# Patient Record
Sex: Female | Born: 1970 | Race: Black or African American
Health system: Southern US, Community
[De-identification: ages and names within clinical notes are randomized; demographics above are authoritative.]

## PROBLEM LIST (undated history)

## (undated) DIAGNOSIS — K5900 Constipation, unspecified: Secondary | ICD-10-CM

## (undated) DIAGNOSIS — B019 Varicella without complication: Secondary | ICD-10-CM

## (undated) DIAGNOSIS — R011 Cardiac murmur, unspecified: Secondary | ICD-10-CM

## (undated) DIAGNOSIS — K760 Fatty (change of) liver, not elsewhere classified: Secondary | ICD-10-CM

## (undated) DIAGNOSIS — M2550 Pain in unspecified joint: Secondary | ICD-10-CM

## (undated) DIAGNOSIS — L659 Nonscarring hair loss, unspecified: Secondary | ICD-10-CM

## (undated) DIAGNOSIS — G4730 Sleep apnea, unspecified: Secondary | ICD-10-CM

## (undated) DIAGNOSIS — T7840XA Allergy, unspecified, initial encounter: Secondary | ICD-10-CM

## (undated) DIAGNOSIS — M1990 Unspecified osteoarthritis, unspecified site: Secondary | ICD-10-CM

## (undated) DIAGNOSIS — K219 Gastro-esophageal reflux disease without esophagitis: Secondary | ICD-10-CM

## (undated) DIAGNOSIS — E559 Vitamin D deficiency, unspecified: Secondary | ICD-10-CM

## (undated) HISTORY — DX: Cardiac murmur, unspecified: R01.1

## (undated) HISTORY — PX: ESOPHAGOGASTRODUODENOSCOPY: SHX1529

## (undated) HISTORY — DX: Varicella without complication: B01.9

## (undated) HISTORY — DX: Pain in unspecified joint: M25.50

## (undated) HISTORY — PX: COLONOSCOPY: SHX174

## (undated) HISTORY — DX: Vitamin D deficiency, unspecified: E55.9

## (undated) HISTORY — DX: Fatty (change of) liver, not elsewhere classified: K76.0

## (undated) HISTORY — DX: Allergy, unspecified, initial encounter: T78.40XA

## (undated) HISTORY — DX: Constipation, unspecified: K59.00

---

## 2011-12-22 HISTORY — PX: CERVICAL BIOPSY  W/ LOOP ELECTRODE EXCISION: SUR135

## 2015-06-23 ENCOUNTER — Ambulatory Visit (INDEPENDENT_AMBULATORY_CARE_PROVIDER_SITE_OTHER): Payer: Self-pay | Admitting: Cardiology

## 2015-06-30 ENCOUNTER — Ambulatory Visit (INDEPENDENT_AMBULATORY_CARE_PROVIDER_SITE_OTHER): Payer: Self-pay | Admitting: Cardiovascular Disease

## 2015-10-29 ENCOUNTER — Other Ambulatory Visit: Payer: Self-pay | Admitting: Obstetrics and Gynecology

## 2017-03-22 ENCOUNTER — Encounter: Payer: Self-pay | Admitting: Nurse Practitioner

## 2017-03-22 ENCOUNTER — Ambulatory Visit (INDEPENDENT_AMBULATORY_CARE_PROVIDER_SITE_OTHER): Payer: 59 | Admitting: Nurse Practitioner

## 2017-03-22 VITALS — BP 138/88 | HR 83 | Temp 97.9°F | Ht 66.0 in | Wt 230.0 lb

## 2017-03-22 DIAGNOSIS — J209 Acute bronchitis, unspecified: Secondary | ICD-10-CM

## 2017-03-22 DIAGNOSIS — J0140 Acute pansinusitis, unspecified: Secondary | ICD-10-CM | POA: Diagnosis not present

## 2017-03-22 DIAGNOSIS — H1032 Unspecified acute conjunctivitis, left eye: Secondary | ICD-10-CM | POA: Diagnosis not present

## 2017-03-22 DIAGNOSIS — L659 Nonscarring hair loss, unspecified: Secondary | ICD-10-CM | POA: Insufficient documentation

## 2017-03-22 DIAGNOSIS — J040 Acute laryngitis: Secondary | ICD-10-CM

## 2017-03-22 DIAGNOSIS — J309 Allergic rhinitis, unspecified: Secondary | ICD-10-CM | POA: Insufficient documentation

## 2017-03-22 MED ORDER — GUAIFENESIN ER 600 MG PO TB12: 600.0000 mg | ORAL_TABLET | Freq: Two times a day (BID) | ORAL | 0 refills | Status: DC | PRN

## 2017-03-22 MED ORDER — AZITHROMYCIN 250 MG PO TABS: 250.0000 mg | ORAL_TABLET | Freq: Every day | ORAL | 0 refills | Status: DC

## 2017-03-22 MED ORDER — BENZONATATE 100 MG PO CAPS: 100.0000 mg | ORAL_CAPSULE | Freq: Three times a day (TID) | ORAL | 0 refills | Status: DC | PRN

## 2017-03-22 NOTE — Progress Notes (Signed)
Subjective:    Patient ID: Brandi Ortiz, female    DOB: 01-04-1971, 46 y.o.   MRN: [id number]  Patient presents today for establish care (new patient).  URI   This is a new problem. The current episode started in the past 7 days. The problem has been gradually worsening. Associated symptoms include congestion, coughing, ear pain, a plugged ear sensation, rhinorrhea, sinus pain, a sore throat and swollen glands. Pertinent negatives include no abdominal pain, chest pain, diarrhea, dysuria, headaches or wheezing. She has tried increased fluids and steam for the symptoms.    Immunizations: (TDAP, Hep C screen, Pneumovax, Influenza, zoster)  Health Maintenance  Topic Date Due  . HIV Screening  11/10/1987  . Pap Smear  11/09/1993  . Flu Shot  05/23/2017  . Tetanus Vaccine  02/20/2022   Diet:regular.  Weight:  Wt Readings from Last 3 Encounters:  03/22/17 230 lb (104.3 kg)    Exercise:none  Fall Risk: Fall Risk  03/22/2017  Falls in the past year? No   Home Safety:home alone.  Depression/Suicide: Depression screen [facility] 2/9 03/22/2017  Decreased Interest 0  Down, Depressed, Hopeless 0  PHQ - 2 Score 0   Pap Smear (every 3yrs for >21-29 without HPV, every 65yrs for >30-50yrs with HPV):last pap 11/2016 (normal per patient), done by GYN in [place], records requested.  Mammogram (yearly, >64yrs): last done 2017 (normal per patient), patient will schedule another with GYN in [place].  Sexual History (birth control, marital status, STD):single. Not sexually active,   Medications and allergies reviewed with patient and updated if appropriate.  Patient Active Problem List   Diagnosis Date Noted  . Allergic rhinitis 03/22/2017  . Alopecia 03/22/2017    No current outpatient prescriptions on file prior to visit.   No current facility-administered medications on file prior to visit.     Past Medical History:  Diagnosis Date  . Allergy   . Chicken pox   . Heart murmur     Past  Surgical History:  Procedure Laterality Date  . CERVICAL BIOPSY  W/ LOOP ELECTRODE EXCISION  12/22/2011    Social History   Social History  . Marital status: Single    Spouse name: N/A  . Number of children: N/A  . Years of education: N/A   Social History Main Topics  . Smoking status: Never Smoker  . Smokeless tobacco: Never Used  . Alcohol use Yes     Comment: social  . Drug use: No  . Sexual activity: Not Asked   Other Topics Concern  . None   Social History Narrative  . None    Family History  Problem Relation Age of Onset  . Alcohol abuse Father   . Hypertension Brother   . Diabetes Brother   . Diabetes Maternal Aunt   . Diabetes Maternal Uncle   . Diabetes Maternal Grandfather         Review of Systems  HENT: Positive for congestion, ear pain, rhinorrhea, sinus pain and sore throat.   Respiratory: Positive for cough. Negative for wheezing.   Cardiovascular: Negative for chest pain.  Gastrointestinal: Negative for abdominal pain, constipation, diarrhea and heartburn.  Genitourinary: Negative for dysuria, frequency and urgency.  Neurological: Negative for dizziness and headaches.  Psychiatric/Behavioral: Negative for depression, hallucinations, substance abuse and suicidal ideas. The patient is not nervous/anxious and does not have insomnia.     Objective:   Vitals:   03/22/17 1416  BP: 138/88  Pulse: 83  Temp: 97.9 F (36.6  C)    Body mass index is 37.12 kg/m.   Physical Examination:  Physical Exam  Constitutional: She is oriented to person, place, and time and well-developed, well-nourished, and in no distress. No distress.  HENT:  Right Ear: Tympanic membrane, external ear and ear canal normal.  Left Ear: Tympanic membrane, external ear and ear canal normal.  Nose: Mucosal edema and rhinorrhea present. Right sinus exhibits maxillary sinus tenderness and frontal sinus tenderness. Left sinus exhibits maxillary sinus tenderness and frontal  sinus tenderness.  Mouth/Throat: Uvula is midline. No trismus in the jaw. Posterior oropharyngeal erythema present. No oropharyngeal exudate.  Eyes: Conjunctivae and EOM are normal. Pupils are equal, round, and reactive to light. No scleral icterus.  Neck: Normal range of motion. Neck supple. No thyromegaly present.  Cardiovascular: Normal rate, normal heart sounds and intact distal pulses.   Pulmonary/Chest: Effort normal and breath sounds normal. She exhibits no tenderness.  Musculoskeletal: Normal range of motion. She exhibits no edema or tenderness.  Lymphadenopathy:    She has cervical adenopathy.  Neurological: She is alert and oriented to person, place, and time. Gait normal.  Skin: Skin is warm and dry.  Psychiatric: Affect and judgment normal.    ASSESSMENT and PLAN:  Brandi Ortiz was seen today for establish care.  Diagnoses and all orders for this visit:  Acute non-recurrent pansinusitis -     guaiFENesin (MUCINEX) 600 MG 12 hr tablet; Take 1 tablet (600 mg total) by mouth 2 (two) times daily as needed for cough or to loosen phlegm. -     benzonatate (TESSALON) 100 MG capsule; Take 1 capsule (100 mg total) by mouth 3 (three) times daily as needed for cough. -     azithromycin (ZITHROMAX Z-PAK) 250 MG tablet; Take 1 tablet (250 mg total) by mouth daily. Take 2tabs on first day, then 1tab once a day till complete  Acute laryngitis -     guaiFENesin (MUCINEX) 600 MG 12 hr tablet; Take 1 tablet (600 mg total) by mouth 2 (two) times daily as needed for cough or to loosen phlegm. -     benzonatate (TESSALON) 100 MG capsule; Take 1 capsule (100 mg total) by mouth 3 (three) times daily as needed for cough. -     azithromycin (ZITHROMAX Z-PAK) 250 MG tablet; Take 1 tablet (250 mg total) by mouth daily. Take 2tabs on first day, then 1tab once a day till complete  Acute bronchitis, unspecified organism -     guaiFENesin (MUCINEX) 600 MG 12 hr tablet; Take 1 tablet (600 mg total) by mouth 2  (two) times daily as needed for cough or to loosen phlegm. -     benzonatate (TESSALON) 100 MG capsule; Take 1 capsule (100 mg total) by mouth 3 (three) times daily as needed for cough. -     azithromycin (ZITHROMAX Z-PAK) 250 MG tablet; Take 1 tablet (250 mg total) by mouth daily. Take 2tabs on first day, then 1tab once a day till complete  Acute conjunctivitis of left eye, unspecified acute conjunctivitis type   No problem-specific Assessment & Plan notes found for this encounter.     Follow up: Return if symptoms worsen or fail to improve.  Wilfred Lacy, NP

## 2017-03-22 NOTE — Patient Instructions (Addendum)
Please sign medical release to get records  And labs from GYN and dermatologist.  URI Instructions:  Encourage adequate oral hydration.  Use over-the-counter  "cold" medicines  such as "Tylenol cold" , "Advil cold",  "Mucinex" or" Mucinex D"  for cough and congestion.  Avoid decongestants if you have high blood pressure. Use" Delsym" or" Robitussin" cough syrup varietis for cough.  You can use plain "Tylenol" or "Advi"l for fever, chills and achyness.  Laryngitis Laryngitis is swelling (inflammation) of your vocal cords. This causes hoarseness, coughing, loss of voice, sore throat, or a dry throat. When your vocal cords are inflamed, your voice sounds different. Laryngitis can be temporary (acute) or long-term (chronic). Most cases of acute laryngitis improve with time. Chronic laryngitis is laryngitis that lasts for more than three weeks. Follow these instructions at home:  Drink enough fluid to keep your pee (urine) clear or pale yellow.  Breathe in moist air. Use a humidifier if you live in a dry climate.  Take medicines only as told by your doctor.  Do not smoke cigarettes or electronic cigarettes. If you need help quitting, ask your doctor.  Talk as little as possible. Also avoid whispering, which can cause vocal strain.  Write instead of talking. Do this until your voice is back to normal. Contact a doctor if:  You have a fever.  Your pain is worse.  You have trouble swallowing. Get help right away if:  You cough up blood.  You have trouble breathing. This information is not intended to replace advice given to you by your health care provider. Make sure you discuss any questions you have with your health care provider. Document Released: 09/28/2011 Document Revised: 03/16/2016 Document Reviewed: 03/24/2014 Elsevier Interactive Patient Education  [employer].

## 2017-03-30 ENCOUNTER — Telehealth: Payer: Self-pay | Admitting: Nurse Practitioner

## 2017-03-30 NOTE — Telephone Encounter (Signed)
ROI Fax to [facility]

## 2017-04-12 ENCOUNTER — Telehealth: Payer: Self-pay | Admitting: Nurse Practitioner

## 2017-04-12 NOTE — Telephone Encounter (Signed)
ROI faxed to [facility]

## 2017-04-19 NOTE — Telephone Encounter (Signed)
Rec'd from [facility] forward 13 pages to [employer] NP

## 2017-12-04 ENCOUNTER — Telehealth: Payer: Self-pay

## 2017-12-04 NOTE — Telephone Encounter (Signed)
Pt is requesting to change from Brandi Lacy, NP to Dr. Jonetta Osgood advise/thx dmf   Copied from [place] [phone]. Topic: Appointment Scheduling - Prior Auth Required for Appointment >> Dec 04, 2017  8:47 AM [name], Helene Kelp Jeral Zick wrote: No appointment has been scheduled. Patient is requesting a transfer appointment from [facility] to Dr. Juleen China. Per scheduling protocol, this appointment requires a prior authorization prior to scheduling. Please call patient back, thanks. Route to department's PEC pool.

## 2017-12-04 NOTE — Telephone Encounter (Signed)
Please help call pt and schedule an appt.

## 2017-12-04 NOTE — Telephone Encounter (Signed)
Okay 

## 2017-12-04 NOTE — Telephone Encounter (Signed)
Oh ok, I misread the previous message. Ok to transfer

## 2017-12-04 NOTE — Telephone Encounter (Signed)
Pt is requesting to transfer from [facility] to Dr. Juleen China.  Last office visit with [facility] was 02/2017. Both provider please advise, ok to transfer?

## 2017-12-04 NOTE — Telephone Encounter (Signed)
She already saw me at the [facility] location 02/2017.i do not see that she was a patient of Dr. Juleen China. Am I missing something?

## 2018-03-05 ENCOUNTER — Other Ambulatory Visit: Payer: Self-pay | Admitting: Obstetrics and Gynecology

## 2018-04-15 ENCOUNTER — Ambulatory Visit (INDEPENDENT_AMBULATORY_CARE_PROVIDER_SITE_OTHER): Payer: POS | Admitting: Certified Nurse Midwife

## 2018-04-15 ENCOUNTER — Encounter: Payer: Self-pay | Admitting: Certified Nurse Midwife

## 2018-04-15 VITALS — BP 123/90 | HR 72 | Ht 66.0 in | Wt 240.8 lb

## 2018-04-15 DIAGNOSIS — N760 Acute vaginitis: Secondary | ICD-10-CM | POA: Diagnosis not present

## 2018-04-15 DIAGNOSIS — Z1151 Encounter for screening for human papillomavirus (HPV): Secondary | ICD-10-CM

## 2018-04-15 DIAGNOSIS — Z124 Encounter for screening for malignant neoplasm of cervix: Secondary | ICD-10-CM | POA: Diagnosis not present

## 2018-04-15 DIAGNOSIS — D259 Leiomyoma of uterus, unspecified: Secondary | ICD-10-CM | POA: Insufficient documentation

## 2018-04-15 DIAGNOSIS — Z01419 Encounter for gynecological examination (general) (routine) without abnormal findings: Secondary | ICD-10-CM

## 2018-04-15 DIAGNOSIS — Z113 Encounter for screening for infections with a predominantly sexual mode of transmission: Secondary | ICD-10-CM | POA: Diagnosis not present

## 2018-04-15 DIAGNOSIS — D252 Subserosal leiomyoma of uterus: Secondary | ICD-10-CM

## 2018-04-15 DIAGNOSIS — D250 Submucous leiomyoma of uterus: Secondary | ICD-10-CM

## 2018-04-15 NOTE — Progress Notes (Signed)
Subjective:        Brandi Ortiz is a 47 y.o. female here for a routine exam.  Current complaints: irregular menses, usually has 2-3 cycles per year.  Amenorrhea since January 2019 until last week when she had another period.  Does have a history of fibroids.  Not currently sexually active.  Desires well woman exam.  Reports change in vaginal discharge.  States that her cycles are lasting about 7 days.  Denies menorrhagia or dysmenorrhea.  Desires STD screening exam.    Last TVUS results reviewed in chart from 2017.  Denies hot flashes.  Does have a dermatology exam this afternoon.  Is moving today for 1 year to [place].  Was living here and commuting to [facility].  Personal health questionnaire:  Is patient Ashkenazi Jewish, have a family history of breast and/or ovarian cancer: no Is there a family history of uterine cancer diagnosed at age < 27, gastrointestinal cancer, urinary tract cancer, family member who is a Field seismologist syndrome-associated carrier: no Is the patient overweight and hypertensive, family history of diabetes, personal history of gestational diabetes, preeclampsia or PCOS: obesity Is patient over 27, have PCOS,  family history of premature CHD under age 14, diabetes, smoke, have hypertension or peripheral artery disease:  no At any time, has a partner hit, kicked or otherwise hurt or frightened you?: no Over the past 2 weeks, have you felt down, depressed or hopeless?: no Over the past 2 weeks, have you felt little interest or pleasure in doing things?:no   Gynecologic History Patient's last menstrual period was 04/05/2018 (exact date). Contraception: abstinence Last Pap: 2017. Results were: normal Last mammogram: 2 weeks ago per patient, with normal results ?[facility].   Obstetric History OB History  Gravida Para Term Preterm AB Living  1       1    SAB TAB Ectopic Multiple Live Births  1            # Outcome Date GA Lbr Len/2nd Weight Sex Delivery Anes PTL Lv   1 SAB 2009            Past Medical History:  Diagnosis Date  . Allergy   . Chicken pox   . Heart murmur     Past Surgical History:  Procedure Laterality Date  . CERVICAL BIOPSY  W/ LOOP ELECTRODE EXCISION  12/22/2011     Current Outpatient Medications:  .  clotrimazole (LOTRIMIN) 1 % cream, Apply 1 application topically 2 (two) times daily., Disp: , Rfl:  .  fexofenadine (ALLEGRA) 180 MG tablet, Take 180 mg by mouth daily., Disp: , Rfl:  .  fluticasone (FLONASE) 50 MCG/ACT nasal spray, Place into both nostrils daily., Disp: , Rfl:  .  Glucosamine HCl (GLUCOSAMINE PO), Take by mouth., Disp: , Rfl:  .  tacrolimus (PROTOPIC) 0.1 % ointment, Apply topically 2 (two) times daily., Disp: , Rfl:  .  Ascorbic Acid (VITAMIN C PO), Take by mouth., Disp: , Rfl:  .  clobetasol (TEMOVATE) 0.05 % GEL, Apply topically 2 (two) times daily., Disp: , Rfl:  .  Multiple Vitamins-Minerals (MULTIVITAL PO), Take by mouth., Disp: , Rfl:  No Active Allergies  Social History   Tobacco Use  . Smoking status: Never Smoker  . Smokeless tobacco: Never Used  Substance Use Topics  . Alcohol use: Yes    Comment: social    Family History  Problem Relation Age of Onset  . Alcohol abuse Father   . Hypertension Brother   .  Diabetes Brother   . Diabetes Maternal Aunt   . Diabetes Maternal Uncle   . Diabetes Maternal Grandfather   . Diabetes Mother       Review of Systems  Constitutional: negative for fatigue and weight loss Respiratory: negative for cough and wheezing Cardiovascular: negative for chest pain, fatigue and palpitations Gastrointestinal: negative for abdominal pain and change in bowel habits Musculoskeletal:negative for myalgias Neurological: negative for gait problems and tremors Behavioral/Psych: negative for abusive relationship, depression Endocrine: negative for temperature intolerance    Genitourinary:negative for abnormal menstrual periods, genital lesions, hot flashes, sexual  problems and vaginal discharge Integument/breast: negative for breast lump, breast tenderness, nipple discharge and skin lesion(s)    Objective:       BP 123/90   Pulse 72   Ht 5\' 6"  (1.676 m)   Wt 240 lb 12.8 oz (109.2 kg)   LMP 04/05/2018 (Exact Date)   BMI 38.87 kg/m  General:   alert  Skin:   no rash or abnormalities  Lungs:   clear to auscultation bilaterally  Heart:   regular rate and rhythm, S1, S2 normal, no murmur, click, rub or gallop  Breasts:   normal without suspicious masses, skin or nipple changes or axillary nodes  Abdomen:  normal findings: no organomegaly, soft, non-tender and no hernia obtunded  Pelvis:  External genitalia: normal general appearance Urinary system: urethral meatus normal and bladder without fullness, nontender Vaginal: normal without tenderness, induration or masses Cervix: normal appearance Adnexa: normal bimanual exam Uterus: anteverted and non-tender, normal size   Lab Review Urine pregnancy test Labs reviewed yes Radiologic studies reviewed yes  50% of 30 min visit spent on counseling and coordination of care.   Assessment & Plan    Healthy female exam.    1. Well woman exam    - Cytology - PAP - Vitamin D (25 hydroxy) - CBC - TSH - Hemoglobin A1c  2. Acute vaginitis    - Cervicovaginal ancillary only  3. Screening examination for STD (sexually transmitted disease)    - HIV antibody - RPR - Hepatitis B surface antigen - Hepatitis C antibody  4. Submucous and subserous leiomyoma of uterus     5. Morbid obesity ([place])    - Hemoglobin A1c   Education reviewed: calcium supplements, depression evaluation, low fat, low cholesterol diet, safe sex/STD prevention, self breast exams, skin cancer screening and weight bearing exercise. Contraception: abstinence. Follow up in: 1 year.    Orders Placed This Encounter  Procedures  . HIV antibody  . RPR  . Hepatitis B surface antigen  . Hepatitis C antibody  . Vitamin  D (25 hydroxy)  . CBC  . TSH  . Hemoglobin A1c   Need to obtain previous records Possible management options include: surgical options for hx of fibroids if change in periods, education given to this as well.  Follow up as needed.

## 2018-04-15 NOTE — Progress Notes (Signed)
Last MMG 2 months ago, normal per pt.

## 2018-04-16 LAB — HEMOGLOBIN A1C
Est. average glucose Bld gHb Est-mCnc: 82 mg/dL
HEMOGLOBIN A1C: 4.5 % — AB (ref 4.8–5.6)

## 2018-04-16 LAB — CBC
HEMATOCRIT: 42.1 % (ref 34.0–46.6)
Hemoglobin: 13.7 g/dL (ref 11.1–15.9)
MCH: 27.8 pg (ref 26.6–33.0)
MCHC: 32.5 g/dL (ref 31.5–35.7)
MCV: 86 fL (ref 79–97)
Platelets: 236 10*3/uL (ref 150–450)
RBC: 4.92 x10E6/uL (ref 3.77–5.28)
RDW: 15.4 % (ref 12.3–15.4)
WBC: 7.5 10*3/uL (ref 3.4–10.8)

## 2018-04-16 LAB — CERVICOVAGINAL ANCILLARY ONLY
BACTERIAL VAGINITIS: POSITIVE — AB
CANDIDA VAGINITIS: NEGATIVE
Chlamydia: NEGATIVE
Neisseria Gonorrhea: NEGATIVE
TRICH (WINDOWPATH): NEGATIVE

## 2018-04-16 LAB — HEPATITIS C ANTIBODY

## 2018-04-16 LAB — VITAMIN D 25 HYDROXY (VIT D DEFICIENCY, FRACTURES): VIT D 25 HYDROXY: 29.1 ng/mL — AB (ref 30.0–100.0)

## 2018-04-16 LAB — HEPATITIS B SURFACE ANTIGEN: Hepatitis B Surface Ag: NEGATIVE

## 2018-04-16 LAB — TSH: TSH: 1.83 u[IU]/mL (ref 0.450–4.500)

## 2018-04-16 LAB — HIV ANTIBODY (ROUTINE TESTING W REFLEX): HIV Screen 4th Generation wRfx: NONREACTIVE

## 2018-04-16 LAB — RPR: RPR Ser Ql: NONREACTIVE

## 2018-04-17 ENCOUNTER — Other Ambulatory Visit: Payer: Self-pay | Admitting: Certified Nurse Midwife

## 2018-04-17 DIAGNOSIS — N760 Acute vaginitis: Principal | ICD-10-CM

## 2018-04-17 DIAGNOSIS — B9689 Other specified bacterial agents as the cause of diseases classified elsewhere: Secondary | ICD-10-CM

## 2018-04-17 DIAGNOSIS — E559 Vitamin D deficiency, unspecified: Secondary | ICD-10-CM | POA: Insufficient documentation

## 2018-04-17 LAB — CYTOLOGY - PAP
Diagnosis: NEGATIVE
HPV (WINDOPATH): NOT DETECTED

## 2018-04-17 MED ORDER — TINIDAZOLE 500 MG PO TABS: 2.0000 g | ORAL_TABLET | Freq: Every day | ORAL | 0 refills | Status: DC

## 2018-04-17 MED ORDER — VITAMIN D (ERGOCALCIFEROL) 1.25 MG (50000 UNIT) PO CAPS: 50000.0000 [IU] | ORAL_CAPSULE | ORAL | 2 refills | Status: DC

## 2018-04-18 ENCOUNTER — Other Ambulatory Visit: Payer: Self-pay | Admitting: *Deleted

## 2018-04-18 DIAGNOSIS — N760 Acute vaginitis: Principal | ICD-10-CM

## 2018-04-18 DIAGNOSIS — B9689 Other specified bacterial agents as the cause of diseases classified elsewhere: Secondary | ICD-10-CM

## 2018-04-18 DIAGNOSIS — E559 Vitamin D deficiency, unspecified: Secondary | ICD-10-CM

## 2018-04-18 MED ORDER — TINIDAZOLE 500 MG PO TABS: 2.0000 g | ORAL_TABLET | Freq: Every day | ORAL | 0 refills | Status: DC

## 2018-04-18 MED ORDER — VITAMIN D (ERGOCALCIFEROL) 1.25 MG (50000 UNIT) PO CAPS: 50000.0000 [IU] | ORAL_CAPSULE | ORAL | 2 refills | Status: DC

## 2018-04-18 NOTE — Progress Notes (Signed)
Pt called to office stating that the pharmacy that we have sent Rx's to is not out of business. Pt would like Rx's sent to another pharmacy. Rx's have been reordered.

## 2018-04-19 ENCOUNTER — Telehealth: Payer: Self-pay

## 2018-04-19 ENCOUNTER — Other Ambulatory Visit: Payer: Self-pay

## 2018-04-19 ENCOUNTER — Other Ambulatory Visit: Payer: Self-pay | Admitting: Obstetrics

## 2018-04-19 DIAGNOSIS — N760 Acute vaginitis: Secondary | ICD-10-CM

## 2018-04-19 DIAGNOSIS — B9689 Other specified bacterial agents as the cause of diseases classified elsewhere: Secondary | ICD-10-CM

## 2018-04-19 DIAGNOSIS — E559 Vitamin D deficiency, unspecified: Secondary | ICD-10-CM

## 2018-04-19 MED ORDER — TINIDAZOLE 500 MG PO TABS: 2.0000 g | ORAL_TABLET | Freq: Every day | ORAL | 0 refills | Status: DC

## 2018-04-19 MED ORDER — VITAMIN D (ERGOCALCIFEROL) 1.25 MG (50000 UNIT) PO CAPS: 50000.0000 [IU] | ORAL_CAPSULE | ORAL | 2 refills | Status: DC

## 2018-04-19 NOTE — Progress Notes (Signed)
These Rxs are from [place], and the transmission failed.  Please ask Apolonio Schneiders to call in these Rxs.  I tried to e-scribe them for her and they also failed to go through electronically.

## 2018-04-19 NOTE — Telephone Encounter (Signed)
S/w pharmacy about rx not going through, was informed that their prescriptions are being transferred to another location. Sent rx with provider approval.

## 2018-04-23 ENCOUNTER — Encounter: Payer: Self-pay | Admitting: Certified Nurse Midwife

## 2018-04-27 ENCOUNTER — Encounter: Payer: Self-pay | Admitting: Certified Nurse Midwife

## 2018-11-13 LAB — LAB REPORT - SCANNED
EGFR (African American): 85
Free T4: 6.1 ng/dL

## 2018-11-14 LAB — CBC AND DIFFERENTIAL
HCT: 41 (ref 36–46)
Hemoglobin: 14 (ref 12.0–16.0)
Neutrophils Absolute: 3255
Platelets: 234 10*3/uL (ref 150–400)
WBC: 5.7

## 2018-11-14 LAB — HEPATIC FUNCTION PANEL
ALT: 49 U/L — AB (ref 7–35)
AST: 35 (ref 13–35)
Alkaline Phosphatase: 150 — AB (ref 25–125)
Bilirubin, Direct: 0.1
Bilirubin, Total: 0.7

## 2018-11-14 LAB — LIPID PANEL
Cholesterol: 160 (ref 0–200)
HDL: 59 (ref 35–70)
LDL Cholesterol: 83
Triglycerides: 90 (ref 40–160)

## 2018-11-14 LAB — TSH: TSH: 2.02 (ref 0.41–5.90)

## 2018-11-14 LAB — CBC: RBC: 4.94 (ref 3.87–5.11)

## 2018-11-14 LAB — BASIC METABOLIC PANEL
BUN: 12 (ref 4–21)
CO2: 23 — AB (ref 13–22)
Chloride: 104 (ref 99–108)
Creatinine: 0.9 (ref 0.5–1.1)
Glucose: 103
Potassium: 4.5 meq/L (ref 3.5–5.1)
Sodium: 138 (ref 137–147)

## 2018-11-14 LAB — IRON,TIBC AND FERRITIN PANEL: Iron: 74

## 2018-11-14 LAB — COMPREHENSIVE METABOLIC PANEL
Albumin: 4.4 (ref 3.5–5.0)
Calcium: 9.1 (ref 8.7–10.7)
Globulin: 2.7
eGFR: 85

## 2018-11-14 LAB — HEMOGLOBIN A1C: Hemoglobin A1C: 4.6

## 2018-12-09 LAB — HEMOGLOBIN A1C: A1c: 4.6

## 2018-12-22 DIAGNOSIS — K219 Gastro-esophageal reflux disease without esophagitis: Secondary | ICD-10-CM | POA: Insufficient documentation

## 2018-12-22 DIAGNOSIS — R011 Cardiac murmur, unspecified: Secondary | ICD-10-CM | POA: Insufficient documentation

## 2018-12-22 DIAGNOSIS — R7989 Other specified abnormal findings of blood chemistry: Secondary | ICD-10-CM | POA: Insufficient documentation

## 2019-02-13 DIAGNOSIS — K6389 Other specified diseases of intestine: Secondary | ICD-10-CM | POA: Insufficient documentation

## 2019-02-13 DIAGNOSIS — K8020 Calculus of gallbladder without cholecystitis without obstruction: Secondary | ICD-10-CM | POA: Insufficient documentation

## 2019-02-13 DIAGNOSIS — K760 Fatty (change of) liver, not elsewhere classified: Secondary | ICD-10-CM | POA: Insufficient documentation

## 2019-04-23 ENCOUNTER — Other Ambulatory Visit: Payer: Self-pay

## 2019-04-23 ENCOUNTER — Encounter: Payer: Self-pay | Admitting: Family Medicine

## 2019-04-23 ENCOUNTER — Ambulatory Visit (INDEPENDENT_AMBULATORY_CARE_PROVIDER_SITE_OTHER): Payer: POS | Admitting: Family Medicine

## 2019-04-23 DIAGNOSIS — M79645 Pain in left finger(s): Secondary | ICD-10-CM

## 2019-04-23 MED ORDER — DICLOFENAC SODIUM 1 % TD GEL: 4.0000 g | Freq: Four times a day (QID) | TRANSDERMAL | 6 refills | Status: DC | PRN

## 2019-04-23 NOTE — Patient Instructions (Signed)
    Other things to help arthritic joints:   Turmeric 500 mg twice daily  Minimize sugar intake

## 2019-04-23 NOTE — Progress Notes (Signed)
Office Visit Note   Patient: Brandi Ortiz           Date of Birth: 1971-04-03           MRN: [id number] Visit Date: 04/23/2019 Requested by: Levin Bacon, NP [address],  [place] [address] PCP: Levin Bacon, NP  Subjective: Chief Complaint  Patient presents with  . Left Middle Finger - Pain    Knot on finger and pain with gripping/picking up objects x 2 months. NKI. Right-hand dominant.    HPI: She is a 48 year old right-hand-dominant female with left third finger pain.  She noticed it about 2 months ago, no injury.  Due to COVID-19 she has been working mainly from home.  As a result, she has had time to do a lot more yard work.  She showed me pictures of all she has done.  She wonders whether this might have contributed to her pain.  She feels a knot on the radial side of her third finger DIP joint, which is painful when she flexes her finger.  Denies any numbness, denies any triggering.  She has not taken medication for her pain because is not constant.  No previous problems with her finger.  No family history of rheumatologic disease to her knowledge.  She is otherwise been in good health.  She works for [employer].                ROS: No fevers or chills.  All other systems were reviewed and are negative.  Objective: Vital Signs: There were no vitals taken for this visit.  Physical Exam:  General:  Alert and oriented, in no acute distress. Pulm:  Breathing unlabored. Psy:  Normal mood, congruent affect. Skin: No rash Left hand: Her third finger has full flexion and extension compared to the right.  There is a palpable nodule on the radial side of the DIP joint, it does not move.  No pain with resisted strength testing of her finger, no laxity of the collateral ligaments.  Imaging: Limited diagnostic ultrasound left third finger: There is a dorsal spur at the DIP joint on the distal phalanx.  She has a small joint effusion which is more prominent on the radial  side of the joint.  I do not see a well-defined ganglion cyst.  No hyperemia on power Doppler imaging.   Assessment & Plan: 1.  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 -If symptoms persist she will come in for x-rays to confirm osteoarthritis, and then possibly be referred to hand therapy.     Procedures: No procedures performed  No notes on file     PMFS History: Patient Active Problem List   Diagnosis Date Noted  . Vitamin D deficiency 04/17/2018  . Uterine fibroid 04/15/2018  . Allergic rhinitis 03/22/2017  . Alopecia 03/22/2017   Past Medical History:  Diagnosis Date  . Allergy   . Chicken pox   . Heart murmur     Family History  Problem Relation Age of Onset  . Alcohol abuse Father   . Hypertension Brother   . Diabetes Brother   . Diabetes Maternal Aunt   . Diabetes Maternal Uncle   . Diabetes Maternal Grandfather   . Diabetes Mother     Past Surgical History:  Procedure Laterality Date  . CERVICAL  BIOPSY  W/ LOOP ELECTRODE EXCISION  12/22/2011   Social History   Occupational History  . Not on file  Tobacco Use  . Smoking status: Never Smoker  . Smokeless tobacco: Never Used  Substance and Sexual Activity  . Alcohol use: Yes    Comment: social  . Drug use: No  . Sexual activity: Yes    Birth control/protection: Condom

## 2019-05-01 ENCOUNTER — Ambulatory Visit (INDEPENDENT_AMBULATORY_CARE_PROVIDER_SITE_OTHER): Payer: POS | Admitting: Family Medicine

## 2019-05-01 ENCOUNTER — Encounter: Payer: Self-pay | Admitting: Family Medicine

## 2019-05-01 VITALS — BP 122/89 | HR 80 | Ht 66.0 in | Wt 230.3 lb

## 2019-05-01 DIAGNOSIS — L2082 Flexural eczema: Secondary | ICD-10-CM

## 2019-05-01 DIAGNOSIS — Z01419 Encounter for gynecological examination (general) (routine) without abnormal findings: Secondary | ICD-10-CM

## 2019-05-01 DIAGNOSIS — Z0000 Encounter for general adult medical examination without abnormal findings: Secondary | ICD-10-CM

## 2019-05-01 DIAGNOSIS — B354 Tinea corporis: Secondary | ICD-10-CM

## 2019-05-01 MED ORDER — KETOCONAZOLE 2 % EX CREA: TOPICAL_CREAM | Freq: Every day | CUTANEOUS | 1 refills | Status: DC | PRN

## 2019-05-01 MED ORDER — FLUTICASONE PROPIONATE 0.05 % EX CREA: TOPICAL_CREAM | Freq: Every day | CUTANEOUS | 1 refills | Status: DC | PRN

## 2019-05-01 MED ORDER — NYSTATIN 100000 UNIT/GM EX POWD: CUTANEOUS | 1 refills | Status: DC

## 2019-05-01 MED ORDER — CLOBETASOL PROPIONATE 0.05 % EX GEL: 1.0000 "application " | Freq: Every day | CUTANEOUS | 1 refills | Status: DC | PRN

## 2019-05-01 NOTE — Progress Notes (Signed)
Pt is in the office for annual. Last pap 04-15-2018. Pt reports last menstrual cycle was in June 2019.

## 2019-05-01 NOTE — Patient Instructions (Signed)
   Keep up the great work with diet and exercise!  You will need to return when you are 28 for a Pap smear and mammogram  Find a primary care doctor in the meantime to see once a year  If you have any abnormal vaginal bleeding, please return to see [place].

## 2019-05-01 NOTE — Progress Notes (Signed)
GYNECOLOGY OFFICE VISIT NOTE History:  48 y.o. G1P0010 with PMH of Vitamin D deficiency, gallbladder stones, GERD, uterine fibroid, Vitamin D deficiency and NAFLD who presents for well-woman visit. She denies any abnormal vaginal discharge, bleeding, pelvic pain or other concerns.   Healthcare Maintenance  - states last Pap normal about year ago, had co-testing and was normal - LMP 6/19 - not had any menopausal symptoms - has done mammograms in the past - started in 40s, never had abnormal, no family history of breast cancer - had job physical in January - BP, EKG, labs, A1C 4 - had lab testing recently with GI issues  - lost 20lbs with diet and exercise - running - related to NAFLD, trying to improve numbers  - has PCP but doesn't see regularly   Rash - sees a dermatologist and has used different creams in the past to help with it - itchy, worse with sweating  - tries to wear moisture-wicking clothing - uses clobetasol, fluticasone, ketoconazole - deodorant is irritating   The following portions of the patient's history were reviewed and updated as appropriate: allergies, current medications, past family history, past medical history, past social history, past surgical history and problem list.   Review of Systems:  Pertinent items noted in HPI Review of Systems  Constitutional: Negative for chills, fever and malaise/fatigue.  HENT: Negative for congestion and sore throat.   Respiratory: Negative for shortness of breath.   Cardiovascular: Negative for chest pain and leg swelling.  Gastrointestinal: Positive for constipation. Negative for abdominal pain and diarrhea.  Genitourinary: Positive for frequency. Negative for dysuria.  Skin: Positive for itching and rash.  Neurological: Negative for dizziness and headaches.  Psychiatric/Behavioral: Negative for depression. The patient is not nervous/anxious and does not have insomnia.    Objective:  Physical Exam BP 122/89   Pulse  80   Ht 5\' 6"  (1.676 m)   Wt 230 lb 4.8 oz (104.5 kg)   LMP 03/23/2018   BMI 37.17 kg/m  Physical Exam  Constitutional: She is oriented to person, place, and time. She appears well-developed and well-nourished. No distress.  HENT:  Head: Normocephalic and atraumatic.  Eyes: Conjunctivae and EOM are normal.  Neck: Neck supple. No thyromegaly present.  Cardiovascular: Normal rate and intact distal pulses.  Pulmonary/Chest: Effort normal. No respiratory distress. Right breast exhibits no inverted nipple, no mass, no nipple discharge, no skin change and no tenderness. Left breast exhibits no inverted nipple, no mass, no nipple discharge, no skin change and no tenderness. Breasts are symmetrical.  Dense, fibrous breasts but no discrete lumps palpated   Musculoskeletal:        General: No edema.  Neurological: She is alert and oriented to person, place, and time.  Skin: Skin is warm and dry. Rash (erythematous, hyperpigmented papules under arms, breasts and in groin) noted.  Psychiatric: She has a normal mood and affect. Her behavior is normal.  Nursing note and vitals reviewed.   Assessment & Plan:  48yo G1P0010 with PMH of allergic rhinitis, gallstones, GERD, NAFLD, uterine fibroid, BMI 37, vitamin D deficiency who presents for well-woman visit.   Well-Woman Visit -- Routine preventative health maintenance measures emphasized. -- Please refer to After Visit Summary for other counseling recommendations.  -- encouraged continued healthy diet and exercise -- encouraged annual flu shot -- UTD on Pap smear - discussed need in 4 years since had co-testing  -- shared decision regarding mammograms and will plan to continue screening at age 5,  normal breast exam today  -- counseled on menopause since > 1 year since LMP, advised to return for any abnormal bleeding   Tinea Corporis, Eczema -- refilled ketoconazole, fluticasone and clobetasol - counseled on use -- Rx for Nystatin powder trial    Total face-to-face time with patient: 20 minutes.  Over 50% of encounter was spent on counseling and coordination of care.  Lambert Mody. Juleen China, DO OB Family Medicine Fellow, [facility] for [employer], [place]

## 2019-05-13 ENCOUNTER — Other Ambulatory Visit: Payer: Self-pay | Admitting: *Deleted

## 2019-05-13 DIAGNOSIS — Z20822 Contact with and (suspected) exposure to covid-19: Secondary | ICD-10-CM

## 2019-05-14 ENCOUNTER — Other Ambulatory Visit: Payer: Self-pay

## 2019-05-14 DIAGNOSIS — Z20822 Contact with and (suspected) exposure to covid-19: Secondary | ICD-10-CM

## 2019-05-18 LAB — NOVEL CORONAVIRUS, NAA: SARS-CoV-2, NAA: NOT DETECTED

## 2019-06-23 ENCOUNTER — Encounter

## 2019-10-10 ENCOUNTER — Other Ambulatory Visit: Payer: Self-pay

## 2019-10-10 DIAGNOSIS — Z20822 Contact with and (suspected) exposure to covid-19: Secondary | ICD-10-CM

## 2019-10-11 LAB — NOVEL CORONAVIRUS, NAA: SARS-CoV-2, NAA: NOT DETECTED

## 2020-05-10 ENCOUNTER — Telehealth: Payer: Self-pay | Admitting: General Practice

## 2020-05-10 ENCOUNTER — Encounter: Payer: Self-pay | Admitting: General Practice

## 2020-05-10 NOTE — Telephone Encounter (Signed)
Patient called to get scheduled as new patient with Dr.Cody. I got patient scheduled and mailed out new patient ppw.

## 2020-05-23 ENCOUNTER — Other Ambulatory Visit: Payer: Self-pay

## 2020-05-23 ENCOUNTER — Ambulatory Visit
Admission: EM | Admit: 2020-05-23 | Discharge: 2020-05-23 | Disposition: A | Discharge status: Home / Self Care | Payer: POS | Attending: Emergency Medicine | Admitting: Emergency Medicine

## 2020-05-23 ENCOUNTER — Encounter: Payer: Self-pay | Admitting: Emergency Medicine

## 2020-05-23 DIAGNOSIS — N3001 Acute cystitis with hematuria: Secondary | ICD-10-CM | POA: Insufficient documentation

## 2020-05-23 DIAGNOSIS — N898 Other specified noninflammatory disorders of vagina: Secondary | ICD-10-CM | POA: Diagnosis present

## 2020-05-23 LAB — POCT URINALYSIS DIP (MANUAL ENTRY)
Bilirubin, UA: NEGATIVE
Glucose, UA: NEGATIVE mg/dL
Ketones, POC UA: NEGATIVE mg/dL
Nitrite, UA: NEGATIVE
Protein Ur, POC: 30 mg/dL — AB
Spec Grav, UA: >=1.03 — AB (ref 1.010–1.025)
Urobilinogen, UA: 0.2 E.U./dL
pH, UA: 6 (ref 5.0–8.0)

## 2020-05-23 NOTE — ED Triage Notes (Addendum)
Patient presents to [facility] for assessment of burning with urination since yesterday.  Denies blood, denies abdominal or back pain.  Vaginal pain and discharge noted since yesterday.

## 2020-05-23 NOTE — Discharge Instructions (Addendum)

## 2020-05-23 NOTE — ED Provider Notes (Signed)
[facility]    CSN: [id number] Arrival date & time: 05/23/20  6433      History   Chief Complaint Chief Complaint  Patient presents with  . Dysuria    HPI Brandi Ortiz is a 49 y.o. female with history of allergies presenting for possible UTI.  Endorsing burning with urination since yesterday.  No blood, abdominal pain, back pain, fever.  Patient also notes vaginal discharge and pain.  Does admit to rough intercourse the day preceding symptom onset.  No pelvic pain, bleeding.  Past Medical History:  Diagnosis Date  . Allergy   . Chicken pox   . Heart murmur     Patient Active Problem List   Diagnosis Date Noted  . Calculus of gallbladder without cholecystitis without obstruction 02/13/2019  . 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 Class 2 severe obesity due to excess calories with serious comorbidity and body mass index (BMI) of 38.0 to 38.9 in adult [facility]) 02/13/2019  . NAFLD (nonalcoholic fatty liver disease) 02/13/2019  . Cardiac murmur 12/22/2018  . Elevated LFTs 12/22/2018  . Gastroesophageal reflux disease 12/22/2018  . Vitamin D deficiency 04/17/2018  . Uterine fibroid 04/15/2018  . Allergic rhinitis 03/22/2017  . Alopecia 03/22/2017    Past Surgical History:  Procedure Laterality Date  . CERVICAL BIOPSY  W/ LOOP ELECTRODE EXCISION  12/22/2011    OB History    Gravida  1   Para      Term      Preterm      AB  1   Living        SAB  1   TAB      Ectopic      Multiple      Live Births               Home Medications    Prior to Admission medications   Medication Sig Start Date End Date Taking? Authorizing Provider  Omega-3 Fatty Acids (FISH OIL) 500 MG CAPS Take by mouth.   Yes [provider]  vitamin B-12 (CYANOCOBALAMIN) 500 MCG tablet Take 500 mcg by mouth daily.   Yes [provider]  Ascorbic Acid (VITAMIN C PO) Take by mouth.    [provider]  clobetasol (TEMOVATE) 0.05 % GEL Apply 1  application topically daily as needed. 05/01/19   Glenice Bow, DO  famotidine (PEPCID) 20 MG tablet Take by mouth. 04/20/19   [provider]  fexofenadine (ALLEGRA) 180 MG tablet Take 180 mg by mouth daily.    [provider]  fluticasone (CUTIVATE) 0.05 % cream Apply topically daily as needed. 05/01/19   Glenice Bow, DO  fluticasone (FLONASE) 50 MCG/ACT nasal spray Place into both nostrils daily.    [provider]  Glucosamine Sulfate (SYNOVACIN PO) Glucosamine  1 tablet daily    [provider]  Iron-Vitamin C (VITRON-C PO) Take by mouth daily.    [provider]  ketoconazole (NIZORAL) 2 % cream Apply topically daily as needed for irritation. 05/01/19   Glenice Bow, DO  nystatin (MYCOSTATIN/NYSTOP) powder Use 2-3 times per day as needed. 05/01/19   Glenice Bow, DO  Turmeric (QC TUMERIC COMPLEX) 500 MG CAPS Take by mouth 2 (two) times daily.    [provider]    Family History Family History  Problem Relation Age of Onset  . Alcohol abuse Father   . Hypertension Brother   . Diabetes Brother   . Diabetes Maternal Aunt   .  Diabetes Maternal Uncle   . Diabetes Maternal Grandfather   . Diabetes Mother     Social History Social History   Tobacco Use  . Smoking status: Never Smoker  . Smokeless tobacco: Never Used  Vaping Use  . Vaping Use: Never used  Substance Use Topics  . Alcohol use: Yes    Comment: social  . Drug use: No     Allergies   Patient has no known allergies.   Review of Systems As per HPI   Physical Exam Triage Vital Signs ED Triage Vitals  Enc Vitals Group     BP 05/23/20 0948 (!) 151/98     Pulse Rate 05/23/20 0948 68     Resp 05/23/20 0948 18     Temp 05/23/20 0948 98.4 F (36.9 C)     Temp Source 05/23/20 0948 Oral     SpO2 05/23/20 0948 97 %     Weight --      Height --      Head Circumference --      Peak Flow --      Pain Score 05/23/20 0949 3     Pain Loc --       Pain Edu? --      Excl. in [place]? --    No data found.  Updated Vital Signs BP (!) 151/98 (BP Location: Left Arm)   Pulse 68   Temp 98.4 F (36.9 C) (Oral)   Resp 18   LMP 04/05/2018 (Exact Date)   SpO2 97%   Visual Acuity Right Eye Distance:   Left Eye Distance:   Bilateral Distance:    Right Eye Near:   Left Eye Near:    Bilateral Near:     Physical Exam Constitutional:      General: She is not in acute distress. HENT:     Head: Normocephalic and atraumatic.  Eyes:     General: No scleral icterus.    Pupils: Pupils are equal, round, and reactive to light.  Cardiovascular:     Rate and Rhythm: Normal rate.  Pulmonary:     Effort: Pulmonary effort is normal.  Abdominal:     General: Bowel sounds are normal.     Palpations: Abdomen is soft.     Tenderness: There is no abdominal tenderness. There is no right CVA tenderness, left CVA tenderness or guarding.  Genitourinary:    Comments: Patient declined, self-swab performed Skin:    Coloration: Skin is not jaundiced or pale.  Neurological:     Mental Status: She is alert and oriented to person, place, and time.      UC Treatments / Results  Labs (all labs ordered are listed, but only abnormal results are displayed) Labs Reviewed  POCT URINALYSIS DIP (MANUAL ENTRY) - Abnormal; Notable for the following components:      Result Value   Clarity, UA cloudy (*)    Spec Grav, UA >=1.030 (*)    Blood, UA large (*)    Protein Ur, POC =30 (*)    Leukocytes, UA Trace (*)    All other components within normal limits  URINE CULTURE  CERVICOVAGINAL ANCILLARY ONLY    EKG   Radiology No results found.  Procedures Procedures (including critical care time)  Medications Ordered in UC Medications - No data to display  Initial Impression / Assessment and Plan / UC Course  I have reviewed the triage vital signs and the nursing notes.  Pertinent labs & imaging results that were available  during my care of the  patient were reviewed by me and considered in my medical decision making (see chart for details).     Patient afebrile, nontoxic in office today.  Urine dipstick done in office, reviewed by me: Positive for leukocytes, protein, blood.  Urine culture pending.  H&P most consistent with acute vaginitis dysuria second to rough intercourse.  Will await cytology and urine culture prior to treating.  Return precautions discussed, patient verbalized understanding and is agreeable to plan. Final Clinical Impressions(s) / UC Diagnoses   Final diagnoses:  Acute cystitis with hematuria  Vaginal discharge     Discharge Instructions     Testing for chlamydia, gonorrhea, trichomonas is pending: please look for these results on the MyChart app/website.  We will notify you if you are positive and outline treatment at that time.  Important to avoid all forms of sexual intercourse (oral, vaginal, anal) with any/all partners for the next 7 days to avoid spreading/reinfecting. Any/all sexual partners should be notified of testing/treatment today.  Return for persistent/worsening symptoms or if you develop fever, abdominal or pelvic pain, discharge, genital pain, blood in your urine, or are re-exposed to an STI.    ED Prescriptions    None     PDMP not reviewed this encounter.   [place], [place], [place] 05/23/20 1128

## 2020-05-25 LAB — URINE CULTURE: Culture: <10000 — AB

## 2020-05-25 LAB — CERVICOVAGINAL ANCILLARY ONLY
Chlamydia: NEGATIVE
Comment: NEGATIVE
Comment: NEGATIVE
Comment: NORMAL
Neisseria Gonorrhea: NEGATIVE
Trichomonas: NEGATIVE

## 2020-06-29 ENCOUNTER — Encounter: Payer: Self-pay | Admitting: Family Medicine

## 2020-07-02 ENCOUNTER — Other Ambulatory Visit: Payer: Self-pay

## 2020-07-02 ENCOUNTER — Other Ambulatory Visit: Payer: POS

## 2020-07-02 DIAGNOSIS — Z20822 Contact with and (suspected) exposure to covid-19: Secondary | ICD-10-CM

## 2020-07-05 ENCOUNTER — Other Ambulatory Visit: Payer: Self-pay

## 2020-07-05 ENCOUNTER — Encounter: Payer: Self-pay | Admitting: Family Medicine

## 2020-07-05 ENCOUNTER — Ambulatory Visit: Payer: POS | Admitting: Family Medicine

## 2020-07-05 VITALS — BP 130/80 | HR 70 | Temp 97.3°F | Ht 65.0 in | Wt 240.8 lb

## 2020-07-05 DIAGNOSIS — E559 Vitamin D deficiency, unspecified: Secondary | ICD-10-CM | POA: Diagnosis not present

## 2020-07-05 DIAGNOSIS — K219 Gastro-esophageal reflux disease without esophagitis: Secondary | ICD-10-CM

## 2020-07-05 DIAGNOSIS — Z23 Encounter for immunization: Secondary | ICD-10-CM

## 2020-07-05 DIAGNOSIS — Z6838 Body mass index (BMI) 38.0-38.9, adult: Secondary | ICD-10-CM | POA: Diagnosis not present

## 2020-07-05 DIAGNOSIS — G4484 Primary exertional headache: Secondary | ICD-10-CM | POA: Diagnosis not present

## 2020-07-05 DIAGNOSIS — L2082 Flexural eczema: Secondary | ICD-10-CM

## 2020-07-05 DIAGNOSIS — B354 Tinea corporis: Secondary | ICD-10-CM

## 2020-07-05 DIAGNOSIS — B372 Candidiasis of skin and nail: Secondary | ICD-10-CM | POA: Insufficient documentation

## 2020-07-05 DIAGNOSIS — M1990 Unspecified osteoarthritis, unspecified site: Secondary | ICD-10-CM | POA: Insufficient documentation

## 2020-07-05 DIAGNOSIS — L309 Dermatitis, unspecified: Secondary | ICD-10-CM | POA: Insufficient documentation

## 2020-07-05 LAB — NOVEL CORONAVIRUS, NAA: SARS-CoV-2, NAA: NOT DETECTED

## 2020-07-05 MED ORDER — SUMATRIPTAN SUCCINATE 25 MG PO TABS: 25.0000 mg | ORAL_TABLET | ORAL | 0 refills | Status: DC | PRN

## 2020-07-05 MED ORDER — KETOCONAZOLE 2 % EX CREA: TOPICAL_CREAM | Freq: Every day | CUTANEOUS | 1 refills | Status: DC | PRN

## 2020-07-05 MED ORDER — INDOMETHACIN 25 MG PO CAPS: 25.0000 mg | ORAL_CAPSULE | ORAL | 0 refills | Status: DC | PRN

## 2020-07-05 MED ORDER — FLUTICASONE PROPIONATE 0.05 % EX CREA: TOPICAL_CREAM | Freq: Every day | CUTANEOUS | 1 refills | Status: AC | PRN

## 2020-07-05 MED ORDER — CLOBETASOL PROPIONATE 0.05 % EX GEL: 1.0000 "application " | Freq: Every day | CUTANEOUS | 1 refills | Status: DC | PRN

## 2020-07-05 NOTE — Assessment & Plan Note (Signed)
Working to be healthy and maintain weight loss.

## 2020-07-05 NOTE — Assessment & Plan Note (Addendum)
Treated with pepcid. Controlled

## 2020-07-05 NOTE — Progress Notes (Signed)
Subjective:     Brandi Ortiz is a 49 y.o. female presenting for Establish Care and Headache     HPI  Lab Results  Component Value Date   HGBA1C 4.5 (L) 04/15/2018   #Headache - HA in the past but not like this - the first 2 times it happened during intercourse - will get symptoms and then lingers - tried drinking more water/pedialyte w/ improvement - got another HA when she was yelling/excited - improved with calming - pressure in the face area - also temple area - throbbing sensation - will try to do pressure points on the face with some improvement - has tried migraine medication w/o success - ibuprofen  - caffeine - only tea - is starting to get mild HA with running  Review of Systems  Eyes: Negative for photophobia.  Gastrointestinal: Negative for nausea and vomiting.     Social History   Tobacco Use  Smoking Status Never Smoker  Smokeless Tobacco Never Used        Objective:    BP Readings from Last 3 Encounters:  07/05/20 130/80  05/23/20 (!) 151/98  05/01/19 122/89   Wt Readings from Last 3 Encounters:  07/05/20 240 lb 12 oz (109.2 kg)  05/01/19 230 lb 4.8 oz (104.5 kg)  04/15/18 240 lb 12.8 oz (109.2 kg)    BP 130/80   Pulse 70   Temp (!) 97.3 F (36.3 C) (Temporal)   Ht 5\' 5"  (1.651 m)   Wt 240 lb 12 oz (109.2 kg)   LMP 04/05/2018 (Exact Date)   SpO2 97%   BMI 40.06 kg/m    Physical Exam Constitutional:      General: She is not in acute distress.    Appearance: She is well-developed. She is not diaphoretic.  HENT:     Head: Normocephalic and atraumatic.     Right Ear: External ear normal.     Left Ear: External ear normal.     Nose: Nose normal.  Eyes:     General: No scleral icterus.    Extraocular Movements: Extraocular movements intact.     Right eye: Normal extraocular motion and no nystagmus.     Left eye: Normal extraocular motion and no nystagmus.     Conjunctiva/sclera: Conjunctivae normal.     Pupils: Pupils  are equal, round, and reactive to light.  Cardiovascular:     Rate and Rhythm: Normal rate and regular rhythm.     Heart sounds: No murmur heard.   Pulmonary:     Effort: Pulmonary effort is normal. No respiratory distress.     Breath sounds: Normal breath sounds. No wheezing.  Musculoskeletal:     Cervical back: Neck supple. No rigidity.  Skin:    General: Skin is warm and dry.     Capillary Refill: Capillary refill takes less than 2 seconds.  Neurological:     Mental Status: She is alert. Mental status is at baseline.     Cranial Nerves: No cranial nerve deficit.     Sensory: No sensory deficit.     Motor: No weakness.     Coordination: Coordination normal.     Deep Tendon Reflexes: Reflexes normal.  Psychiatric:        Mood and Affect: Mood normal.        Behavior: Behavior normal.           Assessment & Plan:   Problem List Items Addressed This Visit      Digestive  Gastroesophageal reflux disease    Treated with pepcid. Controlled        Musculoskeletal and Integument   Arthritis    Treated with glucosamine and tumeric      Relevant Medications   indomethacin (INDOCIN) 25 MG capsule   Eczema    Treats with clobetasol      Relevant Medications   clobetasol (TEMOVATE) 0.05 % GEL   Other Relevant Orders   Ambulatory referral to Dermatology   Ambulatory referral to Dermatology   Candidal dermatitis   Relevant Medications   ketoconazole (NIZORAL) 2 % cream     Other   Vitamin D deficiency    Repeat, as hx of low vit d      Relevant Orders   Vitamin D, 25-hydroxy   Class 2 severe obesity due to excess calories with serious comorbidity and body mass index (BMI) of 38.0 to 38.9 in adult [facility])    Working to be healthy and maintain weight loss.       Relevant Orders   Comprehensive metabolic panel   Lipid panel   TSH   Exertional headache - Primary    New intense HA primarily associated with intercourse or excitement. Discussed abortive therapy  of sumatriptan and prevention therapy of indomethacin 30-60 minutes before intercourse. If no response or continuing f/u. Also advised HA diary to see if she can identify any food triggers which may cause worsening symptoms.       Relevant Medications   SUMAtriptan (IMITREX) 25 MG tablet   indomethacin (INDOCIN) 25 MG capsule   Other Relevant Orders   CBC   TSH   Ferritin    Other Visit Diagnoses    Tinea corporis       Relevant Medications   clobetasol (TEMOVATE) 0.05 % GEL   fluticasone (CUTIVATE) 0.05 % cream   ketoconazole (NIZORAL) 2 % cream   Other Relevant Orders   Ambulatory referral to Dermatology   Need for influenza vaccination       Relevant Orders   Flu Vaccine QUAD 36+ mos IM (Completed)       Return in about 1 year (around 07/05/2021), or if symptoms worsen or fail to improve.  Lesleigh Noe, MD  This visit occurred during the SARS-CoV-2 public health emergency.  Safety protocols were in place, including screening questions prior to the visit, additional usage of staff PPE, and extensive cleaning of exam room while observing appropriate contact time as indicated for disinfecting solutions.

## 2020-07-05 NOTE — Assessment & Plan Note (Signed)
Repeat, as hx of low vit d

## 2020-07-05 NOTE — Patient Instructions (Signed)
Indomethacin-Responsive Headache, Adult An indomethacin-responsive headache is a headache that gets better when you take indomethacin. Indomethacin is a kind of NSAID (nonsteroidal anti-inflammatory drug). Indomethacin can quickly stop the pain from some kinds of headaches, such as:  Paroxysmal hemicrania. This is a series of short, severe headaches, usually on just one side of the head.  Hemicrania continua. Pain is nonstop and on one side of the face.  Primary exertional headache. Exercise sets off these headaches.  Primary cough headache. Pain may come from pressure in the brain when coughing or straining. What are the causes? The exact cause of this condition is not known. Certain conditions may start (trigger) a headache. They include:  Moving the head in certain ways.  Stress.  Pressure on sensitive areas of the neck.  Drinking alcohol.  Exercise.  Coughing and sneezing. What increases the risk? The following factors may make you more likely to develop this condition:  Being 47 years of age or older.  Having a serious head injury.  Having migraine headaches.  Having a family history of this condition. What are the signs or symptoms? Symptoms of this condition depend on the kind of headache you have.  Paroxysmal hemicrania: ? Having about 10 headaches a day. Each may last from a few minutes to 2 hours. ? Severe, pounding pain. ? Pain usually on just one side of the head. It often centers around the eye or in the forehead. ? A watery eye, which may become red or swollen. ? A droopy or swollen eyelid. ? Sweating and having a red or pinkish face. ? A stuffy, runny nose.  Hemicrania continua: ? All-day headache. This may occur daily for at least 3 months. Then there may be no headaches for weeks or months. ? Pain that gets worse several times during the day. ? Pain in the face, on one side only. It almost always occurs on the same side. ? A watery eye. It may also  become droopy, red, and swollen. ? A stuffy, runny nose. ? Pain that gets worse with sound or light.  Primary exertional headache: ? Pain during physical activity. ? Pounding or throbbing pain. ? Pain that lasts for 5 minutes to 48 hours, or sometimes longer.  Primary cough headache: ? Pain that starts after coughing, sneezing, or straining. ? Sharp, stabbing pain. ? Pain on both sides of the head. It is often worse in the back of the head. ? Pain that is severe for a few minutes and then dull for several hours. How is this diagnosed? This condition may be diagnosed based on:  Symptoms and medical history. Your health care provider will ask you questions about your headaches.  Physical exam.  Tests that may include: ? Blood and urine tests. ? Spinal tap (lumbar puncture). This tests a sample of fluid from your spine. The test checks for infection, bleeding in your brain (brain hemorrhage), or extra pressure inside your skull. ? Ultrasound, MRI, CT scan, or other imaging tests. If no medical condition is causing your headaches, you will be given indomethacin. If yours is an indomethacin-responsive headache, your symptoms should go away quickly. How is this treated?  By taking indomethacin.  With other medicines: ? To prevent or treat stomach ulcers. ? To relieve stomachache or heartburn (antacids). ? For nausea. Follow these instructions at home: Lifestyle  Rest in a dark, quiet room.  Put a cool, damp washcloth on your head or face.  Get plenty of sleep. Most adults should get  at least 7-9 hours of sleep each night.  Eat on a regular schedule. Do not skip meals.  Limit alcohol intake to no more than 1 drink per day for non-pregnant women and 2 drinks per day for men. One drink equals 12 ounces of beer, 5 ounces of wine, or 1 ounces of hard liquor.  Do not use any products that contain nicotine or tobacco, such as cigarettes and e-cigarettes. If you need help quitting,  ask your health care provider. Headache diary Keep a headache diary. This will help you and your health care provider determine what is triggering your headaches. Each time you have a headache, write down:  When it started and stopped. Include the day and time.  How it felt.  Any triggers, such as noise, stress, or foods.  Any medicines you took.  General instructions  Take over-the-counter and prescription medicines only as told by your health care provider. ? Do not take other NSAIDs, such as ibuprofen, with indomethacin.  Tell your health care provider about all medicines you are taking, including vitamins, herbs, eye drops, creams, and over-the-counter medicines.  Keep all follow-up visits as told by your health care provider. This is important. Contact a health care provider if:  Your pain continues even with treatment.  You have nausea.  You have a fever. Get help right away if:  You have bad stomach pain or vomiting.  You vomit blood.  You have blood in your stool.  You have chest pain.  You have any symptoms of a stroke. "BE FAST" is an easy way to remember the main warning signs of a stroke: ? B - Balance. Signs are dizziness, sudden trouble walking, or loss of balance. ? E - Eyes. Signs are trouble seeing or a sudden change in vision. ? F - Face. Signs are sudden weakness or numbness of the face, or the face or eyelid drooping on one side. ? A - Arms. Signs are weakness or numbness in an arm. This happens suddenly and usually on one side of the body. ? S - Speech. Signs are sudden trouble speaking, slurred speech, or trouble understanding what people say. ? T - Time. Time to call emergency services. Write down what time symptoms started.  You have other signs of a stroke, such as: ? A sudden, severe headache. ? Nausea or vomiting. ? Seizure. Summary  An indomethacin-responsive headache is a headache that gets better when you take indomethacin, a medicine  that stops inflammation.  The exact cause of this condition is not known, but there are certain conditions that may start (trigger) a headache.  Keep a headache diary to help your health care provider determine your triggers.  Treatment of this condition includes indomethacin, but it may include other medicines to relieve other symptoms. This information is not intended to replace advice given to you by your health care provider. Make sure you discuss any questions you have with your health care provider. Document Revised: 12/02/2018 Document Reviewed: 10/20/2017 Elsevier Patient Education  2020 [employer].

## 2020-07-05 NOTE — Assessment & Plan Note (Signed)
Treats with clobetasol

## 2020-07-05 NOTE — Assessment & Plan Note (Signed)
New intense HA primarily associated with intercourse or excitement. Discussed abortive therapy of sumatriptan and prevention therapy of indomethacin 30-60 minutes before intercourse. If no response or continuing f/u. Also advised HA diary to see if she can identify any food triggers which may cause worsening symptoms.

## 2020-07-05 NOTE — Assessment & Plan Note (Signed)
Treated with glucosamine and tumeric

## 2020-07-06 LAB — COMPREHENSIVE METABOLIC PANEL
ALT: 45 U/L — ABNORMAL HIGH (ref 0–35)
AST: 31 U/L (ref 0–37)
Albumin: 4.5 g/dL (ref 3.5–5.2)
Alkaline Phosphatase: 145 U/L — ABNORMAL HIGH (ref 39–117)
BUN: 19 mg/dL (ref 6–23)
CO2: 30 mEq/L (ref 19–32)
Calcium: 9.2 mg/dL (ref 8.4–10.5)
Chloride: 103 mEq/L (ref 96–112)
Creatinine, Ser: 1.08 mg/dL (ref 0.40–1.20)
GFR: 65.61 mL/min (ref >60.00)
Glucose, Bld: 97 mg/dL (ref 70–99)
Potassium: 4.2 mEq/L (ref 3.5–5.1)
Sodium: 141 mEq/L (ref 135–145)
Total Bilirubin: 1.1 mg/dL (ref 0.2–1.2)
Total Protein: 7.3 g/dL (ref 6.0–8.3)

## 2020-07-06 LAB — LIPID PANEL
Cholesterol: 175 mg/dL (ref 0–200)
HDL: 53.5 mg/dL (ref >39.00)
LDL Cholesterol: 84 mg/dL (ref 0–99)
NonHDL: 121.59
Total CHOL/HDL Ratio: 3
Triglycerides: 188 mg/dL — ABNORMAL HIGH (ref 0.0–149.0)
VLDL: 37.6 mg/dL (ref 0.0–40.0)

## 2020-07-06 LAB — CBC
HCT: 43.5 % (ref 36.0–46.0)
Hemoglobin: 14.3 g/dL (ref 12.0–15.0)
MCHC: 32.9 g/dL (ref 30.0–36.0)
MCV: 85.9 fl (ref 78.0–100.0)
Platelets: 228 10*3/uL (ref 150.0–400.0)
RBC: 5.06 Mil/uL (ref 3.87–5.11)
RDW: 14.1 % (ref 11.5–15.5)
WBC: 8.6 10*3/uL (ref 4.0–10.5)

## 2020-07-06 LAB — TSH: TSH: 0.93 u[IU]/mL (ref 0.35–4.50)

## 2020-07-06 LAB — VITAMIN D 25 HYDROXY (VIT D DEFICIENCY, FRACTURES): VITD: 28.66 ng/mL — ABNORMAL LOW (ref 30.00–100.00)

## 2020-07-06 LAB — FERRITIN: Ferritin: 141.7 ng/mL (ref 10.0–291.0)

## 2020-07-07 ENCOUNTER — Encounter: Payer: Self-pay | Admitting: Family Medicine

## 2020-08-10 ENCOUNTER — Encounter: Payer: Self-pay | Admitting: Skilled Nursing Facility1

## 2020-08-10 ENCOUNTER — Other Ambulatory Visit: Payer: Self-pay

## 2020-08-10 ENCOUNTER — Encounter: Payer: POS | Attending: Family Medicine | Admitting: Skilled Nursing Facility1

## 2020-08-10 DIAGNOSIS — Z6838 Body mass index (BMI) 38.0-38.9, adult: Secondary | ICD-10-CM | POA: Diagnosis present

## 2020-08-10 NOTE — Progress Notes (Signed)
Assessment:  Primary concerns today: weight managment   Pt states she wants to talk about being healthy.  Pt states she has done weight watchers and many other diets.  Pt states she likes exercise and likes running having recently done a 10k. Pt states there is so much information about food out there it is getting confusing and not sure what is true and what is not.  Pt states she knows she does not drink enough throughout the day. Pt states she has a bowel movement with the detox tea 2 times a day and without the tea will not have a movement for that day or 2. Pt states she beleives raw vegetables helps her to have a healthy bowel movement. Pt states her intermittent fasting: fist meal 11am or 12pm stopping at 7pm; up at 6-7am in bed at 11pm.  Pt states NOOM has helped her to make different maybe healthier choices. Pt states she does have a treadmill in her home.    Body Composition Scale 08/10/2020  Current Body Weight 236.5  Total Body Fat % 42.8  Visceral Fat 14  Fat-Free Mass % 57.1   Total Body Water % 43  Muscle-Mass lbs 32.9  BMI 38.1  Body Fat Displacement          Torso  lbs 62.8         Left Leg  lbs 12.5         Right Leg  lbs 12.5         Left Arm  lbs 6.2         Right Arm   lbs 6.2    MEDICATIONS: see list    DIETARY INTAKE:  Usual eating pattern includes 2 meals and 1 snacks per day.  Everyday foods include none stated.  Avoided foods include none stated.    24-hr recall:  B ( AM):   Snk ( AM):  L ( 11-12PM): [place] bacon + raisin toast + fruit or sausage + rasin bread + fruit or [place] meat and cheese roll Snk ( PM): salad + grilled chicken  D ( PM):  Snk ( PM):  Beverages: herbal detox tea  Usual physical activity: running/walking 5 days a week 30 minutes (inconsistant)   Estimated energy needs: 1500 calories  Progress Towards Goal(s):  In progress.    Intervention:  Nutrition cousneling. Dietitian educated pt on a healthy diet within the  context of weight management.  Importance of vegetables To have an overall healthy diet, adult men and women are recommended to consume anywhere from 2-3 cups of vegetables daily. Vegetables provide a wide range of vitamins and minerals such as vitamin A, vitamin C, potassium, and folic acid. According to the [facility], including fruit and vegetables daily may reduce the risk of cardiovascular disease, certain cancers, and other non-communicable diseases. Why should you not wait past 5 hours to eat?: Eating 3-5 hours after your meals will help control blood glucose (sugar) levels and helps control your hunger. Having too large of a gap between meals will cause you to be very hungry and eat too much; this can make you sick if you have had bariatric surgery and/or can cause larger fluctuations in your blood glucose levels if you have diabetes. Those who struggle with hypoglycemia (low blood glucose) will experience more hypoglycemic events if they do not have a consistent meal schedule.   Goals: Aim to eat every 3-5 hours starting within 1-1.5 hours after waking: eating more often throughout  the day will not result in weight gain Aim to eat non starchy vegetables 2 times a day 7 days a week Aim for 64 ounces of tea Aim for 30 minutes of exercise 5 days a week  Teaching Method Utilized:  Visual Auditory Hands on  Handouts given during visit include:  Detailed MyPlate  Barriers to learning/adherence to lifestyle change: contemplative stage of change  Demonstrated degree of understanding via:  Teach Back   Monitoring/Evaluation:  Dietary intake, exercise, and body weight prn.

## 2020-08-20 ENCOUNTER — Encounter: Payer: Self-pay | Admitting: Family Medicine

## 2020-09-13 ENCOUNTER — Other Ambulatory Visit: Payer: Self-pay

## 2020-09-13 ENCOUNTER — Encounter: Payer: POS | Attending: Family Medicine | Admitting: Skilled Nursing Facility1

## 2020-09-13 DIAGNOSIS — E669 Obesity, unspecified: Secondary | ICD-10-CM | POA: Insufficient documentation

## 2020-09-13 NOTE — Progress Notes (Signed)
Assessment:  Primary concerns today: weight managment    Pt states she beleives raw vegetables helps her to have a healthy bowel movement.   Progress: in having breakfast regularly  and trying to get more water. Pt states she eats her vegetables. Pt states she tried to cook vegetables at home like beets. Bag of mixed of salad. Pt states she is okay with her wight and eating pattern. Pt is okay with slow progress because she knows it is coming. Pt states tracking weight everyday help her to understand and likes empirical evidence. Pt states she wants to be able to lift her own body weightt.    Body Composition Scale 08/10/2020 09/13/2020  Current Body Weight 236.5 236.5  Total Body Fat % 42.8 46.7%  Visceral Fat 14 18  Fat-Free Mass % 57.1 53.2   Total Body Water % 43 41.1  Muscle-Mass lbs 32.9 28.4  BMI 38.1   Body Fat Displacement           Torso  lbs 62.8 68.5         Left Leg  lbs 12.5 13.7         Right Leg  lbs 12.5 13.7         Left Arm  lbs 6.2 6.8         Right Arm   lbs 6.2 6.8    MEDICATIONS: see list    DIETARY INTAKE:  Usual eating pattern includes 2 meals and 1 snacks per day.  Everyday foods include none stated.  Avoided foods include none stated.    24-hr recall:  B ( AM):  Oatmeal+resins +sausage+ tea Snk ( AM):  L ( 11-12PM): taco salad (half for lunch and half for dinner) Snk ( PM): salad + grilled chicken  D ( PM): pizza or salad Snk ( PM):  Beverages: herbal detox tea  Usual physical activity: running/walking 5 days a week 30 minutes (inconsistant)   Estimated energy needs: 1500 calories  Progress Towards Goal(s):  In progress.    Intervention:  Nutrition cousneling. Dietitian educated pt on a healthy diet within the context of weight management.  Importance of vegetables To have an overall healthy diet, adult men and women are recommended to consume anywhere from 2-3 cups of vegetables daily. Vegetables provide a wide range of vitamins and  minerals such as vitamin A, vitamin C, potassium, and folic acid. According to the [facility], including fruit and vegetables daily may reduce the risk of cardiovascular disease, certain cancers, and other non-communicable diseases. Why should you not wait past 5 hours to eat?: Eating 3-5 hours after your meals will help control blood glucose (sugar) levels and helps control your hunger. Having too large of a gap between meals will cause you to be very hungry and eat too much; this can make you sick if you have had bariatric surgery and/or can cause larger fluctuations in your blood glucose levels if you have diabetes. Those who struggle with hypoglycemia (low blood glucose) will experience more hypoglycemic events if they do not have a consistent meal schedule.   Goals: Aim to eat non starchy vegetables 2 times a day 7 days a week Aim for 64 ounces of tea (doing great doubling the drinking water) Aim for 30 minutes of exercise  minimum 3 days a week at Y or aquatic center Adding some light weight training at home  Teaching Method Utilized:  Visual Auditory Hands on  Handouts given during visit include:  Detailed MyPlate  Barriers to learning/adherence to lifestyle change: contemplative stage of change moving toward action  Demonstrated degree of understanding via:  Teach Back   Monitoring/Evaluation:  Dietary intake, exercise, and body weight prn.

## 2020-09-20 ENCOUNTER — Telehealth: Payer: Self-pay | Admitting: *Deleted

## 2020-09-20 NOTE — Telephone Encounter (Signed)
See phone note

## 2020-09-20 NOTE — Telephone Encounter (Addendum)
Patient had scheduled an appointment with Dr. Einar Pheasant Wednesday 09/22/20 for a sore throat. Dr. Einar Pheasant requested that the patient be called to determine if she should come into the office with her symptoms.  Called and spoke to patient and was advised that she went to [place] on 08/30/20 and her friend asked her is she had been tested for covid. Patient stated that she had a sore throat so she went to an UC in the area and tested positive for strep and negative for covid. Patient stated that the covid test was a send off test. Patient stated that she was put on Penicillin for 10 days and she took all of it. Patient stated that she came home from [place] on 09/12/20. Patient that her throat is sore and swollen again. Patient complains of beng real tired and her eyes watering a lot today. Patient denies a fever but has no way of checking her temperature. Patient stated that she has been losing her voice and coughs at times from that. Patient denies SOB or difficulty breathing. Patient stated that she did a drive thru rapid covid test at [facility] Friday and it was negative. Patient was advised not sure how accurate a rapid covid test is and with her symptoms not sure that we can bring her into the office. Advised patient that this will be discussed with Dr. Einar Pheasant and let her know about the appointment Wednesday.

## 2020-09-20 NOTE — Telephone Encounter (Signed)
Would recommend virtual appointment and we can plan for drive up testing following visit.

## 2020-09-20 NOTE — Telephone Encounter (Signed)
Please triage pt to see if appropriate for in-office appointment

## 2020-09-20 NOTE — Telephone Encounter (Signed)
Called patient and changed visit to virtual.

## 2020-09-21 ENCOUNTER — Encounter: Payer: Self-pay | Admitting: Family Medicine

## 2020-09-21 ENCOUNTER — Telehealth (INDEPENDENT_AMBULATORY_CARE_PROVIDER_SITE_OTHER): Payer: POS | Admitting: Family Medicine

## 2020-09-21 VITALS — BP 130/86 | HR 65 | Temp 97.9°F | Wt 236.0 lb

## 2020-09-21 DIAGNOSIS — J0301 Acute recurrent streptococcal tonsillitis: Secondary | ICD-10-CM | POA: Diagnosis not present

## 2020-09-21 DIAGNOSIS — L309 Dermatitis, unspecified: Secondary | ICD-10-CM

## 2020-09-21 MED ORDER — AMOXICILLIN-POT CLAVULANATE 875-125 MG PO TABS
1.0000 | ORAL_TABLET | Freq: Two times a day (BID) | ORAL | 0 refills | Status: DC
Start: 2020-09-21 — End: 2021-06-01

## 2020-09-21 NOTE — Assessment & Plan Note (Signed)
Suspect either sinusitis or recurrent strept with symptoms persisting. Will treat with course of Augmentin and repeat testing is sore throat persists.

## 2020-09-21 NOTE — Progress Notes (Signed)
I connected with [place] Ponder on 09/21/20 at  9:20 AM EST by video and verified that I am speaking with the correct person using two identifiers.   I discussed the limitations, risks, security and privacy concerns of performing an evaluation and management service by video and the availability of in person appointments. I also discussed with the patient that there may be a patient responsible charge related to this service. The patient expressed understanding and agreed to proceed.  Patient location: Home Provider Location: [place] Participants: Lesleigh Noe and Argentina Ponder   Subjective:     Brandi Ortiz is a 49 y.o. female presenting for Sore Throat (x 3 days )     HPI   #Sore Throat - 08/31/2020 for strept throat and completed 10 days of antibiotics - throat improved on the treatment - started getting hoarse voice which was bad - did feel like it was getting better but her voice has not returned - did have a cough, but this improving - initially - would have cough and gargle in the middle of the night - now when she wakes up in the morning has mucus in the throat - doing a sinus rinse - clear drainage - no face pain - endorses congestion/runny nose - no sneezing - watery eyes - is taking allergy medication and still had watery eyes - no loss of taste or smell - cough improving - no fever/chills - no n/v/d - took a drive thru covid test which was negative  Finished Abx, flew back - still with symptoms  Wore a mask around family because she still sounded so hoarse Throat is hurting in the neck  #Face Rash - not itchy - has had similar rashes in the past - from wearing her mask - Tried ketoconazole and clobetasol on one side - did one day and that side seems to be better -   Review of Systems   Social History   Tobacco Use  Smoking Status Never Smoker  Smokeless Tobacco Never Used        Objective:   BP Readings from Last 3  Encounters:  09/21/20 130/86  07/05/20 130/80  05/23/20 (!) 151/98   Wt Readings from Last 3 Encounters:  09/21/20 236 lb (107 kg)  09/13/20 236 lb 8 oz (107.3 kg)  08/10/20 236 lb 8 oz (107.3 kg)    BP 130/86   Pulse 65   Temp 97.9 F (36.6 C) (Oral)   Wt 236 lb (107 kg)   LMP 04/05/2018 (Exact Date)   BMI 38.09 kg/m    Physical Exam Constitutional:      Appearance: Normal appearance. She is not ill-appearing.  HENT:     Head: Normocephalic and atraumatic.     Right Ear: External ear normal.     Left Ear: External ear normal.  Eyes:     Conjunctiva/sclera: Conjunctivae normal.  Pulmonary:     Effort: Pulmonary effort is normal. No respiratory distress.  Neurological:     Mental Status: She is alert. Mental status is at baseline.  Psychiatric:        Mood and Affect: Mood normal.        Behavior: Behavior normal.        Thought Content: Thought content normal.        Judgment: Judgment normal.     Reviewed uploaded photos - papular rash on the face. Erythematous throat w/ enlarged tonsils w/o exudate  Assessment & Plan:     Problem List Items Addressed This Visit      Respiratory   Recurrent streptococcal tonsillitis    Suspect either sinusitis or recurrent strept with symptoms persisting. Will treat with course of Augmentin and repeat testing is sore throat persists.         Musculoskeletal and Integument   Eczema - Primary    Discussed this could be acne, however, responded to 1 dose of clobetasol. She will continue and if needing for more than 2-3 days will notify me.           Return in about 1 week (around 09/28/2020).  Lesleigh Noe, MD

## 2020-09-21 NOTE — Patient Instructions (Addendum)
Take the antibiotic  If no improvement after 7 days - return and we may need to retest you for strept throat

## 2020-09-21 NOTE — Telephone Encounter (Signed)
Viewed schedule and patient seen virtually

## 2020-09-21 NOTE — Assessment & Plan Note (Signed)
Discussed this could be acne, however, responded to 1 dose of clobetasol. She will continue and if needing for more than 2-3 days will notify me.

## 2020-09-22 ENCOUNTER — Telehealth: Payer: POS | Admitting: Family Medicine

## 2020-10-11 ENCOUNTER — Other Ambulatory Visit: Payer: Self-pay

## 2020-10-11 ENCOUNTER — Encounter: Payer: POS | Attending: Family Medicine | Admitting: Skilled Nursing Facility1

## 2020-10-11 DIAGNOSIS — E669 Obesity, unspecified: Secondary | ICD-10-CM | POA: Insufficient documentation

## 2020-10-11 NOTE — Progress Notes (Signed)
Assessment:  Primary concerns today: weight managment    Pt states she beleives raw vegetables helps her to have a healthy bowel movement.   Progress: in having breakfast regularly  and trying to get more water. Pt states she eats her vegetables. Pt states she tried to cook vegetables at home like beets. Bag of mixed of salad. Pt states she is okay with her weight and eating pattern. Pt is okay with slow progress because she knows it is coming. Pt states tracking weight everyday help her to understand and likes empirical evidence. Pt states she wants to be able to lift her own body weightt.   Pt states this past month was challenging due to having had strep throat. Pt states she is not feeling disappointed stating she went to the gym 3 times which she is excited about. Pt states she will meet with a  personal trainer tomorrow. Pt states she is learning she is going to need to be active every day in order to sleep better still with the minimal goal being 3 days a week. Pt states she has figured out timing with her meals with going to the gym. Pt states she has been In the pool a couple of times which she enjoys.  Pt states she likes the accountability of these appts.    Body Composition Scale 08/10/2020 09/13/2020 10/11/2020  Current Body Weight 236.5 236.5 237.8  Total Body Fat % 42.8 46.7% 43  Visceral Fat 14 18 14   Fat-Free Mass % 57.1 53.2 56.9   Total Body Water % 43 41.1 42.9  Muscle-Mass lbs 32.9 28.4 32.9  BMI 38.1  38.3  Body Fat Displacement            Torso  lbs 62.8 68.5 63.4         Left Leg  lbs 12.5 13.7 12.6         Right Leg  lbs 12.5 13.7 12.6         Left Arm  lbs 6.2 6.8 6.3         Right Arm   lbs 6.2 6.8 6.3    MEDICATIONS: see list    DIETARY INTAKE:  Usual eating pattern includes 2 meals and 1 snacks per day.  Everyday foods include none stated.  Avoided foods include none stated.    24-hr recall:  B ( AM):  Oatmeal+resins +sausage+ tea Snk ( AM):  L (  11-12PM): taco salad (half for lunch and half for dinner) Snk ( PM): salad + grilled chicken  D ( PM): pizza or salad Snk ( PM):  Beverages: herbal detox tea  Usual physical activity: running/walking 5 days a week 30 minutes (inconsistant)   Estimated energy needs: 1500 calories  Progress Towards Goal(s):  In progress.    Intervention:  Nutrition cousneling. Dietitian educated pt on a healthy diet within the context of weight management. Enforced: Importance of vegetables To have an overall healthy diet, adult men and women are recommended to consume anywhere from 2-3 cups of vegetables daily. Vegetables provide a wide range of vitamins and minerals such as vitamin A, vitamin C, potassium, and folic acid. According to the [facility], including fruit and vegetables daily may reduce the risk of cardiovascular disease, certain cancers, and other non-communicable diseases. Why should you not wait past 5 hours to eat?: Eating 3-5 hours after your meals will help control blood glucose (sugar) levels and helps control your hunger. Having too large of a gap between  meals will cause you to be very hungry and eat too much; this can make you sick if you have had bariatric surgery and/or can cause larger fluctuations in your blood glucose levels if you have diabetes. Those who struggle with hypoglycemia (low blood glucose) will experience more hypoglycemic events if they do not have a consistent meal schedule.   Goals: Continued Aim to eat non starchy vegetables 2 times a day 7 days a week Aim for 64 ounces of tea (doing great doubling the drinking water) Aim for 30 minutes of exercise  minimum 3 days a week at Y or aquatic center Adding some light weight training at home  Teaching Method Utilized:  Visual Auditory Hands on  Handouts previously given during visit include:  Detailed MyPlate  Barriers to learning/adherence to lifestyle change: none identified   Demonstrated  degree of understanding via:  Teach Back   Monitoring/Evaluation:  Dietary intake, exercise, and body weight prn.

## 2021-01-25 NOTE — H&P (Signed)
[facility] OFFICE  [address] [place], [place] [address]     Emily Adams    Date of Visit:  06/23/2015  Date of Birth: 1971/04/28  Age: 50 yrs.   Medical Record Number: [id number]  Referring Physician: Jimmie Molly MD, ERAN  __   CURRENT DIAGNOSES     1. Abnormal electrocardiogram, R94.31  __  ALLERGIES     No Known Allergies  __  MEDICATIONS     1. prednisone 20 mg tablet, 1 po tid  2. ProAir  HFA 90 mcg/actuation aerosol inhaler, take as directed  3. Vitamin D3 1,000 unit capsule, 1 po qd  4. iron 18 mg tablet, 1 po qd  5. Glucosamine 500 mg tablet, 1 po qd  6. vitamin B complex capsule, 1 po qd  7. biotin 1,000 mcg chewable  tablet, 1 po qd  8. multivitamin capsule, 1 po bid  __  CHIEF COMPLAINT/REASON FOR VISIT  Abnormal EKG  __   HISTORY OF PRESENT ILLNESS  Emily Adams presents for clinical evaluation having been noted to have an abnormal ECG at an outside facility. The patient reports that she sought medical attention when she  noticed that she had some dyspnea and "mucus production" while doing her exercise regimen. She was able to participate in a 10K race the day before without any difficulty. The following day, however, she developed this problem while on her usual exercise  regimen consisting of both running and walking. She denied any associated diaphoresis or lightheadedness. Generally, she does not have any problems with exertional chest discomfort. She was placed on prednisone and ProAir in addition to her usual medical  regimen. She does report that she has been suffering from nasal congestion, sinus drainage, and morning cough since she moved to [place] in 2011. She denies snoring or excess daytime somnolence. She is overweight and working on it.   __   PAST HISTORY     Past Medical Illnesses: No previous history of significant medical illnesses.;   Past Cardiac Illnesses: No previous history of cardiac disease.; Infectious Diseases : Chicken Pox; Surgical Procedures: Leep  Procedure; Trauma History : No previous history of significant trauma.; Cardiology Procedures-Invasive: No previous interventional or invasive cardiology procedures.;  Cardiology Procedures-Noninvasive: No previous non-invasive cardiovascular testing.  ___   FAMILY HISTORY  Father -- Diabetes mellitus  Mother  -- Diabetes mellitus    __  CARDIAC RISK FACTORS      Tobacco Abuse: has never used tobacco; Family History of Heart Disease: no family history of cardiovascular  disease; Hyperlipidemia: negative; Hypertension: negative;   Diabetes Mellitus: negative; Prior History of Heart Disease : negative; Obesity: positive; Sedentary Life Style :negative; [id number]; Menopausal:negative  __   SOCIAL HISTORY    Alcohol Use: socially; Smoking : Does not smoke; Never smoker ([id number]); Diet: Regular diet; Lifestyle : Single; Exercise: Exercises occasionally; Occupation : works for [employer];   __  REVIEW OF SYSTEMS    General : decreased exercise tolerance; Integumentary: Denies  any change in hair or nails, rashes, or skin lesions.; Eyes: Denies diplopia, glaucoma or visual field defects.;  Ears, Nose, Throat, Mouth: Denies any hearing loss, epistaxis, hoarseness or difficulty speaking.;Respiratory : Denies dyspnea, cough, wheezing or hemoptysis.; Cardiovascular:  edema; Abdominal : Denies ulcer disease, hematochezia or melena.; Musculoskeletal:Denies any venous insufficiency, arthritic symptoms or back problems.; Neurological  : Denies any recurrent strokes, TIA, or seizure disorder.; Psychiatric:  Denies any depression, substance abuse or change in cognitive functions.; Endocrine: Denies any  weight  change, heat/cold intolerance, polydipsia, or polyuria; Hematologic/Immunologic: Denies any food allergies,  seasonal allergies, bleeding disorders.  __  PHYSICAL EXAMINATION    Vital Signs :  Blood Pressure:  110/70 Sitting, Left arm, large cuff  118/78 Supine, Right arm, large cuff     Weight: 231.00 lbs.  Height:  64"  BMI:  39   Pulse: 74/min. Apical   Respirations: 16/min.  regular and relaxed      Constitutional: Cooperative, alert and oriented,well developed, well  nourished, in no acute distress. Skin: warm and dry to touch Head : normocephalic, normal female hair pattern Eyes: conjunctivae and lids normal, EOMS intact  ENT: Ears, Nose and throat reveal no gross abnormalities. No pallor or cyanosis. Dentition good. Neck : carotid pulses are full and equal bilaterally, no bruits Chest: Normal symmetry, clear to auscultation  bilaterally, normal A-P diameter, normal respiratory excursion Cardiac: Regular rhythm, S1 normal, S2 normal, No S3 or S4, Apical impulse not displaced,  no murmurs, gallops or rubs detected. Abdomen: bowel sounds normoactive, severely obese Peripheral  Pulses: pulses full and equal in all extremities Extremities/Back : No spinal abnormalities noted., Normal muscle strength and tone., no edema present Neurological: No  gross motor or sensory deficits noted, affect appropriate, oriented to time, person and place.   __    Medications added today by the physician:       ECG:  Review of her ECG shows poor R-wave progression but no acute ST abnormality.     IMPRESSIONS :  1. Abnormal ECG due to poor R-wave progression and anterior T-wave changes in V2, likely  related to soft tissue attenuation in an overweight female.  2. Exertional "mucus"/dyspnea likely exercise associated asthma versus allergies.   3. Chronic sinus congestion, morning cough and drainage very suggestive of allergies.  4. No history of hypertension, diabetes, hypercholesterolemia, smoking, or family history of  premature ischemic heart disease.  5. Obesity.     RECOMMENDATIONS:  1. The patient will return for an exercise treadmill test to make sure that she does not have any  exercise intolerance based on her cardiovascular system.  2. I have encouraged  her to follow up with her primary regarding an ENT referral or pulmonologist  to  further evaluate her daily cough, sinus congestion, and throat discomfort. Allergy testing or  desensitization may be of benefit.  3. Dietary modification and  regular physical exercise were encouraged as the patient is already  working on weight loss.    Emily Slates, MD     Tid: [id number]    cc: Yevonne Pax MD     [facility]   ____________________________  TODAYS ORDERS  12 Lead ECG Today  Exercise Treadmill [facility] [employer]

## 2021-02-24 ENCOUNTER — Encounter: Payer: Self-pay | Admitting: Family Medicine

## 2021-02-24 DIAGNOSIS — U071 COVID-19: Secondary | ICD-10-CM

## 2021-02-24 MED ORDER — NIRMATRELVIR/RITONAVIR (PAXLOVID)TABLET: ORAL_TABLET | ORAL | 0 refills | Status: DC

## 2021-05-30 ENCOUNTER — Encounter: Payer: Self-pay | Admitting: Family Medicine

## 2021-06-01 ENCOUNTER — Other Ambulatory Visit: Payer: Self-pay

## 2021-06-01 ENCOUNTER — Ambulatory Visit: Payer: POS | Admitting: Family Medicine

## 2021-06-01 ENCOUNTER — Encounter: Payer: Self-pay | Admitting: Family Medicine

## 2021-06-01 VITALS — BP 138/84 | HR 64 | Temp 97.4°F | Wt 253.5 lb

## 2021-06-01 DIAGNOSIS — M722 Plantar fascial fibromatosis: Secondary | ICD-10-CM | POA: Diagnosis not present

## 2021-06-01 NOTE — Progress Notes (Signed)
   Subjective:     Brandi Ortiz is a 50 y.o. female presenting for Foot Pain (Heels on both feet x 1 month. R is worse today. Pain is worse when barefooted )     Foot Pain This is a new problem. The current episode started more than 1 month ago. The problem occurs constantly. The problem has been gradually worsening. Pertinent negatives include no numbness or weakness. Associated symptoms comments: throbbing. Exacerbated by: barefoot, certain shoes. She has tried NSAIDs for the symptoms. The treatment provided mild relief.   New activity - travels to [place] 1-2 times per month - does wear compression socks when she flies   Not necessarily worse first thing in the morning Is painful when she gets out of bed to go to the bathroom Also painful if standing for long time period  Can wear heels/wedges/crocks, but flat shoes or barefoot is painful  Review of Systems  Neurological:  Negative for weakness and numbness.    Social History   Tobacco Use  Smoking Status Never  Smokeless Tobacco Never        Objective:    BP Readings from Last 3 Encounters:  06/01/21 138/84  09/21/20 130/86  07/05/20 130/80   Wt Readings from Last 3 Encounters:  06/01/21 253 lb 8 oz (115 kg)  10/11/20 237 lb 12.8 oz (107.9 kg)  09/21/20 236 lb (107 kg)    BP 138/84   Pulse 64   Temp (!) 97.4 F (36.3 C) (Temporal)   Wt 253 lb 8 oz (115 kg)   LMP 04/05/2018 (Exact Date)   SpO2 97%   BMI 40.92 kg/m    Physical Exam Constitutional:      General: She is not in acute distress.    Appearance: She is well-developed. She is not diaphoretic.  HENT:     Right Ear: External ear normal.     Left Ear: External ear normal.  Eyes:     Conjunctiva/sclera: Conjunctivae normal.  Cardiovascular:     Rate and Rhythm: Normal rate.  Pulmonary:     Effort: Pulmonary effort is normal.  Musculoskeletal:     Cervical back: Neck supple.     Comments: Foot exam Inspection: no swelling or  erythema Palpation: Right - no ttp along the ankle, foot, or heel. Left some ttp along the medial heel otherwise no ttp ROM: normal Strength: normal, though heel pain with resisted plantar flexion  Skin:    General: Skin is warm and dry.     Capillary Refill: Capillary refill takes less than 2 seconds.  Neurological:     Mental Status: She is alert. Mental status is at baseline.  Psychiatric:        Mood and Affect: Mood normal.        Behavior: Behavior normal.          Assessment & Plan:   Problem List Items Addressed This Visit       Musculoskeletal and Integument   Plantar fasciitis - Primary    Bilateral. Benign exam. NSAIDs, home exercises. If no improving - physical therapy.           Return if symptoms worsen or fail to improve.  Lesleigh Noe, MD  This visit occurred during the SARS-CoV-2 public health emergency.  Safety protocols were in place, including screening questions prior to the visit, additional usage of staff PPE, and extensive cleaning of exam room while observing appropriate contact time as indicated for disinfecting solutions.

## 2021-06-01 NOTE — Patient Instructions (Signed)
Plantar Fasciitis  Plantar fasciitis is a painful foot condition that affects the heel. It occurs when the band of tissue that connects the toes to the heel bone (plantar fascia) becomes irritated. This can happen as the result of exercising too much or doing other repetitive activities (overuse injury). Plantar fasciitis can cause mild irritation to severe pain that makes it difficult to walk or move. The pain is usually worse in the morning after sleeping, or after sitting or lying down for a period of time. Pain may also beworse after long periods of walking or standing. What are the causes? This condition may be caused by: Standing for long periods of time. Wearing shoes that do not have good arch support. Doing activities that put stress on joints (high-impact activities). This includes ballet and exercise that makes your heart beat faster (aerobic exercise), such as running. Being overweight. An abnormal way of walking (gait). Tight muscles in the back of your lower leg (calf). High arches in your feet or flat feet. Starting a new athletic activity. What are the signs or symptoms? The main symptom of this condition is heel pain. Pain may get worse after the following: Taking the first steps after a time of rest, especially in the morning after awakening, or after you have been sitting or lying down for a while. Long periods of standing still. Pain may decrease after 30-45 minutes of activity, such as gentle walking. How is this diagnosed? This condition may be diagnosed based on your medical history, a physical exam, and your symptoms. Your health care provider will check for: A tender area on the bottom of your foot. A high arch in your foot or flat feet. Pain when you move your foot. Difficulty moving your foot. You may have imaging tests to confirm the diagnosis, such as: X-rays. Ultrasound. MRI. How is this treated? Treatment for plantar fasciitis depends on how severe your  condition is. Treatment may include: Rest, ice, pressure (compression), and raising (elevating) the affected foot. This is called RICE therapy. Your health care provider may recommend RICE therapy along with over-the-counter pain medicines to manage your pain. Exercises to stretch your calves and your plantar fascia. A splint that holds your foot in a stretched, upward position while you sleep (night splint). Physical therapy to relieve symptoms and prevent problems in the future. Injections of steroid medicine (cortisone) to relieve pain and inflammation. Stimulating your plantar fascia with electrical impulses (extracorporeal shock wave therapy). This is usually the last treatment option before surgery. Surgery, if other treatments have not worked after 12 months. Follow these instructions at home: Managing pain, stiffness, and swelling  If directed, put ice on the painful area. To do this: Put ice in a plastic bag, or use a frozen bottle of water. Place a towel between your skin and the bag or bottle. Roll the bottom of your foot over the bag or bottle. Do this for 20 minutes, 2-3 times a day. Wear athletic shoes that have air-sole or gel-sole cushions, or try soft shoe inserts that are designed for plantar fasciitis. Elevate your foot above the level of your heart while you are sitting or lying down.  Activity Avoid activities that cause pain. Ask your health care provider what activities are safe for you. Do physical therapy exercises and stretches as told by your health care provider. Try activities and forms of exercise that are easier on your joints (low impact). Examples include swimming, water aerobics, and biking. General instructions Take over-the-counter   and prescription medicines only as told by your health care provider. Wear a night splint while sleeping, if told by your health care provider. Loosen the splint if your toes tingle, become numb, or turn cold and blue. Maintain  a healthy weight, or work with your health care provider to lose weight as needed. Keep all follow-up visits. This is important. Contact a health care provider if you have: Symptoms that do not go away with home treatment. Pain that gets worse. Pain that affects your ability to move or do daily activities. Summary Plantar fasciitis is a painful foot condition that affects the heel. It occurs when the band of tissue that connects the toes to the heel bone (plantar fascia) becomes irritated. Heel pain is the main symptom of this condition. It may get worse after exercising too much or standing still for a long time. Treatment varies, but it usually starts with rest, ice, pressure (compression), and raising (elevating) the affected foot. This is called RICE therapy. Over-the-counter medicines can also be used to manage pain. This information is not intended to replace advice given to you by your health care provider. Make sure you discuss any questions you have with your healthcare provider. Document Revised: 01/26/2020 Document Reviewed: 01/26/2020 Elsevier Patient Education  2022 Elsevier Inc.  

## 2021-06-01 NOTE — Assessment & Plan Note (Signed)
Bilateral. Benign exam. NSAIDs, home exercises. If no improving - physical therapy.

## 2021-06-16 ENCOUNTER — Encounter: Payer: Self-pay | Admitting: Family Medicine

## 2021-06-28 ENCOUNTER — Ambulatory Visit: Payer: POS | Admitting: Family Medicine

## 2021-07-08 ENCOUNTER — Encounter: Payer: Self-pay | Admitting: Family Medicine

## 2021-07-08 NOTE — Telephone Encounter (Signed)
I spoke with pt; pt tested + covid on 07/08/21 with home test after returning from [place]. On 07/06/21 started symptoms of prod cough with ? Color of phlegm. Head congestion, scratchy S/T, runny nose and ears are stopped up and both ears are hurting. No fever and no other covid symptoms. Self quarantine, drink plenty of fluids, rest, and take Tylenol for fever. UC & ED precautions given and pt voiced understanding. Pt said she will probably go to [place] on [address]. Will send note to DR Einar Pheasant who is out of office, Gentry Fitz NP who is in office and [place] CMA.

## 2021-07-08 NOTE — Telephone Encounter (Signed)
Noted and agree. 

## 2021-07-11 ENCOUNTER — Encounter: Payer: POS | Admitting: Family Medicine

## 2021-07-15 ENCOUNTER — Encounter: Payer: Self-pay | Admitting: Family Medicine

## 2021-07-19 ENCOUNTER — Other Ambulatory Visit: Payer: POS

## 2021-07-19 ENCOUNTER — Other Ambulatory Visit: Payer: Self-pay

## 2021-07-19 ENCOUNTER — Ambulatory Visit (INDEPENDENT_AMBULATORY_CARE_PROVIDER_SITE_OTHER): Payer: POS | Admitting: Family Medicine

## 2021-07-19 VITALS — BP 126/80 | HR 79 | Temp 96.0°F | Ht 65.2 in | Wt 249.4 lb

## 2021-07-19 DIAGNOSIS — K760 Fatty (change of) liver, not elsewhere classified: Secondary | ICD-10-CM

## 2021-07-19 DIAGNOSIS — Z0000 Encounter for general adult medical examination without abnormal findings: Secondary | ICD-10-CM

## 2021-07-19 DIAGNOSIS — E559 Vitamin D deficiency, unspecified: Secondary | ICD-10-CM | POA: Diagnosis not present

## 2021-07-19 DIAGNOSIS — A599 Trichomoniasis, unspecified: Secondary | ICD-10-CM

## 2021-07-19 DIAGNOSIS — Z23 Encounter for immunization: Secondary | ICD-10-CM | POA: Diagnosis not present

## 2021-07-19 DIAGNOSIS — Z6838 Body mass index (BMI) 38.0-38.9, adult: Secondary | ICD-10-CM

## 2021-07-19 LAB — CBC
HCT: 41.9 % (ref 36.0–46.0)
Hemoglobin: 13.5 g/dL (ref 12.0–15.0)
MCHC: 32.1 g/dL (ref 30.0–36.0)
MCV: 85 fl (ref 78.0–100.0)
Platelets: 200 10*3/uL (ref 150.0–400.0)
RBC: 4.93 Mil/uL (ref 3.87–5.11)
RDW: 14.4 % (ref 11.5–15.5)
WBC: 5.8 10*3/uL (ref 4.0–10.5)

## 2021-07-19 LAB — COMPREHENSIVE METABOLIC PANEL
ALT: 55 U/L — ABNORMAL HIGH (ref 0–35)
AST: 37 U/L (ref 0–37)
Albumin: 4.2 g/dL (ref 3.5–5.2)
Alkaline Phosphatase: 136 U/L — ABNORMAL HIGH (ref 39–117)
BUN: 10 mg/dL (ref 6–23)
CO2: 29 mEq/L (ref 19–32)
Calcium: 9.2 mg/dL (ref 8.4–10.5)
Chloride: 102 mEq/L (ref 96–112)
Creatinine, Ser: 0.9 mg/dL (ref 0.40–1.20)
GFR: 75.5 mL/min (ref >60.00)
Glucose, Bld: 90 mg/dL (ref 70–99)
Potassium: 4.1 mEq/L (ref 3.5–5.1)
Sodium: 138 mEq/L (ref 135–145)
Total Bilirubin: 0.7 mg/dL (ref 0.2–1.2)
Total Protein: 7.2 g/dL (ref 6.0–8.3)

## 2021-07-19 LAB — LIPID PANEL
Cholesterol: 175 mg/dL (ref 0–200)
HDL: 52.6 mg/dL (ref >39.00)
LDL Cholesterol: 96 mg/dL (ref 0–99)
NonHDL: 122.26
Total CHOL/HDL Ratio: 3
Triglycerides: 131 mg/dL (ref 0.0–149.0)
VLDL: 26.2 mg/dL (ref 0.0–40.0)

## 2021-07-19 LAB — VITAMIN D 25 HYDROXY (VIT D DEFICIENCY, FRACTURES): VITD: 29.84 ng/mL — ABNORMAL LOW (ref 30.00–100.00)

## 2021-07-19 NOTE — Progress Notes (Signed)
Annual Exam   Chief Complaint:  Chief Complaint  Patient presents with   Annual Exam    See recent mychart message    History of Present Illness:  Ms. Brandi Ortiz is a 50 y.o. G1P0010 who LMP was Patient's last menstrual period was 04/05/2018 (exact date)., presents today for her annual examination.    Recent UTI and vaginitis which resolved   Nutrition Diet: trying to do better, intermittent fasting Exercise: working with a trainer - 2-3 times a week and doing 15 minute yoga She does get adequate calcium and Vitamin D in her diet.   Social History   Tobacco Use  Smoking Status Never  Smokeless Tobacco Never   Social History   Substance and Sexual Activity  Alcohol Use Yes   Comment: a few times a month   Social History   Substance and Sexual Activity  Drug Use No    Safety The patient wears seatbelts: yes.     The patient feels safe at home and in their relationships: yes.  General Health Dentist in the last year: No Eye doctor: no  Menstrual No menopause symptoms No longer having periods  GYN She is single partner, contraception - post menopausal status.    Cervical Cancer Screening:   Last Pap:   June 2019 Results were: no abnormalities /neg HPV DNA    Breast Cancer Screening There is no FH of breast cancer. There is no FH of ovarian cancer. BRCA screening Not Indicated.  Discussed that for average risk women between age 16-49 screening may reduce the risk of breast cancer death, however, at a lower rate than those over age 62. And that the the false-positive rates resulting in unnecessary biopsies with more screening is higher. The balance of benefits vs harms likely improves as you progress through your 40s. The patient does want a mammogram this year.   Colon Cancer Screening:  Age 48-75 yo - benefits outweigh the risk. Adults 71-85 yo who have never been screened benefit.  Benefits: 134000 people in 2016 will be diagnosed and 49,000 will die  - early detection helps Harms: Complications 2/2 to colonoscopy High Risk (Colonoscopy): genetic disorder (Lynch syndrome or familial adenomatous polyposis), personal hx of IBD, previous adenomatous polyp, or previous colorectal cancer, FamHx start 10 years before the age at diagnosis, increased in males and black race  Options:  FIT - looks for hemoglobin (blood in the stool) - specific and fairly sensitive - must be done annually Cologuard - looks for DNA and blood - more sensitive - therefore can have more false positives, every 3 years Colonoscopy - every 10 years if normal - sedation, bowl prep, must have someone drive you  Shared decision making and the patient had decided to do colonoscopy - 2019.  Weight Wt Readings from Last 3 Encounters:  07/19/21 249 lb 6 oz (113.1 kg)  06/01/21 253 lb 8 oz (115 kg)  10/11/20 237 lb 12.8 oz (107.9 kg)   Patient has high BMI  BMI Readings from Last 1 Encounters:  07/19/21 41.24 kg/m     Chronic disease screening Blood pressure monitoring:  BP Readings from Last 3 Encounters:  07/19/21 126/80  06/01/21 138/84  09/21/20 130/86    Lipid Monitoring: Indication for screening: age >56, obesity, diabetes, family hx, CV risk factors.  Lipid screening: Yes  Lab Results  Component Value Date   CHOL 175 07/05/2020   HDL 53.50 07/05/2020   LDLCALC 84 07/05/2020   TRIG 188.0 (H) 07/05/2020  CHOLHDL 3 07/05/2020     Diabetes Screening: age >50, overweight, family hx, PCOS, hx of gestational diabetes, at risk ethnicity Diabetes Screening screening: Yes  Lab Results  Component Value Date   HGBA1C 4.5 (L) 04/15/2018     Past Medical History:  Diagnosis Date   Allergy    Chicken pox    Heart murmur     Past Surgical History:  Procedure Laterality Date   CERVICAL BIOPSY  W/ LOOP ELECTRODE EXCISION  12/22/2011    Prior to Admission medications   Medication Sig Start Date End Date Taking? Authorizing Provider  Ascorbic Acid  (VITAMIN C PO) Take by mouth.   Yes [provider]  clobetasol (TEMOVATE) 0.05 % GEL Apply 1 application topically daily as needed. 07/05/20  Yes Lesleigh Noe, MD  Elderberry 575 MG/5ML SYRP Take by mouth.   Yes [provider]  famotidine (PEPCID) 20 MG tablet Take by mouth. 04/20/19  Yes [provider]  fexofenadine (ALLEGRA) 180 MG tablet Take 180 mg by mouth daily.   Yes [provider]  fluticasone (CUTIVATE) 0.05 % cream Apply topically daily as needed. 07/05/20  Yes Lesleigh Noe, MD  fluticasone (FLONASE) 50 MCG/ACT nasal spray Place into both nostrils daily.   Yes [provider]  Glucosamine Sulfate (SYNOVACIN PO) Glucosamine  1 tablet daily   Yes [provider]  Iron-Vitamin C (VITRON-C PO) Take by mouth daily.   Yes [provider]  ketoconazole (NIZORAL) 2 % cream Apply topically daily as needed for irritation. 07/05/20  Yes Lesleigh Noe, MD  Omega-3 Fatty Acids (FISH OIL) 500 MG CAPS Take by mouth as needed.   Yes [provider]  Turmeric 500 MG CAPS Take by mouth daily.    Yes [provider]  vitamin B-12 (CYANOCOBALAMIN) 100 MCG tablet Take 300 mcg by mouth daily.   Yes [provider]    No Known Allergies  Gynecologic History: Patient's last menstrual period was 04/05/2018 (exact date).  Obstetric History: G1P0010  Social History   Socioeconomic History   Marital status: Single    Spouse name: Not on file   Number of children: Not on file   Years of education: PhD   Highest education level: Not on file  Occupational History   Not on file  Tobacco Use   Smoking status: Never   Smokeless tobacco: Never  Vaping Use   Vaping Use: Never used  Substance and Sexual Activity   Alcohol use: Yes    Comment: a few times a month   Drug use: No   Sexual activity: Yes    Birth control/protection: Condom, Post-menopausal  Other Topics Concern   Not on file  Social History  Narrative   07/05/20   From[place], moved to [place] to be near mom   Living: living alone   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[employer], 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      Family: mom, aunts/uncles also in [place]      Enjoys: running, cooking, and eating, social gatherings       Exercise: running   Diet: intermittent fasting      Safety   Seat belts: Yes    Guns: No   Safe in relationships: Yes    '   Social Determinants of Radio broadcast assistant Strain: Not on file  Food Insecurity: Not on file  Transportation Needs: Not on file  Physical Activity:  Not on file  Stress: Not on file  Social Connections: Not on file  Intimate Partner Violence: Not on file    Family History  Problem Relation Age of Onset   Alcohol abuse Father    Hypertension Brother    Diabetes Brother    Diabetes Maternal Aunt    Diabetes Maternal Uncle    Lung cancer Maternal Uncle    Diabetes Maternal Grandfather    Diabetes Mother    Heart attack Mother 33    Review of Systems  Constitutional:  Negative for chills and fever.  HENT:  Negative for congestion and sore throat.   Eyes:  Negative for blurred vision and double vision.  Respiratory:  Negative for shortness of breath.   Cardiovascular:  Negative for chest pain.  Gastrointestinal:  Negative for heartburn, nausea and vomiting.  Genitourinary: Negative.   Musculoskeletal: Negative.  Negative for myalgias.  Skin:  Negative for rash.  Neurological:  Negative for dizziness and headaches.  Endo/Heme/Allergies:  Does not bruise/bleed easily.  Psychiatric/Behavioral:  Negative for depression. The patient is not nervous/anxious.     Physical Exam BP 126/80   Pulse 79   Temp (!) 96 F (35.6 C) (Temporal)   Ht 5' 5.2" (1.656 m)   Wt 249 lb 6 oz (113.1 kg)   LMP 04/05/2018 (Exact Date)   SpO2 97%   BMI 41.24 kg/m    BP Readings from Last 3 Encounters:  07/19/21 126/80  06/01/21 138/84  09/21/20  130/86      Physical Exam Constitutional:      General: She is not in acute distress.    Appearance: She is well-developed. She is not diaphoretic.  HENT:     Head: Normocephalic and atraumatic.     Right Ear: External ear normal.     Left Ear: External ear normal.     Nose: Nose normal.  Eyes:     General: No scleral icterus.    Extraocular Movements: Extraocular movements intact.     Conjunctiva/sclera: Conjunctivae normal.  Cardiovascular:     Rate and Rhythm: Normal rate and regular rhythm.     Heart sounds: No murmur heard. Pulmonary:     Effort: Pulmonary effort is normal. No respiratory distress.     Breath sounds: Normal breath sounds. No wheezing.  Abdominal:     General: Bowel sounds are normal. There is no distension.     Palpations: Abdomen is soft. There is no mass.     Tenderness: There is no abdominal tenderness. There is no guarding or rebound.  Musculoskeletal:        General: Normal range of motion.     Cervical back: Neck supple.  Lymphadenopathy:     Cervical: No cervical adenopathy.  Skin:    General: Skin is warm and dry.     Capillary Refill: Capillary refill takes less than 2 seconds.  Neurological:     Mental Status: She is alert and oriented to person, place, and time. Mental status is at baseline.     Deep Tendon Reflexes: Reflexes normal.  Psychiatric:        Mood and Affect: Mood normal.        Behavior: Behavior normal.     Results:  PHQ-9:  Depression screen [facility] 2/9 08/10/2020 07/05/2020 03/22/2017  Decreased Interest 0 0 0  Down, Depressed, Hopeless 0 0 0  PHQ - 2 Score 0 0 0       Assessment: 50 y.o. G35P0010 female here for routine  annual physical examination.  Plan: Problem List Items Addressed This Visit       Digestive   NAFLD (nonalcoholic fatty liver disease)   Relevant Orders   Comprehensive metabolic panel     Other   Vitamin D deficiency   Relevant Orders   Vitamin D, 25-hydroxy   Class 2 severe obesity due  to excess calories with serious comorbidity and body mass index (BMI) of 38.0 to 38.9 in adult [facility])   Relevant Orders   CBC   Lipid panel   Hemoglobin A1c   Other Visit Diagnoses     Annual physical exam    -  Primary   Need for influenza vaccination       Relevant Orders   Flu Vaccine QUAD 31moIM (Fluarix, Fluzone & Alfiuria Quad PF)   Trichomonas infection       Relevant Orders   Trichomonas vaginalis, RNA      Reviewed guidelines of recommendation for test of cure for trichomonas  Screening: -- Blood pressure screen normal -- cholesterol screening: will obtain -- Weight screening: obese: discussed management options, including lifestyle, dietary, and exercise. -- Diabetes Screening: will obtain -- Nutrition: Encouraged healthy diet  The 10-year ASCVD risk score (Arnett DK, et al., 2019) is: 1.3%   Values used to calculate the score:     Age: [age]years     Sex: Female     Is Non-Hispanic African American: Yes     Diabetic: No     Tobacco smoker: No     Systolic Blood Pressure: 1376mmHg     Is BP treated: No     HDL Cholesterol: 53.5 mg/dL     Total Cholesterol: 175 mg/dL  -- Statin therapy for Age 48-75with CVD risk >7.5%  Psych -- Depression screening (PHQ-9): low risk   Safety -- tobacco screening: not using -- alcohol screening:  low-risk usage. -- no evidence of domestic violence or intimate partner violence.   Cancer Screening -- pap smear not collected per ASCCP guidelines -- family history of breast cancer screening: done. not at high risk. -- Mammogram -  information provided -- Colon cancer (age 48+--  requesting records  Immunizations Immunization History  Administered Date(s) Administered   Influenza Inj Mdck Quad With Preservative 08/27/2018   Influenza,inj,Quad PF,6+ Mos 07/05/2020   Influenza,inj,quad, With Preservative 07/30/2019   Influenza-Unspecified 08/06/2018   PFIZER(Purple Top)SARS-COV-2 Vaccination 01/08/2020, 02/03/2020,  10/29/2020   Typhoid Live 08/27/2018    -- flu vaccine not up to date - given today -- TDAP q10 years up to date - due 2023 -- Covid-19 Vaccine up to date   Encouraged healthy diet and exercise. Encouraged regular vision and dental care.   JLesleigh Noe MD

## 2021-07-19 NOTE — Patient Instructions (Signed)
Please call the location of your choice from the menu below to schedule your Mammogram and/or Bone Density appointment.    [place]   [facility]                      Phone:  [phone] [address]                               [place], [place] [address]                                                             Services: Traditional and 3D Mammogram, Bone Density   [facility]                 Phone: [phone] [address]                                                       [place], [place] [address]    Service: Bone Density ONLY   *this site does NOT perform mammograms  [facility]                        Phone:  [phone] [address]                                  [place], [place] [address]                                            Services:  3D Mammogram and Bone Density    [place]  [facility]   Phone:  [phone]   [address]                                                                            [place], [place] [address]                                            Services: 3D Mammogram and Bone Density  [facility] [place] ([facility])  Phone:  [phone]   [address]                        [place], [place] [address]                                                Services:  3D Mammogram and Bone Density

## 2021-07-20 LAB — HEMOGLOBIN A1C
Hgb A1c MFr Bld: 4.8 % of total Hgb (ref <5.7)
Mean Plasma Glucose: 91 mg/dL
eAG (mmol/L): 5 mmol/L

## 2021-07-21 ENCOUNTER — Encounter: Payer: Self-pay | Admitting: Family Medicine

## 2021-07-23 LAB — TRICHOMONAS VAGINALIS, PROBE AMP: Trichomonas vaginalis RNA: NOT DETECTED

## 2021-07-25 ENCOUNTER — Encounter: Payer: Self-pay | Admitting: Family Medicine

## 2021-09-05 ENCOUNTER — Encounter: Payer: Self-pay | Admitting: Family Medicine

## 2021-12-09 ENCOUNTER — Encounter: Payer: Self-pay | Admitting: Family Medicine

## 2021-12-09 DIAGNOSIS — R0683 Snoring: Secondary | ICD-10-CM

## 2022-01-13 ENCOUNTER — Other Ambulatory Visit: Payer: Self-pay | Admitting: Family Medicine

## 2022-01-13 DIAGNOSIS — Z1231 Encounter for screening mammogram for malignant neoplasm of breast: Secondary | ICD-10-CM

## 2022-01-19 ENCOUNTER — Ambulatory Visit: Payer: POS | Admitting: Family Medicine

## 2022-01-19 ENCOUNTER — Encounter: Payer: Self-pay | Admitting: Family Medicine

## 2022-01-19 ENCOUNTER — Other Ambulatory Visit: Payer: Self-pay | Admitting: Family Medicine

## 2022-01-19 ENCOUNTER — Ambulatory Visit (INDEPENDENT_AMBULATORY_CARE_PROVIDER_SITE_OTHER)
Admission: RE | Admit: 2022-01-19 | Discharge: 2022-01-19 | Disposition: A | Discharge status: Home / Self Care | Payer: POS | Source: Ambulatory Visit | Attending: Family Medicine | Admitting: Family Medicine

## 2022-01-19 VITALS — BP 122/84 | HR 79 | Ht 66.0 in | Wt 262.0 lb

## 2022-01-19 DIAGNOSIS — M25462 Effusion, left knee: Secondary | ICD-10-CM | POA: Diagnosis not present

## 2022-01-19 MED ORDER — MELOXICAM 7.5 MG PO TABS: 7.5000 mg | ORAL_TABLET | Freq: Every day | ORAL | 0 refills | Status: DC

## 2022-01-19 NOTE — Progress Notes (Signed)
? ?Subjective:  ? ?  ?Brandi Ortiz is a 51 y.o. female presenting for Knee Pain (Left knee pain , no injury , using topical cream  for pain , soaking in bath, taking otc meds ,some relief with cream, no popping or cracking, pain when baring weight, pain is sometime sharp . Not wear a brace, no x-rays ) ?  ? ? ?Knee Pain  ?There was no injury mechanism. The pain is present in the left knee. Quality: stiffness, sharp. The pain is severe (waking from sleep). The pain has been Worsening since onset. Associated symptoms include a loss of motion. Pertinent negatives include no inability to bear weight, loss of sensation, numbness or tingling. She reports no foreign bodies present. The symptoms are aggravated by movement (sitting). She has tried NSAIDs, heat and elevation (aspercream, massage) for the symptoms. The treatment provided mild relief.  ? ?Massage - at the buttock with improvement in symptoms ?Then had thigh muscles worked on it is getting worse ? ?Starting to feel pain in the right knee now ? ? ?Review of Systems  ?Neurological:  Negative for tingling and numbness.  ? ? ?Social History  ? ?Tobacco Use  ?Smoking Status Never  ?Smokeless Tobacco Never  ? ? ? ?   ?Objective:  ?  ?BP Readings from Last 3 Encounters:  ?01/19/22 122/84  ?07/19/21 126/80  ?06/01/21 138/84  ? ?Wt Readings from Last 3 Encounters:  ?01/19/22 226 lb (102.5 kg)  ?07/19/21 249 lb 6 oz (113.1 kg)  ?06/01/21 253 lb 8 oz (115 kg)  ? ? ?BP 122/84   Pulse 79   Ht '5\' 6"'$  (1.676 m)   Wt 226 lb (102.5 kg)   LMP 04/05/2018 (Exact Date)   SpO2 97%   BMI 36.48 kg/m?  ? ? ?Physical Exam ?Constitutional:   ?   General: She is not in acute distress. ?   Appearance: She is well-developed. She is not diaphoretic.  ?HENT:  ?   Right Ear: External ear normal.  ?   Left Ear: External ear normal.  ?   Nose: Nose normal.  ?Eyes:  ?   Conjunctiva/sclera: Conjunctivae normal.  ?Cardiovascular:  ?   Rate and Rhythm: Normal rate.  ?Pulmonary:  ?   Effort:  Pulmonary effort is normal.  ?Musculoskeletal:  ?   Cervical back: Neck supple.  ?   Comments: Left knee ?Inspection: no erythema, effusion present ?Palpation: ttp along the medial joint line and medial knee ?ROM: Extension with loss of 5%, flexion to ~110 degrees ?Strength: normal ?Ligaments: no laxity or pain with testing  ?Skin: ?   General: Skin is warm and dry.  ?   Capillary Refill: Capillary refill takes less than 2 seconds.  ?Neurological:  ?   Mental Status: She is alert. Mental status is at baseline.  ?Psychiatric:     ?   Mood and Affect: Mood normal.     ?   Behavior: Behavior normal.  ? ? ? ?XR Left knee: interpreted by me - minimal Medial joint line narrowing. No fractures or acute findings ? ?   ?Assessment & Plan:  ? ?Problem List Items Addressed This Visit   ?None ?Visit Diagnoses   ? ? Effusion of left knee    -  Primary  ? Relevant Medications  ? meloxicam (MOBIC) 7.5 MG tablet  ? ?  ? ?XR with possible sings of arthritis. Will f/u final read. Discussed compression, meloxicam x 2 weeks. If no improvement in ROM and  effusion advised f/u with Dr. Lorelei Pont for evaluation.  ? ?If effusion, ROM improves but pain persisting - PT referral ? ?Return in about 2 weeks (around 02/02/2022) for with Dr. Lorelei Pont if not improved . ? ?Lesleigh Noe, MD ? ?This visit occurred during the SARS-CoV-2 public health emergency.  Safety protocols were in place, including screening questions prior to the visit, additional usage of staff PPE, and extensive cleaning of exam room while observing appropriate contact time as indicated for disinfecting solutions.  ? ?

## 2022-01-19 NOTE — Patient Instructions (Addendum)
Knee pain ?- Meloxicam daily x 2 weeks ?- X-ray today ?- get a basic knee brace or knee compression brace ? ?If no improvement in range of motion and pain see Dr. Lorelei Pont for knee pain follow-up ? ?Can also do physical therapy if swelling is improving but still with pain  ?

## 2022-01-26 ENCOUNTER — Encounter: Payer: Self-pay | Admitting: Family Medicine

## 2022-01-26 DIAGNOSIS — M25562 Pain in left knee: Secondary | ICD-10-CM

## 2022-02-01 ENCOUNTER — Encounter: Payer: Self-pay | Admitting: Primary Care

## 2022-02-01 ENCOUNTER — Ambulatory Visit (INDEPENDENT_AMBULATORY_CARE_PROVIDER_SITE_OTHER): Payer: POS | Admitting: Primary Care

## 2022-02-01 VITALS — BP 128/80 | HR 66 | Temp 97.6°F | Ht 66.0 in | Wt 261.8 lb

## 2022-02-01 DIAGNOSIS — Z6838 Body mass index (BMI) 38.0-38.9, adult: Secondary | ICD-10-CM

## 2022-02-01 DIAGNOSIS — R0683 Snoring: Secondary | ICD-10-CM

## 2022-02-01 DIAGNOSIS — L4059 Other psoriatic arthropathy: Secondary | ICD-10-CM | POA: Insufficient documentation

## 2022-02-01 DIAGNOSIS — L409 Psoriasis, unspecified: Secondary | ICD-10-CM | POA: Insufficient documentation

## 2022-02-01 NOTE — Progress Notes (Signed)
[name], female    DOB: [birth year], 51 y.o.   MRN: [id number] ? ?Chief Complaint  ?Patient presents with  ? Consult  ? ? ?Referring provider: ?Lesleigh Noe, MD ? ?HPI: ?51 year old female, never smoked.  Past medical history significant for GERD, cardiac murmur, obesity, vitamin D deficiency, elevated LFTs, psoriatic arthritis, known alcoholic fatty liver disease. ? ?02/01/2022 ?Patient presents today for sleep consult. She has symptoms of snoring and morning fatigue for the last 4 months. She wakes up tired as if she hasn't rested. Experiencing dry mouth, sleeps with her mouth open. She sleeps on her side with two pillows. Wears mouth guard for teeth grinding. Typical bedtime is between 10-11pm. It does not take her long to fall asleep. She wakes on average 2-3 times a night. She starts her day at either 6am if going to the gym or 10am. She has never had sleep study before. She is working on weight loss and interested in starting exercising regimen. Denies symptoms of narcolepsy, cataplexy or sleep walking.  ? ?Sleep questionnaire ?Symptoms- Daytime sleepiness/morning fatigue, snoring  ?Prior sleep study- No  ?Bedtime- 10-11pm ?Time to fall asleep- within minutes  ?Nocturnal awakenings- 2-3 ?Out of bed/start of day- varies, 6am or 10am  ?Weight changes- 30-40lbs  ?Do you operate heavy machinery- No ?Do you currently wear CPAP-No ?Do you current wear oxygen- No ?Epworth- 16 ? ?No Known Allergies ? ?Immunization History  ?Administered Date(s) Administered  ? Influenza Inj Mdck Quad With Preservative 08/27/2018  ? Influenza,inj,Quad PF,6+ Mos 07/05/2020, 07/19/2021  ? Influenza,inj,quad, With Preservative 07/30/2019  ? Influenza-Unspecified 08/06/2018  ? PFIZER(Purple Top)SARS-COV-2 Vaccination 01/08/2020, 02/03/2020, 10/29/2020  ? Typhoid Live 08/27/2018  ? ? ?Past Medical History:  ?Diagnosis Date  ? Allergy   ? Chicken pox   ? Heart murmur   ? ? ?Tobacco History: ?Social History   ? ?Tobacco Use  ?Smoking Status Never  ?Smokeless Tobacco Never  ? ?Counseling given: Not Answered ? ? ?Outpatient Medications Prior to Visit  ?Medication Sig Dispense Refill  ? Ascorbic Acid (VITAMIN C PO) Take by mouth.    ? clobetasol (TEMOVATE) 0.05 % GEL Apply 1 application topically daily as needed. 30 g 1  ? Elderberry 575 MG/5ML SYRP Take by mouth.    ? famotidine (PEPCID) 20 MG tablet Take by mouth.    ? fexofenadine (ALLEGRA) 180 MG tablet Take 180 mg by mouth daily.    ? fluticasone (CUTIVATE) 0.05 % cream Apply topically daily as needed. 60 g 1  ? fluticasone (FLONASE) 50 MCG/ACT nasal spray Place into both nostrils daily.    ? Glucosamine Sulfate (SYNOVACIN PO) Glucosamine ? 1 tablet daily    ? Iron-Vitamin C (VITRON-C PO) Take by mouth daily.    ? ketoconazole (NIZORAL) 2 % cream Apply topically daily as needed for irritation. 60 g 1  ? meloxicam (MOBIC) 7.5 MG tablet Take 1 tablet (7.5 mg total) by mouth daily. 30 tablet 0  ? Omega-3 Fatty Acids (FISH OIL) 500 MG CAPS Take by mouth as needed.    ? Turmeric 500 MG CAPS Take by mouth daily.     ? vitamin B-12 (CYANOCOBALAMIN) 100 MCG tablet Take 300 mcg by mouth daily.    ? ?No facility-administered medications prior to visit.  ? ?Review of Systems ? ?Review of Systems  ?Constitutional:  Positive for fatigue.  ?HENT: Negative.    ?Respiratory: Negative.    ?Psychiatric/Behavioral:  Positive for sleep disturbance.   ? ? ?Physical Exam ? ?  BP 128/80 (BP Location: Left Arm, Patient Position: Sitting, Cuff Size: Large)   Pulse 66   Temp 97.6 ?F (36.4 ?C) (Oral)   Ht '5\' 6"'$  (1.676 m)   Wt 261 lb 12.8 oz (118.8 kg)   LMP 04/05/2018 (Exact Date)   SpO2 97%   BMI 42.26 kg/m?  ?Physical Exam ?Constitutional:   ?   General: She is not in acute distress. ?   Appearance: Normal appearance. She is obese. She is not ill-appearing.  ?HENT:  ?   Head: Normocephalic.  ?   Mouth/Throat:  ?   Mouth: Mucous membranes are moist.  ?   Pharynx: Oropharynx is clear.   ?Cardiovascular:  ?   Rate and Rhythm: Normal rate and regular rhythm.  ?Pulmonary:  ?   Effort: Pulmonary effort is normal.  ?Musculoskeletal:     ?   General: Normal range of motion.  ?Skin: ?   General: Skin is warm and dry.  ?Neurological:  ?   General: No focal deficit present.  ?   Mental Status: She is alert and oriented to person, place, and time. Mental status is at baseline.  ?Psychiatric:     ?   Mood and Affect: Mood normal.     ?   Behavior: Behavior normal.     ?   Thought Content: Thought content normal.  ?  ? ?Lab Results: ? ?CBC ?   ?Component Value Date/Time  ? WBC 5.8 07/19/2021 1156  ? RBC 4.93 07/19/2021 1156  ? HGB 13.5 07/19/2021 1156  ? HGB 13.7 04/15/2018 1537  ? HCT 41.9 07/19/2021 1156  ? HCT 42.1 04/15/2018 1537  ? PLT 200.0 07/19/2021 1156  ? PLT 236 04/15/2018 1537  ? MCV 85.0 07/19/2021 1156  ? MCV 86 04/15/2018 1537  ? MCH 27.8 04/15/2018 1537  ? MCHC 32.1 07/19/2021 1156  ? RDW 14.4 07/19/2021 1156  ? RDW 15.4 04/15/2018 1537  ? ? ?BMET ?   ?Component Value Date/Time  ? NA 138 07/19/2021 1156  ? K 4.1 07/19/2021 1156  ? CL 102 07/19/2021 1156  ? CO2 29 07/19/2021 1156  ? GLUCOSE 90 07/19/2021 1156  ? BUN 10 07/19/2021 1156  ? CREATININE 0.90 07/19/2021 1156  ? CALCIUM 9.2 07/19/2021 1156  ? ? ?BNP ?No results found for: BNP ? ?ProBNP ?No results found for: PROBNP ? ?Imaging: ?DG Knee 4 Views W/Patella Left ? ?Result Date: 01/20/2022 ?CLINICAL DATA:  Left medial knee pain and effusion. EXAM: LEFT KNEE - COMPLETE 4+ VIEW COMPARISON:  None. FINDINGS: No fracture or malalignment. Small knee effusion. The joint spaces are relatively patent. Minimal spurring at the lateral patellofemoral joint space. IMPRESSION: Small knee effusion Electronically Signed   By: Donavan Foil M.D.   On: 01/20/2022 19:43   ? ? ?Assessment & Plan:  ? ?Loud snoring ?- Patient has symptoms of loud snoring, non-restorative sleep and dry mouth.  Epworth 16. BMI 42.  Concern patient could have obstructive sleep  apnea, needs home sleep study to evaluate. Discussed risk of untreated sleep apnea including cardiac arrhythmias, pulm HTN, stroke, DM. We briefly reviewed treatment options. Encouraged patient to work on weight loss efforts and focus on side sleeping position/elevate head of bed. Advised against driving if experiencing excessive daytime sleepiness. Follow-up in 4-6 weeks to review sleep study results and discuss treatment options further. ? ?Class 2 severe obesity due to excess calories with serious comorbidity and body mass index (BMI) of 38.0 to 38.9 in adult [facility]) ?-  Referring to healthy weight and wellness  ? ? ?Martyn Ehrich, NP ?02/01/2022 ? ?

## 2022-02-01 NOTE — Assessment & Plan Note (Signed)
-   Referring to healthy weight and wellness  ?

## 2022-02-01 NOTE — Patient Instructions (Addendum)
Sleep apnea is defined as period of 10 seconds or longer when you stop breathing at night. This can happen multiple times a night. Dx sleep apnea is when this occurs more than 5 times an hour.  ?  ?Mild OSA 5-15 apneic events an hour ?Moderate OSA 15-30 apneic events an hour ?Severe OSA > 30 apneic events an hour ?  ?Untreated sleep apnea puts you at higher risk for cardiac arrhythmias, pulmonary HTN, stroke and diabetes ?  ?Treatment options include weight loss, side sleeping position, oral appliance, CPAP therapy or referral to ENT for possible surgical options  ?  ?Recommendations: ?Focus on side sleeping position ?Work on weight loss efforts  ?Do not drive if experiencing excessive daytime sleepiness of fatigue  ?  ?Orders: ?Home sleep study re: snoring (ordered) ? ?Referral: ?Medical weight management (aka healthy weight and wellness)  ?  ?Follow-up: ?Call after sleep study to scheduled virtual visit to review sleep study results and discuss treatment options further ?

## 2022-02-01 NOTE — Assessment & Plan Note (Signed)
-   Patient has symptoms of loud snoring, non-restorative sleep and dry mouth.  Epworth 16. BMI 42.  Concern patient could have obstructive sleep apnea, needs home sleep study to evaluate. Discussed risk of untreated sleep apnea including cardiac arrhythmias, pulm HTN, stroke, DM. We briefly reviewed treatment options. Encouraged patient to work on weight loss efforts and focus on side sleeping position/elevate head of bed. Advised against driving if experiencing excessive daytime sleepiness. Follow-up in 4-6 weeks to review sleep study results and discuss treatment options further. ?

## 2022-02-02 NOTE — Progress Notes (Signed)
Reviewed and agree with assessment/plan. ? ? ?Eirene Rather, MD ?[facility] ?02/02/2022, 11:15 AM ?Pager:  [phone] ? ?

## 2022-02-03 ENCOUNTER — Ambulatory Visit (INDEPENDENT_AMBULATORY_CARE_PROVIDER_SITE_OTHER): Payer: POS | Admitting: Rehabilitative and Restorative Service Providers"

## 2022-02-03 ENCOUNTER — Encounter: Payer: Self-pay | Admitting: Rehabilitative and Restorative Service Providers"

## 2022-02-03 DIAGNOSIS — M25562 Pain in left knee: Secondary | ICD-10-CM | POA: Diagnosis not present

## 2022-02-03 DIAGNOSIS — R600 Localized edema: Secondary | ICD-10-CM | POA: Diagnosis not present

## 2022-02-03 DIAGNOSIS — R262 Difficulty in walking, not elsewhere classified: Secondary | ICD-10-CM | POA: Diagnosis not present

## 2022-02-03 DIAGNOSIS — M25662 Stiffness of left knee, not elsewhere classified: Secondary | ICD-10-CM | POA: Diagnosis not present

## 2022-02-03 NOTE — Therapy (Signed)
?OUTPATIENT PHYSICAL THERAPY LOWER EXTREMITY EVALUATION ? ? ?Patient Name: Brandi Ortiz ?MRN: [id number] ?DOB:1971/10/17, 51 y.o., female ?Today's Date: 02/03/2022 ? ? PT End of Session - 02/03/22 1612   ? ? Visit Number 1   ? Number of Visits 12   ? Date for PT Re-Evaluation 03/31/22   ? PT Start Time 1515   ? PT Stop Time 5427   ? PT Time Calculation (min) 42 min   ? Activity Tolerance Patient tolerated treatment well   ? Behavior During Therapy [employer] for tasks assessed/performed   ? ?  ?  ? ?  ? ? ?Past Medical History:  ?Diagnosis Date  ? Allergy   ? Chicken pox   ? Heart murmur   ? ?Past Surgical History:  ?Procedure Laterality Date  ? CERVICAL BIOPSY  W/ LOOP ELECTRODE EXCISION  12/22/2011  ? ?Patient Active Problem List  ? Diagnosis Date Noted  ? 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 Psoriasis 02/01/2022  ? Loud snoring 02/01/2022  ? Plantar fasciitis 06/01/2021  ? Recurrent streptococcal tonsillitis 09/21/2020  ? Arthritis 07/05/2020  ? Eczema 07/05/2020  ? Candidal dermatitis 07/05/2020  ? Exertional headache 07/05/2020  ? Calculus of gallbladder without cholecystitis without obstruction 02/13/2019  ? 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 NAFLD (nonalcoholic fatty liver disease) 02/13/2019  ? Cardiac murmur 12/22/2018  ? Elevated LFTs 12/22/2018  ? Gastroesophageal reflux disease 12/22/2018  ? Vitamin D deficiency 04/17/2018  ? Uterine fibroid 04/15/2018  ? Allergic rhinitis 03/22/2017  ? Alopecia 03/22/2017  ? ? ?PCP: Lesleigh Noe, MD ? ?REFERRING PROVIDER: Lesleigh Noe, MD ? ?REFERRING DIAG: M25.562 (ICD-10-CM) - Acute pain of left knee  ? ?THERAPY DIAG:  ?Difficulty in walking, not elsewhere classified ? ?Localized edema ? ?Stiffness of left knee, not elsewhere classified ? ?Acute pain of left knee ? ?ONSET DATE: Early March 2023 ? ?SUBJECTIVE:  ? ?SUBJECTIVE STATEMENT: ?Brandi Ortiz has had L knee pain since early March of this year.  She noticed that using a compression stocking and meloxicam  helped.  She would like to return to walk/jogging and the gym but does not think her L knee is up to it. ? ?PERTINENT HISTORY: ?Psoriatic arthritis, osteoarthritis ? ?PAIN:  ?Are you having pain? Yes: NPRS scale: 2-4/10 ?Pain location: L knee ?Pain description: Ache, sore ?Aggravating factors: Overuse, running, too much WB ?Relieving factors: Rest, meloxicam, compression sock/sleve ? ?PRECAUTIONS: None ? ?WEIGHT BEARING RESTRICTIONS No ? ?FALLS:  ?Has patient fallen in last 6 months? No ? ?LIVING ENVIRONMENT: ?Lives with: lives alone ?Lives in: House/apartment ?Stairs: Yes: Internal: 20 steps; can reach both ?Has following equipment at home: None ? 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 ? ?PLOF: Independent ? ?PATIENT GOALS Return to walking. ? ? ?OBJECTIVE:  ? ?DIAGNOSTIC FINDINGS: No fracture or malalignment. Small knee effusion. The joint spaces ?are relatively patent. Minimal spurring at the lateral ?patellofemoral joint space. ? ?PATIENT SURVEYS:  ?FOTO 54 (Goal 70 in 12 visits) ? ?COGNITION: ? Overall cognitive status: Within functional limits for tasks assessed   ?  ?SENSATION: ?No numbness or tingling. ? ? ?LE ROM: ? ?Active ROM Right ?02/03/2022 Left ?02/03/2022  ?Hip flexion    ?Hip extension    ?Hip abduction    ?Hip adduction    ?Hip internal rotation    ?Hip external rotation    ?Knee flexion 120 114  ?Knee extension 0 -6  ?Ankle dorsiflexion    ?Ankle plantarflexion    ?Ankle inversion    ?  Ankle eversion    ? (Blank rows = not tested) ? ?LE Strength: ? ?Strength in pounds measured with hand held dynamometer Right ?02/03/2022 Left ?02/03/2022  ?Hip flexion    ?Hip extension    ?Hip abduction    ?Hip adduction    ?Hip internal rotation    ?Hip external rotation    ?Knee flexion    ?Knee extension 62.3  51.6  ?Ankle dorsiflexion    ?Ankle plantarflexion    ?Ankle inversion    ?Ankle eversion    ? (Blank rows = not tested) ? ?TODAY'S TREATMENT: ?02/03/2022 ?Quadriceps sets 2 sets of 10 for 5  seconds ?Seated straight leg raises 3 sets of 5 for 3 seconds (slow eccentrics) ? ?Functional Activities: Review knee anatomy, imaging, HEP and answer questions regarding workouts. ? ? ?PATIENT EDUCATION:  ?Education details: See above ?Person educated: Patient ?Education method: Explanation, Demonstration, Tactile cues, Verbal cues, and Handouts ?Education comprehension: verbalized understanding, returned demonstration, verbal cues required, tactile cues required, and needs further education ? ? ?HOME EXERCISE PROGRAM: ?Access Code: [email or web address] ?URL: [email or web address] ?Date: 02/03/2022 ?Prepared by: Vista Mink ? ?Exercises ?- Supine Quadricep Sets  - 2-3 x daily - 7 x weekly - 2-3 sets - 10 reps - 5 second hold ?- Small Range Straight Leg Raise  - 2 x daily - 7 x weekly - 3-5 sets - 5 reps - 3 seconds hold ? ?ASSESSMENT: ? ?CLINICAL IMPRESSION: ?Patient is a 51 y.o. female who was seen today for physical therapy evaluation and treatment for L knee pain.  ? ? ?OBJECTIVE IMPAIRMENTS Abnormal gait, decreased activity tolerance, decreased endurance, decreased knowledge of condition, decreased mobility, difficulty walking, decreased ROM, decreased strength, increased edema, impaired perceived functional ability, obesity, and pain.  ? ?ACTIVITY LIMITATIONS community activity and workouts .  ? ?PERSONAL FACTORS Psoriatic arthritis, osteoarthritis and obesity are also affecting patient's functional outcome.  ? ? ?REHAB POTENTIAL: Good ? ?CLINICAL DECISION MAKING: Stable/uncomplicated ? ?EVALUATION COMPLEXITY: Low ? ? ?GOALS: ?Goals reviewed with patient? Yes ? ?SHORT TERM GOALS: Target date: 03/03/2022 ? ?Brandi Ortiz will be independent with her day 1 HEP. ?Baseline: Started 02/03/2022 ?Goal status: INITIAL ? ?2.  Brandi Ortiz will have knee extension AROM of -3 or better. ?Baseline: -6 degrees ?Goal status: INITIAL ? ?3.  Improve L knee flexion AROM to 117 or better. ?Baseline: 114 degrees ?Goal status:  INITIAL ? ? ? ?LONG TERM GOALS: Target date: 03/17/2022 ? ?Improve FOTO to 70. ?Baseline: 54 ?Goal status: INITIAL ? ?2.  Brandi Ortiz will report L knee pain consistently 0-3/10 on the VAS. ?Baseline: Can be 5/10 ?Goal status: INITIAL ? ?3.  Improve L quadriceps strength to at least 70 pounds. ?Baseline: 51.6 pounds ?Goal status: INITIAL ? ?4.  Vista will be independent with her long-term HEP at [place]. ?Baseline: Started 02/03/2022 ?Goal status: INITIAL ? ? ?PLAN: ?PT FREQUENCY: 1-2x/week ? ?PT DURATION: 6 weeks ? ?PLANNED INTERVENTIONS: Therapeutic exercises, Therapeutic activity, Neuromuscular re-education, Balance training, Gait training, Patient/Family education, Stair training, Electrical stimulation, Cryotherapy, and Vasopneumatic device ? ?PLAN FOR NEXT SESSION: Review HEP, progress partial and full WB quadriceps strengthening (bike, leg press, knee extensions 90-40 degrees, balance). ? ? ?Farley Ly, PT, MPT ?02/03/2022, 4:30 PM  ?

## 2022-02-15 ENCOUNTER — Other Ambulatory Visit: Payer: Self-pay | Admitting: Family Medicine

## 2022-02-15 DIAGNOSIS — M25462 Effusion, left knee: Secondary | ICD-10-CM

## 2022-02-15 NOTE — Telephone Encounter (Signed)
Last office visit 01/19/2022 for effusion of left knee.  Last refilled 01/19/22 for #30 with no refills.  No future appointments with PCP.  ?

## 2022-02-16 ENCOUNTER — Encounter: Payer: POS | Admitting: Rehabilitative and Restorative Service Providers"

## 2022-02-21 ENCOUNTER — Encounter: Payer: Self-pay | Admitting: Family Medicine

## 2022-02-21 ENCOUNTER — Ambulatory Visit
Admission: RE | Admit: 2022-02-21 | Discharge: 2022-02-21 | Disposition: A | Discharge status: Home / Self Care | Payer: POS | Source: Ambulatory Visit | Attending: Family Medicine | Admitting: Family Medicine

## 2022-02-21 DIAGNOSIS — Z1231 Encounter for screening mammogram for malignant neoplasm of breast: Secondary | ICD-10-CM

## 2022-02-24 ENCOUNTER — Other Ambulatory Visit: Payer: Self-pay | Admitting: Family Medicine

## 2022-02-24 ENCOUNTER — Inpatient Hospital Stay
Admission: RE | Admit: 2022-02-24 | Discharge: 2022-02-24 | Disposition: A | Discharge status: Home / Self Care | Payer: Self-pay | Source: Ambulatory Visit | Attending: *Deleted | Admitting: *Deleted

## 2022-02-24 ENCOUNTER — Other Ambulatory Visit: Payer: Self-pay | Admitting: *Deleted

## 2022-02-24 DIAGNOSIS — Z1231 Encounter for screening mammogram for malignant neoplasm of breast: Secondary | ICD-10-CM

## 2022-02-24 DIAGNOSIS — N63 Unspecified lump in unspecified breast: Secondary | ICD-10-CM

## 2022-02-24 DIAGNOSIS — R928 Other abnormal and inconclusive findings on diagnostic imaging of breast: Secondary | ICD-10-CM

## 2022-02-27 ENCOUNTER — Encounter: Payer: Self-pay | Admitting: Family Medicine

## 2022-03-02 ENCOUNTER — Ambulatory Visit: Payer: POS | Admitting: Rehabilitative and Restorative Service Providers"

## 2022-03-02 ENCOUNTER — Encounter: Payer: Self-pay | Admitting: Rehabilitative and Restorative Service Providers"

## 2022-03-02 DIAGNOSIS — M25662 Stiffness of left knee, not elsewhere classified: Secondary | ICD-10-CM | POA: Diagnosis not present

## 2022-03-02 DIAGNOSIS — R600 Localized edema: Secondary | ICD-10-CM | POA: Diagnosis not present

## 2022-03-02 DIAGNOSIS — M25562 Pain in left knee: Secondary | ICD-10-CM

## 2022-03-02 DIAGNOSIS — R262 Difficulty in walking, not elsewhere classified: Secondary | ICD-10-CM | POA: Diagnosis not present

## 2022-03-02 NOTE — Therapy (Addendum)
OUTPATIENT PHYSICAL THERAPY TREATMENT NOTE   Patient Name: Brandi Ortiz MRN: [id number] DOB:Oct 03, 1971, 51 y.o., female Today's Date: 03/02/2022  PCP: Lesleigh Noe, MD REFERRING PROVIDER: Lesleigh Noe, MD  PHYSICAL THERAPY DISCHARGE SUMMARY  Visits from Start of Care: 2  Current functional level related to goals / functional outcomes: See note   Remaining deficits: See note   Education / Equipment: HEP   Patient agrees to discharge. Patient goals were partially met. Patient is being discharged due to not returning since the last visit.   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   Psoriasis 02/01/2022   Loud snoring 02/01/2022   Plantar fasciitis 06/01/2021   Recurrent streptococcal tonsillitis 09/21/2020   Arthritis 07/05/2020   Eczema 07/05/2020   Candidal dermatitis 07/05/2020   Exertional headache 07/05/2020   Calculus of gallbladder without cholecystitis without obstruction 02/13/2019   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   NAFLD (nonalcoholic fatty liver disease) 02/13/2019   Cardiac murmur 12/22/2018   Elevated LFTs 12/22/2018   Gastroesophageal reflux disease 12/22/2018   Vitamin D deficiency 04/17/2018   Uterine fibroid 04/15/2018   Allergic rhinitis 03/22/2017   Alopecia 03/22/2017    REFERRING DIAG: M25.562 (ICD-10-CM) - Acute pain of left knee    THERAPY DIAG:  Difficulty in walking, not elsewhere classified  Stiffness of left knee, not elsewhere classified  Acute pain of left knee  Localized edema  PERTINENT HISTORY: Psoriatic arthritis, osteoarthritis  PRECAUTIONS: None  SUBJECTIVE: Brandi Ortiz reports good early HEP compliance.  Overall progress is noted, although stiffness, medial and inferior knee pain are noted.  PAIN:  Are you having pain? Yes: NPRS scale: 0-3/10/10 Pain location: Medial knee and inferior patellar pole of L knee Pain description: Ache Aggravating factors: Prolonged postures (sitting) Relieving factors: Exercises and change of position   OBJECTIVE: (objective measures completed at initial evaluation unless otherwise dated)  OBJECTIVE:    DIAGNOSTIC FINDINGS: No fracture or malalignment. Small knee effusion. The joint spaces are relatively patent. Minimal spurring at the lateral patellofemoral joint space.   PATIENT SURVEYS:  FOTO 54 (Goal 70 in 12 visits)   COGNITION:           Overall cognitive status: Within functional limits for tasks assessed                          SENSATION: No numbness or tingling.     LE ROM:   Active ROM Right 02/03/2022 Left 02/03/2022 Left 03/02/2022  Hip flexion       Hip extension       Hip abduction       Hip adduction       Hip  internal rotation       Hip external rotation       Knee flexion 120 114 126  Knee extension 0 -6 -2  Ankle dorsiflexion       Ankle plantarflexion       Ankle inversion       Ankle eversion        (Blank rows = not tested)   LE Strength:   Strength in pounds measured with hand held dynamometer Right 02/03/2022 Left 02/03/2022  Hip flexion      Hip extension      Hip abduction      Hip adduction      Hip internal rotation      Hip external rotation      Knee flexion      Knee extension 62.3  51.6  Ankle dorsiflexion      Ankle plantarflexion      Ankle inversion      Ankle eversion       (Blank rows = not  tested)   TODAY'S TREATMENT: 03/02/2022 Recumbent bike Seat 5 for 8 minutes Level 3 Seated straight leg raises 3 sets of 5 for 3 seconds (slow eccentrics) Sit to stand slow eccentrics 10X Knee extension machine 5X each Up both down with 1 leg (1 sets of 5 each side) 20# slow eccentrics  Neuromuscular re-education: Tandem balance 6X 20 seconds B  Functional Activities: Discussed other gym activities (specifically leg press single and double leg with slow eccentrics)   02/03/2022 Quadriceps sets 2 sets of 10 for 5 seconds Seated straight leg raises 3 sets of 5 for 3 seconds (slow eccentrics)   Functional Activities: Review knee anatomy, imaging, HEP and answer questions regarding workouts.     PATIENT EDUCATION:  Education details: See above Person educated: Patient Education method: Explanation, Demonstration, Tactile cues, Verbal cues, and Handouts Education comprehension: verbalized understanding, returned demonstration, verbal cues required, tactile cues required, and needs further education     HOME EXERCISE PROGRAM:  Access Code: [email or web address] URL: [email or web address] Date: 03/02/2022 Prepared by: Vista Mink  Exercises - Supine Quadricep Sets  - 2-3 x daily - 7 x weekly - 2-3 sets - 10 reps - 5 second hold - Small Range Straight Leg Raise  - 2 x daily - 7 x weekly - 3-5 sets - 5 reps - 3 seconds hold - Sit to Stand with Armchair  - 2 x daily - 7 x weekly - 1 sets - 10 reps - Tandem Stance  - 1 x daily - 7 x weekly - 1 sets - 4-5 reps - 20 second hold  ASSESSMENT:   CLINICAL IMPRESSION: Marra notes early progress with her L knee pain since starting PT.  Objective AROM is good.  We will re-check quadriceps strength and FOTO next visit and make additional recommendations.     OBJECTIVE IMPAIRMENTS Abnormal gait, decreased activity tolerance, decreased endurance, decreased knowledge of condition, decreased mobility, difficulty walking, decreased ROM,  decreased strength, increased edema, impaired perceived functional ability, obesity, and pain.    ACTIVITY LIMITATIONS community activity and workouts .    PERSONAL FACTORS Psoriatic arthritis, osteoarthritis and obesity are also affecting patient's functional outcome.      REHAB POTENTIAL: Good   CLINICAL DECISION MAKING: Stable/uncomplicated   EVALUATION COMPLEXITY: Low     GOALS: Goals reviewed with patient? Yes   SHORT TERM GOALS: Target date: 03/03/2022   [name] will be independent with her day 1 HEP. Baseline: Started 02/03/2022 Goal status: Met  03/02/2022   2.  [name] will have knee extension AROM of -3 or better. Baseline: -6 degrees Goal status: Met 03/02/2022 3.  Improve L knee flexion AROM to 117 or better. Baseline: 114 degrees Goal status: Met 03/02/2022       LONG TERM GOALS: Target date: 03/17/2022   Improve FOTO to 70. Baseline: 54 Goal status: INITIAL   2.  [name] will report L knee pain consistently 0-3/10 on the VAS. Baseline: Can be 5/10 Goal status: INITIAL   3.  Improve L quadriceps strength to at least 70 pounds. Baseline: 51.6 pounds Goal status: INITIAL   4.  Jazmaine will be independent with her long-term HEP at [place]. Baseline: Started 02/03/2022 Goal status: INITIAL     PLAN: PT FREQUENCY: 1-2x/week   PT DURATION: 6 weeks   PLANNED INTERVENTIONS: Therapeutic exercises, Therapeutic activity, Neuromuscular re-education, Balance training, Gait training, Patient/Family education, Stair training, Electrical stimulation, Cryotherapy, and Vasopneumatic device   PLAN FOR NEXT SESSION: Review HEP, reassess quadriceps strength, FOTO.    Farley Ly, PT, MPT 03/02/2022, 2:47 PM

## 2022-03-08 ENCOUNTER — Ambulatory Visit
Admission: RE | Admit: 2022-03-08 | Discharge: 2022-03-08 | Disposition: A | Discharge status: Home / Self Care | Payer: POS | Source: Ambulatory Visit | Attending: Family Medicine | Admitting: Family Medicine

## 2022-03-08 DIAGNOSIS — R928 Other abnormal and inconclusive findings on diagnostic imaging of breast: Secondary | ICD-10-CM | POA: Diagnosis not present

## 2022-03-08 DIAGNOSIS — N63 Unspecified lump in unspecified breast: Secondary | ICD-10-CM | POA: Diagnosis present

## 2022-03-17 ENCOUNTER — Emergency Department (HOSPITAL_BASED_OUTPATIENT_CLINIC_OR_DEPARTMENT_OTHER)
Admission: EM | Admit: 2022-03-17 | Discharge: 2022-03-17 | Disposition: A | Discharge status: Home / Self Care | Payer: POS | Attending: Emergency Medicine | Admitting: Emergency Medicine

## 2022-03-17 ENCOUNTER — Other Ambulatory Visit: Payer: Self-pay

## 2022-03-17 ENCOUNTER — Encounter (HOSPITAL_BASED_OUTPATIENT_CLINIC_OR_DEPARTMENT_OTHER): Payer: Self-pay

## 2022-03-17 DIAGNOSIS — R300 Dysuria: Secondary | ICD-10-CM | POA: Diagnosis present

## 2022-03-17 DIAGNOSIS — N3091 Cystitis, unspecified with hematuria: Secondary | ICD-10-CM | POA: Diagnosis not present

## 2022-03-17 LAB — URINALYSIS, ROUTINE W REFLEX MICROSCOPIC
Bilirubin Urine: NEGATIVE
Glucose, UA: NEGATIVE mg/dL
Ketones, ur: NEGATIVE mg/dL
Nitrite: NEGATIVE
Protein, ur: 30 mg/dL — AB
RBC / HPF: >50 RBC/hpf — ABNORMAL HIGH (ref 0–5)
Specific Gravity, Urine: 1.018 (ref 1.005–1.030)
WBC, UA: >50 WBC/hpf — ABNORMAL HIGH (ref 0–5)
pH: 6 (ref 5.0–8.0)

## 2022-03-17 MED ORDER — CEPHALEXIN 500 MG PO CAPS: 500.0000 mg | ORAL_CAPSULE | Freq: Two times a day (BID) | ORAL | 0 refills | Status: AC

## 2022-03-17 MED ORDER — CEPHALEXIN 250 MG PO CAPS
500.0000 mg | ORAL_CAPSULE | Freq: Once | ORAL | Status: AC
  Administered 2022-03-17: 500 mg via ORAL
  Filled 2022-03-17: qty 2

## 2022-03-17 NOTE — ED Triage Notes (Signed)
Reports suprapubic pains beginning yesterday with mild right flank pain earlier in the day.   Hematuria that began this morning around 1AM. Has dysuria and pressure after urinating.

## 2022-03-17 NOTE — ED Notes (Signed)
Lab notified of urine culture add on 

## 2022-03-17 NOTE — ED Provider Notes (Signed)
[facility]  Provider Note  CSN: [id number] Arrival date & time: 03/17/22 0144  History Chief Complaint  Patient presents with   Hematuria    Brandi Ortiz is a 51 y.o. female with history of occasional UTI, reports onset of some dysuria, frequency and sensation of incomplete voiding early yesterday morning which has persisted during the day. She reports she noticed blood in her urine tonight which is unusual for her prompting her to come to the ED. Denies any fever, nausea or vomiting. She has had some mild flank pain but she did not think it was related. She is otherwise in her usual state of health.    Home Medications Prior to Admission medications   Medication Sig Start Date End Date Taking? Authorizing Provider  cephALEXin (KEFLEX) 500 MG capsule Take 1 capsule (500 mg total) by mouth 2 (two) times daily for 7 days. 03/17/22 03/24/22 Yes Truddie Hidden, MD  Ascorbic Acid (VITAMIN C PO) Take by mouth.    [provider]  clobetasol (TEMOVATE) 0.05 % GEL Apply 1 application topically daily as needed. 07/05/20   Lesleigh Noe, MD  Elderberry 575 MG/5ML SYRP Take by mouth.    [provider]  famotidine (PEPCID) 20 MG tablet Take by mouth. 04/20/19   [provider]  fexofenadine (ALLEGRA) 180 MG tablet Take 180 mg by mouth daily.    [provider]  fluticasone (CUTIVATE) 0.05 % cream Apply topically daily as needed. 07/05/20   Lesleigh Noe, MD  fluticasone (FLONASE) 50 MCG/ACT nasal spray Place into both nostrils daily.    [provider]  Glucosamine Sulfate (SYNOVACIN PO) Glucosamine  1 tablet daily    [provider]  Iron-Vitamin C (VITRON-C PO) Take by mouth daily.    [provider]  ketoconazole (NIZORAL) 2 % cream Apply topically daily as needed for irritation. 07/05/20   Lesleigh Noe, MD  meloxicam (MOBIC) 7.5 MG tablet TAKE 1 TABLET BY MOUTH EVERY DAY 02/16/22   Lesleigh Noe, MD  Omega-3 Fatty Acids (FISH OIL) 500 MG CAPS Take by mouth as needed.    [provider]  Turmeric 500 MG CAPS Take by mouth daily.     [provider]  vitamin B-12 (CYANOCOBALAMIN) 100 MCG tablet Take 300 mcg by mouth daily.    [provider]     Allergies    Patient has no known allergies.   Review of Systems   Review of Systems Please see HPI for pertinent positives and negatives  Physical Exam BP 121/82   Pulse 74   Temp 98.1 F (36.7 C) (Oral)   Resp 18   Ht '5\' 6"'$  (1.676 m)   Wt 117.9 kg   LMP 04/05/2018 (Exact Date)   SpO2 99%   BMI 41.97 kg/m   Physical Exam Vitals and nursing note reviewed.  Constitutional:      Appearance: Normal appearance.  HENT:     Head: Normocephalic and atraumatic.     Nose: Nose normal.     Mouth/Throat:     Mouth: Mucous membranes are moist.  Eyes:     Extraocular Movements: Extraocular movements intact.     Conjunctiva/sclera: Conjunctivae normal.  Cardiovascular:     Rate and Rhythm: Normal rate.  Pulmonary:     Effort: Pulmonary effort is normal.     Breath sounds: Normal breath sounds.  Abdominal:     General: Abdomen is flat.     Palpations:  Abdomen is soft.     Tenderness: There is abdominal tenderness (mild suprapubic).  Musculoskeletal:        General: No swelling. Normal range of motion.     Cervical back: Neck supple.  Skin:    General: Skin is warm and dry.  Neurological:     General: No focal deficit present.     Mental Status: She is alert.  Psychiatric:        Mood and Affect: Mood normal.    ED Results / Procedures / Treatments   EKG None  Procedures Procedures  Medications Ordered in the ED Medications  cephALEXin (KEFLEX) capsule 500 mg (500 mg Oral Given 03/17/22 0327)    Initial Impression and Plan  Patient here with symptoms consistent with cystitis, also having some blood. No symptoms of renal colic. She denies any vaginal bleeding or discharge. She  is otherwise well appearing. If UA shows infection. Plan oral abx. She reports she had had to have her Abx switched with prior UTI, presumably due to resistance, but she cannot recall what she was on.   ED Course   Clinical Course as of 03/17/22 0332  Fri Mar 17, 2022  0330 UA is consistent with UTI, given reports of possible resistant infection in the past, will send for culture. Begin Keflex in the meantime, PCP follow up. RTED for any other concerns. [CS]    Clinical Course User Index [CS] Truddie Hidden, MD     MDM Rules/Calculators/A&P Medical Decision Making Problems Addressed: Hemorrhagic cystitis: acute illness or injury  Amount and/or Complexity of Data Reviewed Labs: ordered. Decision-making details documented in ED Course.  Risk Prescription drug management.    Final Clinical Impression(s) / ED Diagnoses Final diagnoses:  Hemorrhagic cystitis    Rx / DC Orders ED Discharge Orders          Ordered    cephALEXin (KEFLEX) 500 MG capsule  2 times daily        03/17/22 0332             Truddie Hidden, MD 03/17/22 [phone]

## 2022-03-19 LAB — URINE CULTURE: Culture: >=100000 — AB

## 2022-03-28 ENCOUNTER — Encounter: Payer: Self-pay | Admitting: Primary Care

## 2022-04-04 ENCOUNTER — Encounter: Payer: Self-pay | Admitting: Family Medicine

## 2022-04-10 ENCOUNTER — Ambulatory Visit: Payer: POS | Admitting: Family Medicine

## 2022-04-10 VITALS — BP 120/80 | HR 77 | Temp 97.4°F | Ht 66.0 in | Wt 263.5 lb

## 2022-04-10 DIAGNOSIS — N390 Urinary tract infection, site not specified: Secondary | ICD-10-CM

## 2022-04-10 DIAGNOSIS — R339 Retention of urine, unspecified: Secondary | ICD-10-CM

## 2022-04-10 LAB — POC URINALSYSI DIPSTICK (AUTOMATED)
Bilirubin, UA: NEGATIVE
Blood, UA: NEGATIVE
Glucose, UA: NEGATIVE
Ketones, UA: NEGATIVE
Leukocytes, UA: NEGATIVE
Nitrite, UA: NEGATIVE
Protein, UA: POSITIVE — AB
Spec Grav, UA: 1.01 (ref 1.010–1.025)
Urobilinogen, UA: 0.2 E.U./dL
pH, UA: 7.5 (ref 5.0–8.0)

## 2022-04-10 NOTE — Patient Instructions (Signed)
Urine retention  - Would recommend that you drink at least 64 oz water  - treat constipation - use your teat  If another urine infection or the current symptoms do not improve then call and we can have you see urology  Nocturia  - one probably OK - if worsening with hydration - do not drink 2 hours before and use the bathroom before you go to sleep

## 2022-04-10 NOTE — Assessment & Plan Note (Signed)
Persistent following recent UTI.  Encouraged increasing hydration.  She also endorses firm stool, discussed constipation as a trigger.  She will take a constipation tea.  If no improvement in symptoms advised follow-up and will consider referral to urology to evaluate for true retention.

## 2022-04-10 NOTE — Progress Notes (Signed)
Subjective:     Brandi Ortiz is a 51 y.o. female presenting for Urinary Retention (Since having a UTI on 04/03/22. Completed round of ABO)     HPI  #Urinary retention - will urinate but still feels like she needs to go - no burning with urination - no longer having hematuria - no frequency - some nocturia - but her "normal" amount - 2 times normally - just completed a course of nitrofurantoin - yesterday - symptoms resolved - will go a few hours later - does not drink enough water -   No hx of kidney stones Not sexually activity Did travel recently - [place]  No constipation or diarrhea  Review of Systems   Social History   Tobacco Use  Smoking Status Never  Smokeless Tobacco Never        Objective:    BP Readings from Last 3 Encounters:  04/10/22 120/80  03/17/22 110/77  02/01/22 128/80   Wt Readings from Last 3 Encounters:  04/10/22 263 lb 8 oz (119.5 kg)  03/17/22 260 lb (117.9 kg)  02/01/22 261 lb 12.8 oz (118.8 kg)    BP 120/80   Pulse 77   Temp (!) 97.4 F (36.3 C) (Temporal)   Ht '5\' 6"'$  (1.676 m)   Wt 263 lb 8 oz (119.5 kg)   LMP 03/23/2018   SpO2 96%   BMI 42.53 kg/m    Physical Exam Constitutional:      General: She is not in acute distress.    Appearance: She is well-developed. She is not diaphoretic.  HENT:     Head: Normocephalic and atraumatic.     Right Ear: External ear normal.     Left Ear: External ear normal.     Nose: Nose normal.  Eyes:     Conjunctiva/sclera: Conjunctivae normal.  Cardiovascular:     Rate and Rhythm: Normal rate and regular rhythm.     Heart sounds: Normal heart sounds.  Pulmonary:     Effort: Pulmonary effort is normal.  Abdominal:     General: Bowel sounds are normal. There is no distension.     Palpations: Abdomen is soft.     Tenderness: There is no abdominal tenderness. There is no guarding.  Musculoskeletal:     Cervical back: Neck supple.  Skin:    General: Skin is warm and dry.      Capillary Refill: Capillary refill takes less than 2 seconds.  Neurological:     Mental Status: She is alert. Mental status is at baseline.  Psychiatric:        Mood and Affect: Mood normal.        Behavior: Behavior normal.     UA: protein, no blood or LE or nitrites      Assessment & Plan:   Problem List Items Addressed This Visit       Genitourinary   Urinary retention - Primary    Persistent following recent UTI.  Encouraged increasing hydration.  She also endorses firm stool, discussed constipation as a trigger.  She will take a constipation tea.  If no improvement in symptoms advised follow-up and will consider referral to urology to evaluate for true retention.      Relevant Orders   POCT Urinalysis Dipstick (Automated) (Completed)   Other Visit Diagnoses     Recurrent UTI          With 2 back-to-back UTIs, discussed typically wait until 3 before urology referral.  No clear trigger.  Advised  watch and wait and update if she gets another infection.  Return if symptoms worsen or fail to improve.  Lesleigh Noe, MD

## 2022-04-11 ENCOUNTER — Ambulatory Visit: Payer: POS

## 2022-04-11 DIAGNOSIS — R0683 Snoring: Secondary | ICD-10-CM

## 2022-04-11 DIAGNOSIS — G4733 Obstructive sleep apnea (adult) (pediatric): Secondary | ICD-10-CM

## 2022-04-14 DIAGNOSIS — G4733 Obstructive sleep apnea (adult) (pediatric): Secondary | ICD-10-CM | POA: Diagnosis not present

## 2022-04-21 ENCOUNTER — Encounter: Payer: POS | Admitting: Rehabilitative and Restorative Service Providers"

## 2022-05-08 DIAGNOSIS — Z0289 Encounter for other administrative examinations: Secondary | ICD-10-CM

## 2022-05-25 ENCOUNTER — Encounter: Payer: Self-pay | Admitting: Family Medicine

## 2022-06-08 ENCOUNTER — Ambulatory Visit (INDEPENDENT_AMBULATORY_CARE_PROVIDER_SITE_OTHER): Payer: POS | Admitting: Bariatrics

## 2022-06-08 ENCOUNTER — Encounter (INDEPENDENT_AMBULATORY_CARE_PROVIDER_SITE_OTHER): Payer: Self-pay | Admitting: Bariatrics

## 2022-06-08 VITALS — BP 116/79 | HR 68 | Temp 98.0°F | Ht 66.0 in | Wt 259.0 lb

## 2022-06-08 DIAGNOSIS — Z1331 Encounter for screening for depression: Secondary | ICD-10-CM | POA: Diagnosis not present

## 2022-06-08 DIAGNOSIS — R5383 Other fatigue: Secondary | ICD-10-CM

## 2022-06-08 DIAGNOSIS — K760 Fatty (change of) liver, not elsewhere classified: Secondary | ICD-10-CM | POA: Diagnosis not present

## 2022-06-08 DIAGNOSIS — R0602 Shortness of breath: Secondary | ICD-10-CM | POA: Insufficient documentation

## 2022-06-08 DIAGNOSIS — K219 Gastro-esophageal reflux disease without esophagitis: Secondary | ICD-10-CM | POA: Diagnosis not present

## 2022-06-08 DIAGNOSIS — E559 Vitamin D deficiency, unspecified: Secondary | ICD-10-CM

## 2022-06-08 DIAGNOSIS — E66813 Obesity, class 3: Secondary | ICD-10-CM | POA: Insufficient documentation

## 2022-06-08 DIAGNOSIS — Z6841 Body Mass Index (BMI) 40.0 and over, adult: Secondary | ICD-10-CM | POA: Insufficient documentation

## 2022-06-08 DIAGNOSIS — Z0000 Encounter for general adult medical examination without abnormal findings: Secondary | ICD-10-CM | POA: Insufficient documentation

## 2022-06-08 DIAGNOSIS — R7309 Other abnormal glucose: Secondary | ICD-10-CM

## 2022-06-08 DIAGNOSIS — E669 Obesity, unspecified: Secondary | ICD-10-CM

## 2022-06-09 LAB — COMPREHENSIVE METABOLIC PANEL
ALT: 65 IU/L — ABNORMAL HIGH (ref 0–32)
AST: 47 IU/L — ABNORMAL HIGH (ref 0–40)
Albumin/Globulin Ratio: 1.6 (ref 1.2–2.2)
Albumin: 4.4 g/dL (ref 3.9–4.9)
Alkaline Phosphatase: 160 IU/L — ABNORMAL HIGH (ref 44–121)
BUN/Creatinine Ratio: 13 (ref 9–23)
BUN: 13 mg/dL (ref 6–24)
Bilirubin Total: 0.7 mg/dL (ref 0.0–1.2)
CO2: 22 mmol/L (ref 20–29)
Calcium: 9 mg/dL (ref 8.7–10.2)
Chloride: 99 mmol/L (ref 96–106)
Creatinine, Ser: 1.03 mg/dL — ABNORMAL HIGH (ref 0.57–1.00)
Globulin, Total: 2.8 g/dL (ref 1.5–4.5)
Glucose: 106 mg/dL — ABNORMAL HIGH (ref 70–99)
Potassium: 4.6 mmol/L (ref 3.5–5.2)
Sodium: 139 mmol/L (ref 134–144)
Total Protein: 7.2 g/dL (ref 6.0–8.5)
eGFR: 67 mL/min/{1.73_m2} (ref >59)

## 2022-06-09 LAB — HEMOGLOBIN A1C
Est. average glucose Bld gHb Est-mCnc: 100 mg/dL
Hgb A1c MFr Bld: 5.1 % (ref 4.8–5.6)

## 2022-06-09 LAB — TSH+T4F+T3FREE
Free T4: 0.96 ng/dL (ref 0.82–1.77)
T3, Free: 2.8 pg/mL (ref 2.0–4.4)
TSH: 1.55 u[IU]/mL (ref 0.450–4.500)

## 2022-06-09 LAB — VITAMIN D 25 HYDROXY (VIT D DEFICIENCY, FRACTURES): Vit D, 25-Hydroxy: 27.8 ng/mL — ABNORMAL LOW (ref 30.0–100.0)

## 2022-06-09 LAB — INSULIN, RANDOM: INSULIN: 39.3 u[IU]/mL — ABNORMAL HIGH (ref 2.6–24.9)

## 2022-06-12 ENCOUNTER — Encounter (INDEPENDENT_AMBULATORY_CARE_PROVIDER_SITE_OTHER): Payer: Self-pay | Admitting: Bariatrics

## 2022-06-12 DIAGNOSIS — E8881 Metabolic syndrome: Secondary | ICD-10-CM | POA: Insufficient documentation

## 2022-06-12 DIAGNOSIS — E88819 Insulin resistance, unspecified: Secondary | ICD-10-CM | POA: Insufficient documentation

## 2022-06-12 DIAGNOSIS — R748 Abnormal levels of other serum enzymes: Secondary | ICD-10-CM | POA: Insufficient documentation

## 2022-06-13 ENCOUNTER — Encounter (INDEPENDENT_AMBULATORY_CARE_PROVIDER_SITE_OTHER): Payer: Self-pay | Admitting: Bariatrics

## 2022-06-13 ENCOUNTER — Encounter: Payer: Self-pay | Admitting: Family Medicine

## 2022-06-19 NOTE — Progress Notes (Incomplete)
Chief Complaint:   OBESITY Brandi Ortiz (MR# [id number]) is a 51 y.o. female who presents for evaluation and treatment of obesity and related comorbidities. Current BMI is Body mass index is 41.8 kg/m. Brandi Ortiz has been struggling with her weight for many years and has been unsuccessful in either losing weight, maintaining weight loss, or reaching her healthy weight goal.  Brandi Ortiz is in the process of getting a sleep study.  Brandi Ortiz is currently in the action stage of change and ready to dedicate time achieving and maintaining a healthier weight. Brandi Ortiz is interested in becoming our patient and working on intensive lifestyle modifications including (but not limited to) diet and exercise for weight loss.  Brandi Ortiz's habits were reviewed today and are as follows: {MWM WT HABITS:23461}.  Depression Screen Brandi Ortiz's Food and Mood (modified PHQ-9) score was ***.     06/08/2022    7:59 AM  Depression screen PHQ 2/9  Decreased Interest 3  Down, Depressed, Hopeless 1  PHQ - 2 Score 4  Altered sleeping 3  Tired, decreased energy 2  Change in appetite 0  Feeling bad or failure about yourself  0  Trouble concentrating 1  Moving slowly or fidgety/restless 0  Suicidal thoughts 0  PHQ-9 Score 10  Difficult doing work/chores Very difficult   Subjective:   1. Other fatigue Brandi Ortiz {Actions; denies/reports/admits to:19208} daytime somnolence and {Actions; denies/reports/admits to:19208} waking up still tired. Patient has a history of symptoms of {OSA Sx:17850}. Brandi Ortiz generally gets {numbers (fuzzy):14653} hours of sleep per night, and states that she has {sleep quality:17851}. Snoring {is/are not:32546} present. Apneic episodes {is/are not:32546} present. Epworth Sleepiness Score is ***.   2. SOB (shortness of breath) on exertion Brandi Ortiz notes increasing shortness of breath with exercising and seems to be worsening over time with weight gain. She notes getting out of breath  sooner with activity than she used to. This has not gotten worse recently. Brandi Ortiz denies shortness of breath at rest or orthopnea.  3. NAFLD (nonalcoholic fatty liver disease) ***  4. Health care maintenance ***  5. Gastroesophageal reflux disease, unspecified whether esophagitis present ***  6. Vitamin D deficiency ***  7. Elevated glucose ***  Assessment/Plan:   1. Other fatigue Brandi Ortiz does feel that her weight is causing her energy to be lower than it should be. Fatigue may be related to obesity, depression or many other causes. Labs will be ordered, and in the meanwhile, Adalia will focus on self care including making healthy food choices, increasing physical activity and focusing on stress reduction.  - EKG 12-Lead - Hemoglobin A1c - Insulin, random - TSH+T4F+T3Free  2. SOB (shortness of breath) on exertion Brandi Ortiz does feel that she gets out of breath more easily that she used to when she exercises. Brandi Ortiz's shortness of breath appears to be obesity related and exercise induced. She has agreed to work on weight loss and gradually increase exercise to treat her exercise induced shortness of breath. Will continue to monitor closely.  - TSH+T4F+T3Free  3. NAFLD (nonalcoholic fatty liver disease) *** - Comprehensive metabolic panel  4. Health care maintenance *** - Comprehensive metabolic panel - Hemoglobin A1c - Insulin, random - TSH+T4F+T3Free - VITAMIN D 25 Hydroxy (Vit-D Deficiency, Fractures)  5. Gastroesophageal reflux disease, unspecified whether esophagitis present ***  6. Vitamin D deficiency *** - VITAMIN D 25 Hydroxy (Vit-D Deficiency, Fractures)  7. Elevated glucose *** - Hemoglobin A1c - Insulin, random  8. Depression screening Brandi Ortiz had a positive depression screening. Depression is  commonly associated with obesity and often results in emotional eating behaviors. We will monitor this closely and work on CBT to help improve the  non-hunger eating patterns. Referral to Psychology may be required if no improvement is seen as she continues in our clinic.  9. Obesity, Current BMI 41.8 Brandi Ortiz is currently in the action stage of change and her goal is to continue with weight loss efforts. I recommend Brandi Ortiz begin the structured treatment plan as follows:  She has agreed to the Category 3 Plan and keeping a food journal and adhering to recommended goals of 1500 calories and 90 grams of protein daily.  Meal planning and intentional eating were discussed.  Prepared breakfast options were given.  Food journaling sheet was given.  Exercise goals: No exercise has been prescribed at this time.   Behavioral modification strategies: increasing lean protein intake, decreasing simple carbohydrates, increasing vegetables, increasing water intake, decreasing eating out, no skipping meals, meal planning and cooking strategies, keeping healthy foods in the home, and planning for success.  She was informed of the importance of frequent follow-up visits to maximize her success with intensive lifestyle modifications for her multiple health conditions. She was informed we would discuss her lab results at her next visit unless there is a critical issue that needs to be addressed sooner. Brandi Ortiz agreed to keep her next visit at the agreed upon time to discuss these results.  Objective:   Blood pressure 116/79, pulse 68, temperature 98 F (36.7 C), height '5\' 6"'$  (1.676 m), weight 259 lb (117.5 kg), last menstrual period 03/23/2018, SpO2 98 %. Body mass index is 41.8 kg/m.  EKG: Normal sinus rhythm, rate 66 BPM.  Indirect Calorimeter completed today shows a VO2 of 285 and a REE of 1937.  Her calculated basal metabolic rate is 4259 thus her basal metabolic rate is worse than expected.  General: Cooperative, alert, well developed, in no acute distress. HEENT: Conjunctivae and lids unremarkable. Cardiovascular: Regular rhythm.  Lungs:  Normal work of breathing. Neurologic: No focal deficits.   Lab Results  Component Value Date   CREATININE 1.03 (H) 06/08/2022   BUN 13 06/08/2022   NA 139 06/08/2022   K 4.6 06/08/2022   CL 99 06/08/2022   CO2 22 06/08/2022   Lab Results  Component Value Date   ALT 65 (H) 06/08/2022   AST 47 (H) 06/08/2022   ALKPHOS 160 (H) 06/08/2022   BILITOT 0.7 06/08/2022   Lab Results  Component Value Date   HGBA1C 5.1 06/08/2022   HGBA1C 4.8 07/19/2021   HGBA1C 4.5 (L) 04/15/2018   Lab Results  Component Value Date   INSULIN 39.3 (H) 06/08/2022   Lab Results  Component Value Date   TSH 1.550 06/08/2022   Lab Results  Component Value Date   CHOL 175 07/19/2021   HDL 52.60 07/19/2021   LDLCALC 96 07/19/2021   TRIG 131.0 07/19/2021   CHOLHDL 3 07/19/2021   Lab Results  Component Value Date   WBC 5.8 07/19/2021   HGB 13.5 07/19/2021   HCT 41.9 07/19/2021   MCV 85.0 07/19/2021   PLT 200.0 07/19/2021   Lab Results  Component Value Date   FERRITIN 141.7 07/05/2020   Attestation Statements:   Reviewed by clinician on day of visit: allergies, medications, problem list, medical history, surgical history, family history, social history, and previous encounter notes.   Wilhemena Durie, am acting as Location manager for [employer], DO.  I have reviewed the above documentation for accuracy and  completeness, and I agree with the above. - ***

## 2022-06-19 NOTE — Progress Notes (Unsigned)
Chief Complaint:   OBESITY Brandi Ortiz (MR# [id number]) is a 51 y.o. female who presents for evaluation and treatment of obesity and related comorbidities. Current BMI is Body mass index is 41.8 kg/m. Brandi Ortiz has been struggling with her weight for many years and has been unsuccessful in either losing weight, maintaining weight loss, or reaching her healthy weight goal.  Brandi Ortiz is in the process of getting a sleep study.  Brandi Ortiz is currently in the action stage of change and ready to dedicate time achieving and maintaining a healthier weight. Brandi Ortiz is interested in becoming our patient and working on intensive lifestyle modifications including (but not limited to) diet and exercise for weight loss.  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  Depression Screen Brandi Ortiz's Food and Mood (modified PHQ-9) score was 10.     06/08/2022    7:59 AM  Depression screen PHQ 2/9  Decreased Interest 3  Down, Depressed, Hopeless 1  PHQ - 2 Score 4  Altered sleeping 3  Tired, decreased energy 2  Change in appetite 0  Feeling bad or failure about yourself  0  Trouble concentrating 1  Moving slowly or fidgety/restless 0  Suicidal thoughts 0  PHQ-9 Score 10  Difficult doing work/chores Very difficult   Subjective:   1. Other fatigue Taj admits to daytime somnolence and admits to waking up still tired. Patient has a history of symptoms of daytime fatigue and morning fatigue. Brandi Ortiz generally gets 5 or 6 hours of sleep per night, and states  that she has nightime awakenings. Snoring is present. Apneic episodes are not present. Epworth Sleepiness Score is 18.   2. SOB (shortness of breath) on exertion Brandi Ortiz notes increasing shortness of breath with exercising and seems to be worsening over time with weight gain. She notes getting out of breath sooner with activity than she used to. This has not gotten worse recently. Brandi Ortiz denies shortness of breath at rest or orthopnea.  3. NAFLD (nonalcoholic fatty liver disease) Brandi Ortiz has an ultrasound done. She is not on medications currently.   4. Health care maintenance Given obesity.   5. Gastroesophageal reflux disease, unspecified whether esophagitis present Brandi Ortiz's symptoms are currently controlled and she is taking famotidine.  6. Vitamin D deficiency Brandi Ortiz is not on vitamin D supplementation currently.  7. Elevated glucose Brandi Ortiz is not on medications currently.   Assessment/Plan:   1. Other fatigue Siddhi does feel that her weight is causing her energy to be lower than it should be. Fatigue may be related to obesity, depression or many other causes. Labs will be ordered, and in the meanwhile, Brandi Ortiz will focus on self care including making healthy food choices, increasing physical activity and focusing on stress reduction.  - EKG 12-Lead - Hemoglobin A1c - Insulin, random - TSH+T4F+T3Free  2. SOB (shortness of breath) on exertion Tasmia does feel that she gets out of breath more easily that she used to when she exercises. Cara's shortness of breath appears to be obesity related and exercise induced. She has agreed to work on weight loss and gradually increase exercise to treat her  exercise induced shortness of breath. Will continue to monitor closely.  - TSH+T4F+T3Free  3. NAFLD (nonalcoholic fatty liver disease) We will check labs today. Brandi Ortiz work on her meal plan and exercise.  - Comprehensive metabolic panel  4. Health care  maintenance We will check labs today.  EKG and IC were done and reviewed with the patient today.  - Comprehensive metabolic panel - Hemoglobin A1c - Insulin, random - TSH+T4F+T3Free - VITAMIN D 25 Hydroxy (Vit-D Deficiency, Fractures)  5. Gastroesophageal reflux disease, unspecified whether esophagitis present Brandi Ortiz continue her medications and will avoid trigger foods.  6. Vitamin D deficiency We will check labs today, and we will follow-up at Brandi Ortiz next visit.  - VITAMIN D 25 Hydroxy (Vit-D Deficiency, Fractures)  7. Elevated glucose We will check labs today, and we will follow-up at Brandi Ortiz next visit.  - Hemoglobin A1c - Insulin, random  8. Depression screening Brandi Ortiz had a positive depression screening. Depression is commonly associated with obesity and often results in emotional eating behaviors. We will monitor this closely and work on CBT to help improve the non-hunger eating patterns. Referral to Psychology may be required if no improvement is seen as she continues in our clinic.  9. Obesity, Current BMI 41.8 Brandi Ortiz is currently in the action stage of change and her goal is to continue with weight loss efforts. I recommend Brandi Ortiz begin the structured treatment plan as follows:  She has agreed to the Category 3 Plan and keeping a food journal and adhering to recommended goals of 1500 calories and 90 grams of protein daily.  Meal planning and intentional eating were discussed.  Prepared breakfast options were given.  Food journaling sheet was given.  Exercise goals: No exercise has been prescribed at this time.   Behavioral modification strategies: increasing lean protein intake, decreasing simple carbohydrates, increasing vegetables, increasing water intake, decreasing eating out, no skipping meals, meal planning and cooking strategies, keeping healthy foods in the home, and planning for success.  She was informed of the importance of frequent follow-up  visits to maximize her success with intensive lifestyle modifications for her multiple health conditions. She was informed we would discuss her lab results at her next visit unless there is a critical issue that needs to be addressed sooner. Brandi Ortiz agreed to keep her next visit at the agreed upon time to discuss these results.  Objective:   Blood pressure 116/79, pulse 68, temperature 98 F (36.7 C), height '5\' 6"'$  (1.676 m), weight 259 lb (117.5 kg), last menstrual period 03/23/2018, SpO2 98 %. Body mass index is 41.8 kg/m.  EKG: Normal sinus rhythm, rate 66 BPM.  Indirect Calorimeter completed today shows a VO2 of 285 and a REE of 1937.  Her calculated basal metabolic rate is 0947 thus her basal metabolic rate is worse than expected.  General: Cooperative, alert, well developed, in no acute distress. HEENT: Conjunctivae and lids unremarkable. Cardiovascular: Regular rhythm.  Lungs: Normal work of breathing. Neurologic: No focal deficits.   Lab Results  Component Value Date   CREATININE 1.03 (H) 06/08/2022   BUN 13 06/08/2022   NA 139 06/08/2022   K 4.6 06/08/2022   CL 99 06/08/2022   CO2 22 06/08/2022   Lab Results  Component Value Date   ALT 65 (H) 06/08/2022   AST 47 (H) 06/08/2022   ALKPHOS 160 (H) 06/08/2022   BILITOT 0.7 06/08/2022   Lab Results  Component Value Date   HGBA1C 5.1 06/08/2022   HGBA1C 4.8 07/19/2021  HGBA1C 4.5 (L) 04/15/2018   Lab Results  Component Value Date   INSULIN 39.3 (H) 06/08/2022   Lab Results  Component Value Date   TSH 1.550 06/08/2022   Lab Results  Component Value Date   CHOL 175 07/19/2021   HDL 52.60 07/19/2021   LDLCALC 96 07/19/2021   TRIG 131.0 07/19/2021   CHOLHDL 3 07/19/2021   Lab Results  Component Value Date   WBC 5.8 07/19/2021   HGB 13.5 07/19/2021   HCT 41.9 07/19/2021   MCV 85.0 07/19/2021   PLT 200.0 07/19/2021   Lab Results  Component Value Date   FERRITIN 141.7 07/05/2020   Attestation  Statements:   Reviewed by clinician on day of visit: allergies, medications, problem list, medical history, surgical history, family history, social history, and previous encounter notes.   Wilhemena Durie, am acting as Location manager for [employer], DO.  I have reviewed the above documentation for accuracy and completeness, and I agree with the above. Jearld Lesch, DO

## 2022-06-20 ENCOUNTER — Telehealth (INDEPENDENT_AMBULATORY_CARE_PROVIDER_SITE_OTHER): Payer: POS | Admitting: Primary Care

## 2022-06-20 DIAGNOSIS — G4730 Sleep apnea, unspecified: Secondary | ICD-10-CM

## 2022-06-20 NOTE — Patient Instructions (Signed)
Home sleep study showed that you have severe obstructive sleep apnea. You had on average 79 apneic/hypopneic events an hour  Treatment options include weight loss, oral appliance, CPAP therapy or referral to ENT for possible surgical options  Due to severity of your sleep apnea recommending that you be started on CPAP, you will need an in lab CPAP titration study to determine best pressure settings  When she receives CPAP please aim to wear every night for minimum 4 to 6 hours or longer  Do not drive if experiencing excessive daytime sleepiness or fatigue  Follow-up Please call when she receives CPAP to schedule a 1 to 61-monthfollow-up for compliance check   CPAP and BIPAP Information CPAP and BIPAP are methods that use air pressure to keep your airways open and to help you breathe well. CPAP and BIPAP use different amounts of pressure. Your health care provider will tell you whether CPAP or BIPAP would be more helpful for you. CPAP stands for "continuous positive airway pressure." With CPAP, the amount of pressure stays the same while you breathe in (inhale) and out (exhale). BIPAP stands for "bi-level positive airway pressure." With BIPAP, the amount of pressure will be higher when you inhale and lower when you exhale. This allows you to take larger breaths. CPAP or BIPAP may be used in the hospital, or your health care provider may want you to use it at home. You may need to have a sleep study before your health care provider can order a machine for you to use at home. What are the advantages? CPAP or BIPAP can be helpful if you have: Sleep apnea. Chronic obstructive pulmonary disease (COPD). Heart failure. Medical conditions that cause muscle weakness, including muscular dystrophy or amyotrophic lateral sclerosis (ALS). Other problems that cause breathing to be shallow, weak, abnormal, or difficult. CPAP and BIPAP are most commonly used for obstructive sleep apnea (OSA) to keep the  airways from collapsing when the muscles relax during sleep. What are the risks? Generally, this is a safe treatment. However, problems may occur, including: Irritated skin or skin sores if the mask does not fit properly. Dry or stuffy nose or nosebleeds. Dry mouth. Feeling gassy or bloated. Sinus or lung infection if the equipment is not cleaned properly. When should CPAP or BIPAP be used? In most cases, the mask only needs to be worn during sleep. Generally, the mask needs to be worn throughout the night and during any daytime naps. People with certain medical conditions may also need to wear the mask at other times, such as when they are awake. Follow instructions from your health care provider about when to use the machine. What happens during CPAP or BIPAP?  Both CPAP and BIPAP are provided by a small machine with a flexible plastic tube that attaches to a plastic mask that you wear. Air is blown through the mask into your nose or mouth. The amount of pressure that is used to blow the air can be adjusted on the machine. Your health care provider will set the pressure setting and help you find the best mask for you. Tips for using the mask Because the mask needs to be snug, some people feel trapped or closed-in (claustrophobic) when first using the mask. If you feel this way, you may need to get used to the mask. One way to do this is to hold the mask loosely over your nose or mouth and then gradually apply the mask more snugly. You can also gradually  increase the amount of time that you use the mask. Masks are available in various types and sizes. If your mask does not fit well, talk with your health care provider about getting a different one. Some common types of masks include: Full face masks, which fit over the mouth and nose. Nasal masks, which fit over the nose. Nasal pillow or prong masks, which fit into the nostrils. If you are using a mask that fits over your nose and you tend to  breathe through your mouth, a chin strap may be applied to help keep your mouth closed. Use a skin barrier to protect your skin as told by your health care provider. Some CPAP and BIPAP machines have alarms that may sound if the mask comes off or develops a leak. If you have trouble with the mask, it is very important that you talk with your health care provider about finding a way to make the mask easier to tolerate. Do not stop using the mask. There could be a negative impact on your health if you stop using the mask. Tips for using the machine Place your CPAP or BIPAP machine on a secure table or stand near an electrical outlet. Know where the on/off switch is on the machine. Follow instructions from your health care provider about how to set the pressure on your machine and when you should use it. Do not eat or drink while the CPAP or BIPAP machine is on. Food or fluids could get pushed into your lungs by the pressure of the CPAP or BIPAP. For home use, CPAP and BIPAP machines can be rented or purchased through home health care companies. Many different brands of machines are available. Renting a machine before purchasing may help you find out which particular machine works well for you. Your health insurance company may also decide which machine you may get. Keep the CPAP or BIPAP machine and attachments clean. Ask your health care provider for specific instructions. Check the humidifier if you have a dry stuffy nose or nosebleeds. Make sure it is working correctly. Follow these instructions at home: Take over-the-counter and prescription medicines only as told by your health care provider. Ask if you can take sinus medicine if your sinuses are blocked. Do not use any products that contain nicotine or tobacco. These products include cigarettes, chewing tobacco, and vaping devices, such as e-cigarettes. If you need help quitting, ask your health care provider. Keep all follow-up visits. This is  important. Contact a health care provider if: You have redness or pressure sores on your head, face, mouth, or nose from the mask or head gear. You have trouble using the CPAP or BIPAP machine. You cannot tolerate wearing the CPAP or BIPAP mask. Someone tells you that you snore even when wearing your CPAP or BIPAP. Get help right away if: You have trouble breathing. You feel confused. Summary CPAP and BIPAP are methods that use air pressure to keep your airways open and to help you breathe well. If you have trouble with the mask, it is very important that you talk with your health care provider about finding a way to make the mask easier to tolerate. Do not stop using the mask. There could be a negative impact to your health if you stop using the mask. Follow instructions from your health care provider about when to use the machine. This information is not intended to replace advice given to you by your health care provider. Make sure you discuss any  questions you have with your health care provider. Document Revised: 05/18/2021 Document Reviewed: 09/17/2020 Elsevier Patient Education  [place].

## 2022-06-20 NOTE — Progress Notes (Signed)
Virtual Visit via Video Note  I connected with Brandi Ortiz on 06/20/22 at 11:00 AM EDT by a video enabled telemedicine application and verified that I am speaking with the correct person using two identifiers.  Location: Patient: Home Provider: Office    I discussed the limitations of evaluation and management by telemedicine and the availability of in person appointments. The patient expressed understanding and agreed to proceed.  History of Present Illness: 51 year old female, never smoked.  Past medical history significant for GERD, cardiac murmur, obesity, vitamin D deficiency, elevated LFTs, psoriatic arthritis, known alcoholic fatty liver disease.  Previous [facility] pulmonary encounter:  02/01/2022 Patient presents today for sleep consult. She has symptoms of snoring and morning fatigue for the last 4 months. She wakes up tired as if she hasn't rested. Experiencing dry mouth, sleeps with her mouth open. She sleeps on her side with two pillows. Wears mouth guard for teeth grinding. Typical bedtime is between 10-11pm. It does not take her long to fall asleep. She wakes on average 2-3 times a night. She starts her day at either 6am if going to the gym or 10am. She has never had sleep study before. She is working on weight loss and interested in starting exercising regimen. Denies symptoms of narcolepsy, cataplexy or sleep walking.   Sleep questionnaire Symptoms- Daytime sleepiness/morning fatigue, snoring  Prior sleep study- No  Bedtime- 10-11pm Time to fall asleep- within minutes  Nocturnal awakenings- 2-3 Out of bed/start of day- varies, 6am or 10am  Weight changes- 30-40lbs  Do you operate heavy machinery- No Do you currently wear CPAP-No Do you current wear oxygen- No Epworth- 16  06/20/2022- Interim hx  Patient contacted today to review sleep study results.  She has symptoms of snoring and daytime sleepiness. She had a home sleep study on 04/11/2022 that showed evidence of severe  obstructive sleep apnea, AHI 79.5 an hour with SPO2 low 66% (average 92%). We reviewed treatment options, recommending she be started on CPAP d.t severity of OSA and her clinical symptoms. Patient is in agreement with plan.   Observations/Objective:  - Appears well, no overt respiratory symptoms   Assessment and Plan:  Severe OSA: - Symptoms of snoring and daytime sleepiness - HST 04/11/22 >> AHI 79.5/hour with SpO2 low 66% - We reviewed risks of untreated sleep apnea and treatment options. She is in agreement with starting CPAP therapy.  - Needs CPAP titration study in lab to determine optimal pressure settings and potential oxygen need - Advised she aim to wear CPAP every night for min 4-6 hours or longer   Follow Up Instructions:  - Advised patient follow-up 31-90 days after starting CPAP for compliance check    I discussed the assessment and treatment plan with the patient. The patient was provided an opportunity to ask questions and all were answered. The patient agreed with the plan and demonstrated an understanding of the instructions.   The patient was advised to call back or seek an in-person evaluation if the symptoms worsen or if the condition fails to improve as anticipated.  I provided 22 minutes of non-face-to-face time during this encounter.   Martyn Ehrich, NP

## 2022-06-20 NOTE — Progress Notes (Signed)
Reviewed and agree with assessment/plan.   Chesley Mires, MD [facility] Pulmonary/Critical Care 06/20/2022, 1:13 PM Pager:  [phone]

## 2022-06-21 ENCOUNTER — Encounter (INDEPENDENT_AMBULATORY_CARE_PROVIDER_SITE_OTHER): Payer: Self-pay | Admitting: Bariatrics

## 2022-06-22 ENCOUNTER — Encounter (INDEPENDENT_AMBULATORY_CARE_PROVIDER_SITE_OTHER): Payer: Self-pay | Admitting: Bariatrics

## 2022-06-22 ENCOUNTER — Encounter (INDEPENDENT_AMBULATORY_CARE_PROVIDER_SITE_OTHER): Payer: Self-pay

## 2022-06-22 ENCOUNTER — Telehealth (INDEPENDENT_AMBULATORY_CARE_PROVIDER_SITE_OTHER): Payer: Self-pay | Admitting: Bariatrics

## 2022-06-22 ENCOUNTER — Ambulatory Visit (INDEPENDENT_AMBULATORY_CARE_PROVIDER_SITE_OTHER): Payer: POS | Admitting: Bariatrics

## 2022-06-22 VITALS — BP 105/72 | HR 83 | Temp 98.1°F | Ht 66.0 in | Wt 253.0 lb

## 2022-06-22 DIAGNOSIS — R748 Abnormal levels of other serum enzymes: Secondary | ICD-10-CM

## 2022-06-22 DIAGNOSIS — E559 Vitamin D deficiency, unspecified: Secondary | ICD-10-CM

## 2022-06-22 DIAGNOSIS — E8881 Metabolic syndrome: Secondary | ICD-10-CM

## 2022-06-22 DIAGNOSIS — Z6841 Body Mass Index (BMI) 40.0 and over, adult: Secondary | ICD-10-CM

## 2022-06-22 DIAGNOSIS — R632 Polyphagia: Secondary | ICD-10-CM | POA: Diagnosis not present

## 2022-06-22 DIAGNOSIS — E669 Obesity, unspecified: Secondary | ICD-10-CM

## 2022-06-22 MED ORDER — VITAMIN D (ERGOCALCIFEROL) 1.25 MG (50000 UNIT) PO CAPS: 50000.0000 [IU] | ORAL_CAPSULE | ORAL | 0 refills | Status: DC

## 2022-06-22 MED ORDER — WEGOVY 0.25 MG/0.5ML ~~LOC~~ SOAJ: 0.2500 mg | SUBCUTANEOUS | 0 refills | Status: DC

## 2022-06-22 NOTE — Telephone Encounter (Signed)
Dr. Owens Shark - Prior authorization approved for [id number]. Effective: 05/23/2022 to 01/18/2023. Patient sent approval message via mychart.

## 2022-06-23 NOTE — Telephone Encounter (Signed)
Will forward to P[facility]s to get CPAP titration study scheduled.

## 2022-06-23 NOTE — Telephone Encounter (Signed)
Please see other patient email.

## 2022-07-04 NOTE — Progress Notes (Unsigned)
Chief Complaint:   OBESITY Brandi Ortiz is here to discuss her progress with her obesity treatment plan along with follow-up of her obesity related diagnoses. Brandi Ortiz is on the Category 3 Plan and keeping a food journal and adhering to recommended goals of 1500 calories and 90 grams of protein daily and states she is following her eating plan approximately 60% of the time. Brandi Ortiz states she is walking for 30 minutes 3 times per week.  Today's visit was #: 2 Starting weight: 259 lbs Starting date: 06/08/2022 Today's weight: 253 lbs Today's date: 06/22/2022 Total lbs lost to date: 6 Total lbs lost since last in-office visit: 6  Interim History: Brandi Ortiz is down about 6 lbs since her first visit. She is using [employer]. She complains of plantar fascitis, and she is doing daily stretching and using ice.   Subjective:   1. Elevated liver enzymes Brandi Ortiz has a history of fatty liver. Last AST was 47 and ALT 65.  2. Vitamin D deficiency Brandi Ortiz's last Vitamin D level was 27.8. She is not on Vitamin D supplement.   3. Insulin resistance Brandi Ortiz's last A1c was 5.1 and insulin 39.3.  4. Polyphagia Brandi Ortiz took phentermine in the past. She denies contradictions.   Assessment/Plan:   1. Elevated liver enzymes We will recheck labs in 6 weeks.   2. Vitamin D deficiency Brandi Ortiz agreed to start prescription Vitamin D 50,000 IU once weekly, with no refills.   - Vitamin D, Ergocalciferol, (DRISDOL) 1.25 MG (50000 UNIT) CAPS capsule; Take 1 capsule (50,000 Units total) by mouth every 7 (seven) days.  Dispense: 5 capsule; Refill: 0  3. Insulin resistance Brandi Ortiz will continue her meal plan, and will increase her activities.   4. Polyphagia Brandi Ortiz agreed to start Wegovy 0.25 mg once weekly, with no refills.   - Semaglutide-Weight Management (WEGOVY) 0.25 MG/0.5ML SOAJ; Inject 0.25 mg into the skin once a week.  Dispense: 2 mL; Refill: 0  5. Obesity, Current BMI  40.9 Brandi Ortiz is currently in the action stage of change. As such, her goal is to continue with weight loss efforts. She has agreed to the Category 3 Plan and keeping a food journal and adhering to recommended goals of 1500 calories and 90 grams of protein daily.   Meal plan was discussed. Reviewed labs with the patient today from 06/08/2022, CMP, Vitamin D, A1c, insulin, and thyroid panel.   Exercise goals: As is.  Behavioral modification strategies: increasing lean protein intake, decreasing simple carbohydrates, increasing vegetables, increasing water intake, decreasing eating out, no skipping meals, meal planning and cooking strategies, keeping healthy foods in the home, and planning for success.  Brandi Ortiz has agreed to follow-up with our clinic in 3 weeks with Dr. Valetta Close. She was informed of the importance of frequent follow-up visits to maximize her success with intensive lifestyle modifications for her multiple health conditions.   Objective:   Blood pressure 105/72, pulse 83, temperature 98.1 F (36.7 C), height '5\' 6"'$  (1.676 m), weight 253 lb (114.8 kg), last menstrual period 03/23/2018, SpO2 94 %. Body mass index is 40.84 kg/m.  General: Cooperative, alert, well developed, in no acute distress. HEENT: Conjunctivae and lids unremarkable. Cardiovascular: Regular rhythm.  Lungs: Normal work of breathing. Neurologic: No focal deficits.   Lab Results  Component Value Date   CREATININE 1.03 (H) 06/08/2022   BUN 13 06/08/2022   NA 139 06/08/2022   K 4.6 06/08/2022   CL 99 06/08/2022   CO2 22 06/08/2022   Lab Results  Component Value Date   ALT 65 (H) 06/08/2022   AST 47 (H) 06/08/2022   ALKPHOS 160 (H) 06/08/2022   BILITOT 0.7 06/08/2022   Lab Results  Component Value Date   HGBA1C 5.1 06/08/2022   HGBA1C 4.8 07/19/2021   HGBA1C 4.5 (L) 04/15/2018   Lab Results  Component Value Date   INSULIN 39.3 (H) 06/08/2022   Lab Results  Component Value Date   TSH 1.550  06/08/2022   Lab Results  Component Value Date   CHOL 175 07/19/2021   HDL 52.60 07/19/2021   LDLCALC 96 07/19/2021   TRIG 131.0 07/19/2021   CHOLHDL 3 07/19/2021   Lab Results  Component Value Date   VD25OH 27.8 (L) 06/08/2022   VD25OH 29.84 (L) 07/19/2021   VD25OH 28.66 (L) 07/05/2020   Lab Results  Component Value Date   WBC 5.8 07/19/2021   HGB 13.5 07/19/2021   HCT 41.9 07/19/2021   MCV 85.0 07/19/2021   PLT 200.0 07/19/2021   Lab Results  Component Value Date   FERRITIN 141.7 07/05/2020   Attestation Statements:   Reviewed by clinician on day of visit: allergies, medications, problem list, medical history, surgical history, family history, social history, and previous encounter notes.   Wilhemena Durie, am acting as Location manager for [employer], DO.  I have reviewed the above documentation for accuracy and completeness, and I agree with the above. Jearld Lesch, DO

## 2022-07-05 ENCOUNTER — Encounter (INDEPENDENT_AMBULATORY_CARE_PROVIDER_SITE_OTHER): Payer: Self-pay | Admitting: Bariatrics

## 2022-07-05 ENCOUNTER — Ambulatory Visit: Payer: POS | Admitting: Family Medicine

## 2022-07-05 VITALS — BP 110/82 | HR 82 | Temp 97.5°F | Wt 260.5 lb

## 2022-07-05 DIAGNOSIS — R0981 Nasal congestion: Secondary | ICD-10-CM | POA: Diagnosis not present

## 2022-07-05 DIAGNOSIS — Z20822 Contact with and (suspected) exposure to covid-19: Secondary | ICD-10-CM | POA: Diagnosis not present

## 2022-07-05 DIAGNOSIS — J069 Acute upper respiratory infection, unspecified: Secondary | ICD-10-CM | POA: Diagnosis not present

## 2022-07-05 DIAGNOSIS — R599 Enlarged lymph nodes, unspecified: Secondary | ICD-10-CM

## 2022-07-05 LAB — POC COVID19 BINAXNOW: SARS Coronavirus 2 Ag: NEGATIVE

## 2022-07-05 NOTE — Assessment & Plan Note (Signed)
In setting of congestion. Suspect reactive. Advised watch and wait. Update if new symptoms - erythema, increase size. If they do not resolve will check labs and [place]

## 2022-07-05 NOTE — Telephone Encounter (Signed)
This is a duplicate. See other encounter.

## 2022-07-05 NOTE — Patient Instructions (Signed)
Mass on neck - these seem like Reactive Lymph nodes  - likely due to infection - if they get bigger or more painful or redness - make another appointment - if improvement in 3-4 weeks - update and we can get labs and Ultrasound to evaluate - Tylenol or ibuprofen for pain   Based on your symptoms, it looks like you have a virus.   Antibiotics are not need for a viral infection but the following will help:   Drink plenty of fluids Get lots of rest  Sinus Congestion 1) Neti Pot (Saline rinse) -- 2 times day -- if tolerated 2) Flonase (Store Brand ok) - once daily 3) Over the counter congestion medications  Cough 1) Cough drops can be helpful 2) Nyquil (or nighttime cough medication) 3) Honey is proven to be one of the best cough medications  4) Cough medicine with Dextromethorphan can also be helpful  Sore Throat 1) Honey as above, cough drops 2) Ibuprofen or Aleve can be helpful 3) Salt water Gargles  If you develop fevers (Temperature >100.4), chills, worsening symptoms or symptoms lasting longer than 10 days return to clinic.

## 2022-07-05 NOTE — Progress Notes (Signed)
Subjective:     Xaria Judon is a 51 y.o. female presenting for Mass (3 bumps at base of L side of skull. Noticed over the last week ) and Nasal Congestion (In last 24 hours )     Sinus Problem This is a new problem. The current episode started yesterday. The problem has been gradually worsening since onset. There has been no fever (feeling hot but didn't check). Associated symptoms include congestion, coughing, headaches, shortness of breath (just through the nose), sinus pressure and sneezing. Pertinent negatives include no ear pain or sore throat. Treatments tried: allergy medication - flonase.    #Mass -3 painful bums at the back of the neck - x 1 week - swelling - has not tried anything for these     Review of Systems  Constitutional:  Positive for fatigue.  HENT:  Positive for congestion, sinus pressure and sneezing. Negative for ear pain and sore throat.   Respiratory:  Positive for cough and shortness of breath (just through the nose).   Neurological:  Positive for headaches.     Social History   Tobacco Use  Smoking Status Never  Smokeless Tobacco Never        Objective:    BP Readings from Last 3 Encounters:  07/05/22 110/82  06/22/22 105/72  06/08/22 116/79   Wt Readings from Last 3 Encounters:  07/05/22 260 lb 8 oz (118.2 kg)  06/22/22 253 lb (114.8 kg)  06/08/22 259 lb (117.5 kg)    BP 110/82   Pulse 82   Temp (!) 97.5 F (36.4 C) (Temporal)   Wt 260 lb 8 oz (118.2 kg)   LMP 03/23/2018   SpO2 96%   BMI 42.05 kg/m    Physical Exam Constitutional:      General: She is not in acute distress.    Appearance: She is well-developed. She is not diaphoretic.  HENT:     Head: Normocephalic and atraumatic.     Right Ear: Tympanic membrane and ear canal normal.     Left Ear: Tympanic membrane and ear canal normal.     Nose: Mucosal edema and rhinorrhea present.     Right Sinus: No maxillary sinus tenderness or frontal sinus tenderness.      Left Sinus: No maxillary sinus tenderness or frontal sinus tenderness.     Mouth/Throat:     Pharynx: Uvula midline. Posterior oropharyngeal erythema present. No oropharyngeal exudate.     Tonsils: 0 on the right. 0 on the left.  Eyes:     General: No scleral icterus.    Conjunctiva/sclera: Conjunctivae normal.  Cardiovascular:     Rate and Rhythm: Normal rate and regular rhythm.     Heart sounds: Normal heart sounds. No murmur heard. Pulmonary:     Effort: Pulmonary effort is normal. No respiratory distress.     Breath sounds: Normal breath sounds.  Musculoskeletal:     Cervical back: Neck supple.  Lymphadenopathy:     Cervical: Cervical adenopathy present.     Right cervical: No superficial, deep or posterior cervical adenopathy.    Left cervical: Posterior cervical adenopathy (firm, mobile tender lymph nodes) present. No superficial or deep cervical adenopathy.  Skin:    General: Skin is warm and dry.     Capillary Refill: Capillary refill takes less than 2 seconds.  Neurological:     Mental Status: She is alert.     Covid test negative      Assessment & Plan:   Problem List  Items Addressed This Visit       Immune and Lymphatic   Reactive lymphadenopathy    In setting of congestion. Suspect reactive. Advised watch and wait. Update if new symptoms - erythema, increase size. If they do not resolve will check labs and [place]      Relevant Orders   POC COVID-19 BinaxNow (Completed)   Other Visit Diagnoses     Viral URI    -  Primary   Relevant Orders   POC COVID-19 BinaxNow (Completed)   Nasal congestion       Relevant Orders   POC COVID-19 BinaxNow (Completed)   Suspected COVID-19 virus infection       Relevant Orders   POC COVID-19 BinaxNow (Completed)       Covid negative. Symptoms x 1 day. Try mucinex, pseudoephedrine. Update if no improvement.   Return in about 3 weeks (around 07/26/2022) for for annual exam.  Lesleigh Noe, MD

## 2022-07-06 ENCOUNTER — Encounter: Payer: Self-pay | Admitting: Bariatrics

## 2022-07-06 ENCOUNTER — Encounter (INDEPENDENT_AMBULATORY_CARE_PROVIDER_SITE_OTHER): Payer: Self-pay | Admitting: Family Medicine

## 2022-07-07 ENCOUNTER — Encounter (INDEPENDENT_AMBULATORY_CARE_PROVIDER_SITE_OTHER): Payer: Self-pay | Admitting: Family Medicine

## 2022-07-07 ENCOUNTER — Encounter: Payer: Self-pay | Admitting: Family Medicine

## 2022-07-10 NOTE — Telephone Encounter (Signed)
I spoke with pt;pt saw dark blood in constipated stool on 07/07/22; there was blood on tissue and in commode water. Pt has BM this morning that was a normal BM and no blood seen today. Pt said she is going to wait to see if any more bleeding before pt makes an appt. Pt is going to talk with weight loss clinic to see if can do different diet so not so much protein that causes pt to be constipated. Pt said she will cb if needed to [facility]. UC & ED precautions given and pt voiced understanding. Sending note to Dr Einar Pheasant.

## 2022-07-10 NOTE — Telephone Encounter (Signed)
Noted, agree with returning if worsening

## 2022-07-10 NOTE — Telephone Encounter (Signed)
Please triage patient.

## 2022-07-12 ENCOUNTER — Other Ambulatory Visit (INDEPENDENT_AMBULATORY_CARE_PROVIDER_SITE_OTHER): Payer: Self-pay | Admitting: Bariatrics

## 2022-07-12 DIAGNOSIS — E559 Vitamin D deficiency, unspecified: Secondary | ICD-10-CM

## 2022-07-20 ENCOUNTER — Encounter (INDEPENDENT_AMBULATORY_CARE_PROVIDER_SITE_OTHER): Payer: Self-pay | Admitting: Family Medicine

## 2022-07-20 ENCOUNTER — Ambulatory Visit (INDEPENDENT_AMBULATORY_CARE_PROVIDER_SITE_OTHER): Payer: POS | Admitting: Family Medicine

## 2022-07-20 VITALS — BP 115/80 | HR 73 | Temp 98.1°F | Ht 66.0 in | Wt 255.0 lb

## 2022-07-20 DIAGNOSIS — E669 Obesity, unspecified: Secondary | ICD-10-CM | POA: Diagnosis not present

## 2022-07-20 DIAGNOSIS — E559 Vitamin D deficiency, unspecified: Secondary | ICD-10-CM | POA: Diagnosis not present

## 2022-07-20 DIAGNOSIS — E639 Nutritional deficiency, unspecified: Secondary | ICD-10-CM | POA: Diagnosis not present

## 2022-07-20 DIAGNOSIS — Z6841 Body Mass Index (BMI) 40.0 and over, adult: Secondary | ICD-10-CM

## 2022-07-21 ENCOUNTER — Ambulatory Visit (INDEPENDENT_AMBULATORY_CARE_PROVIDER_SITE_OTHER): Payer: POS | Admitting: Family Medicine

## 2022-07-21 VITALS — BP 110/78 | HR 71 | Temp 96.3°F | Ht 65.75 in | Wt 257.4 lb

## 2022-07-21 DIAGNOSIS — Z23 Encounter for immunization: Secondary | ICD-10-CM

## 2022-07-21 DIAGNOSIS — R221 Localized swelling, mass and lump, neck: Secondary | ICD-10-CM

## 2022-07-21 DIAGNOSIS — Z0000 Encounter for general adult medical examination without abnormal findings: Secondary | ICD-10-CM

## 2022-07-21 DIAGNOSIS — G4733 Obstructive sleep apnea (adult) (pediatric): Secondary | ICD-10-CM

## 2022-07-21 DIAGNOSIS — G4730 Sleep apnea, unspecified: Secondary | ICD-10-CM | POA: Insufficient documentation

## 2022-07-21 NOTE — Progress Notes (Signed)
Annual Exam   Chief Complaint:  Chief Complaint  Patient presents with   Annual Exam    F/u on lymph node     History of Present Illness:  Ms. Brandi Ortiz is a 51 y.o. G1P0010 who LMP was Patient's last menstrual period was 03/23/2018., presents today for her annual examination.    07/05/2022 - had lymph nodes  - same size  - not worse or better - cold symptoms have resolved  Nutrition Diet: last month was traveling, trying to make changes Exercise: walking She does get adequate calcium and Vitamin D in her diet.   Social History   Tobacco Use  Smoking Status Never  Smokeless Tobacco Never   Social History   Substance and Sexual Activity  Alcohol Use Yes   Comment: a few times a month   Social History   Substance and Sexual Activity  Drug Use No    Safety The patient wears seatbelts: yes.     The patient feels safe at home and in their relationships: yes.  General Health Dentist in the last year: No Eye doctor: yes  Menstrual No longer perids No menopause  GYN She is not sexually active.    Cervical Cancer Screening:   Last Pap:   June 2019 Results were: no abnormalities /neg HPV DNA    Breast Cancer Screening There is no FH of breast cancer. There is no FH of ovarian cancer. BRCA screening Not Indicated.  Discussed that for average risk women between age 62-49 screening may reduce the risk of breast cancer death, however, at a lower rate than those over age 6. And that the the false-positive rates resulting in unnecessary biopsies with more screening is higher. The balance of benefits vs harms likely improves as you progress through your 40s. The patient does want a mammogram this year.   Colon Cancer Screening:  Age 76-49 yo - benefits outweigh the risk. Adults 6-85 yo who have never been screened benefit.  Benefits: 134000 people in 2016 will be diagnosed and 49,000 will die - early detection helps Harms: Complications 2/2 to  colonoscopy High Risk (Colonoscopy): genetic disorder (Lynch syndrome or familial adenomatous polyposis), personal hx of IBD, previous adenomatous polyp, or previous colorectal cancer, FamHx start 10 years before the age at diagnosis, increased in males and black race  Options:  FIT - looks for hemoglobin (blood in the stool) - specific and fairly sensitive - must be done annually Cologuard - looks for DNA and blood - more sensitive - therefore can have more false positives, every 3 years Colonoscopy - every 10 years if normal - sedation, bowl prep, must have someone drive you  Shared decision making and the patient had decided to do colonoscopy 2020.  Weight Wt Readings from Last 3 Encounters:  07/21/22 257 lb 6 oz (116.7 kg)  07/20/22 255 lb (115.7 kg)  07/05/22 260 lb 8 oz (118.2 kg)   Patient has high BMI  BMI Readings from Last 1 Encounters:  07/21/22 41.86 kg/m     Chronic disease screening Blood pressure monitoring:  BP Readings from Last 3 Encounters:  07/21/22 110/78  07/20/22 115/80  07/05/22 110/82    Lipid Monitoring: Indication for screening: age >56, obesity, diabetes, family hx, CV risk factors.  Lipid screening: Yes  Lab Results  Component Value Date   CHOL 175 07/19/2021   HDL 52.60 07/19/2021   LDLCALC 96 07/19/2021   TRIG 131.0 07/19/2021   CHOLHDL 3 07/19/2021  Diabetes Screening: age >37, overweight, family hx, PCOS, hx of gestational diabetes, at risk ethnicity Diabetes Screening screening: Not Indicated  Lab Results  Component Value Date   HGBA1C 5.1 06/08/2022     Past Medical History:  Diagnosis Date   Allergy    Chicken pox    Constipation    Fatty liver    Heart murmur    Joint pain    Vitamin D deficiency     Past Surgical History:  Procedure Laterality Date   CERVICAL BIOPSY  W/ LOOP ELECTRODE EXCISION  12/22/2011    Prior to Admission medications   Medication Sig Start Date End Date Taking? Authorizing Provider   STMHDQQIWL 798 MG/5ML SYRP Take by mouth.   Yes [provider]  famotidine (PEPCID) 20 MG tablet Take by mouth. 04/20/19  Yes [provider]  fexofenadine (ALLEGRA) 180 MG tablet Take 180 mg by mouth daily.   Yes [provider]  fluticasone (CUTIVATE) 0.05 % cream Apply topically daily as needed. 07/05/20  Yes Lesleigh Noe, MD  fluticasone (FLONASE) 50 MCG/ACT nasal spray Place into both nostrils daily.   Yes [provider]  Glucosamine Sulfate (SYNOVACIN PO) Glucosamine  1 tablet daily   Yes [provider]  Iron-Vitamin C (VITRON-C PO) Take by mouth daily.   Yes [provider]  ketoconazole (NIZORAL) 2 % cream Apply topically daily as needed for irritation. 07/05/20  Yes Lesleigh Noe, MD  meloxicam (MOBIC) 7.5 MG tablet TAKE 1 TABLET BY MOUTH EVERY DAY Patient taking differently: Take 7.5 mg by mouth as needed. 02/16/22  Yes Lesleigh Noe, MD  Turmeric 500 MG CAPS Take by mouth daily.    Yes [provider]  Semaglutide-Weight Management (WEGOVY) 0.25 MG/0.5ML SOAJ Inject 0.25 mg into the skin once a week. Patient not taking: Reported on 07/05/2022 06/22/22   Jearld Lesch A, DO    No Known Allergies  Gynecologic History: Patient's last menstrual period was 03/23/2018.  Obstetric History: G1P0010  Social History   Socioeconomic History   Marital status: Single    Spouse name: Not on file   Number of children: Not on file   Years of education: PhD   Highest education level: Not on file  Occupational History   Not on file  Tobacco Use   Smoking status: Never   Smokeless tobacco: Never  Vaping Use   Vaping Use: Never used  Substance and Sexual Activity   Alcohol use: Yes    Comment: a few times a month   Drug use: No   Sexual activity: Yes    Birth control/protection: Condom, Post-menopausal  Other Topics Concern   Not on file  Social History Narrative   07/05/20   From[place] has lived all over, moved to  [place] to be near mom   Living: living alone   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[employer], 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      Family: mom, aunts/uncles also in [place]      Enjoys: running, cooking, and eating, social gatherings       Exercise: running   Diet: intermittent fasting      Safety   Seat belts: Yes    Guns: No   Safe in relationships: Yes    '   Social Determinants of Radio broadcast assistant Strain: Not on file  Food Insecurity: Not on file  Transportation Needs: Not on file  Physical Activity: Not on file  Stress: Not on file  Social Connections: Not on file  Intimate Partner Violence: Not on file    Family History  Problem Relation Age of Onset   Diabetes Mother    Heart attack Mother 14   High blood pressure Mother    Alcohol abuse Father    Hypertension Brother    Diabetes Brother    Diabetes Maternal Grandfather    Diabetes Maternal Aunt    Diabetes Maternal Uncle    Lung cancer Maternal Uncle     Review of Systems  Constitutional:  Negative for chills and fever.  HENT:  Negative for congestion and sore throat.   Eyes:  Negative for blurred vision and double vision.  Respiratory:  Negative for shortness of breath.   Cardiovascular:  Negative for chest pain.  Gastrointestinal:  Negative for heartburn, nausea and vomiting.  Genitourinary: Negative.   Musculoskeletal: Negative.  Negative for myalgias.  Skin:  Negative for rash.  Neurological:  Negative for dizziness and headaches.  Endo/Heme/Allergies:  Does not bruise/bleed easily.  Psychiatric/Behavioral:  Negative for depression. The patient is not nervous/anxious.      Physical Exam BP 110/78   Pulse 71   Temp (!) 96.3 F (35.7 C) (Temporal)   Ht 5' 5.75" (1.67 m)   Wt 257 lb 6 oz (116.7 kg)   LMP 03/23/2018   SpO2 96%   BMI 41.86 kg/m    BP Readings from Last 3 Encounters:  07/21/22 110/78  07/20/22 115/80  07/05/22 110/82      Physical  Exam Constitutional:      General: She is not in acute distress.    Appearance: She is well-developed. She is not diaphoretic.  HENT:     Head: Normocephalic and atraumatic.     Right Ear: External ear normal.     Left Ear: External ear normal.     Nose: Nose normal.  Eyes:     General: No scleral icterus.    Extraocular Movements: Extraocular movements intact.     Conjunctiva/sclera: Conjunctivae normal.  Neck:   Cardiovascular:     Rate and Rhythm: Normal rate and regular rhythm.     Heart sounds: No murmur heard. Pulmonary:     Effort: Pulmonary effort is normal. No respiratory distress.     Breath sounds: Normal breath sounds. No wheezing.  Abdominal:     General: Bowel sounds are normal. There is no distension.     Palpations: Abdomen is soft. There is no mass.     Tenderness: There is no abdominal tenderness. There is no guarding or rebound.  Musculoskeletal:        General: Normal range of motion.     Cervical back: Neck supple.  Lymphadenopathy:     Cervical: Cervical adenopathy present.     Left cervical: Posterior cervical adenopathy present.  Skin:    General: Skin is warm and dry.     Capillary Refill: Capillary refill takes less than 2 seconds.  Neurological:     Mental Status: She is alert and oriented to person, place, and time.     Deep Tendon Reflexes: Reflexes normal.  Psychiatric:        Mood and Affect: Mood normal.        Behavior: Behavior normal.      Results:  PHQ-9:  [place] Office Visit from 06/08/2022 in [place]  PHQ-9 Total Score 10         Assessment: 51 y.o. G62P0010 female here for  routine annual physical examination.  Plan: Problem List Items Addressed This Visit       Respiratory   Sleep apnea     Other   Mass in neck    Occiput area without clear cause. No clear borders. Suspect other area lymph node which has persisted. [place] to evaluate      Relevant Orders   US SOFT TISSUE HEAD & NECK  (NON-THYROID)   Other Visit Diagnoses     Annual physical exam    -  Primary   Relevant Orders   Flu Vaccine QUAD 57moIM (Fluarix, Fluzone & Alfiuria Quad PF) (Completed)   Tdap vaccine greater than or equal to 7yo IM (Completed)       Screening: -- Blood pressure screen normal -- cholesterol screening: not due for screening -- Weight screening: obese: discussed management options, including lifestyle, dietary, and exercise. -- Diabetes Screening: not due for screening -- Nutrition: Encouraged healthy diet  The 10-year ASCVD risk score (Arnett DK, et al., 2019) is: 0.9%   Values used to calculate the score:     Age: [age]years     Sex: Female     Is Non-Hispanic African American: Yes     Diabetic: No     Tobacco smoker: No     Systolic Blood Pressure: 1559mmHg     Is BP treated: No     HDL Cholesterol: 52.6 mg/dL     Total Cholesterol: 175 mg/dL  -- Statin therapy for Age 742-75with CVD risk >7.5%  Psych -- Depression screening (PHQ-9):  [place] Total Score 10        Safety -- tobacco screening: not using -- alcohol screening:  low-risk usage. -- no evidence of domestic violence or intimate partner violence.   Cancer Screening -- pap smear not collected per ASCCP guidelines -- family history of breast cancer screening: done. not at high risk. -- Mammogram -  up to date -- Colon cancer (age 49+-- requested  Immunizations Immunization History  Administered Date(s) Administered   Influenza Inj Mdck Quad With Preservative 08/27/2018   Influenza,inj,Quad PF,6+ Mos 07/05/2020, 07/19/2021, 07/21/2022   Influenza,inj,quad, With Preservative 07/30/2019   Influenza-Unspecified 08/06/2018   Moderna Covid-19 Vaccine Bivalent Booster 163yr& up 02/03/2022   PFIZER(Purple Top)SARS-COV-2 Vaccination 01/08/2020, 02/03/2020, 10/29/2020   Tdap 07/21/2022   Typhoid Live 08/27/2018    -- flu  vaccine up to date -- TDAP q10 years up to date - -- Covid-19 Vaccine up to date   Encouraged healthy diet and exercise. Encouraged regular vision and dental care.   JeLesleigh NoeMD

## 2022-07-21 NOTE — Patient Instructions (Addendum)
Ultrasound of the neck is ordered

## 2022-07-21 NOTE — Assessment & Plan Note (Signed)
Occiput area without clear cause. No clear borders. Suspect other area lymph node which has persisted. [place] to evaluate

## 2022-07-24 ENCOUNTER — Ambulatory Visit
Admission: RE | Admit: 2022-07-24 | Discharge: 2022-07-24 | Disposition: A | Discharge status: Home / Self Care | Payer: POS | Source: Ambulatory Visit | Attending: Family Medicine | Admitting: Family Medicine

## 2022-07-24 DIAGNOSIS — R221 Localized swelling, mass and lump, neck: Secondary | ICD-10-CM

## 2022-07-26 ENCOUNTER — Encounter: Payer: Self-pay | Admitting: Family Medicine

## 2022-07-27 NOTE — Progress Notes (Signed)
Chief Complaint:   OBESITY Brandi Ortiz is here to discuss her progress with her obesity treatment plan along with follow-up of her obesity related diagnoses. Brandi Ortiz is on the Category 3 Plan and keeping a food journal and adhering to recommended goals of 1500 calories and 90 grams protein and states she is following her eating plan approximately 50% of the time. Brandi Ortiz states she is walking 30 minutes 3 times per week.  Today's visit was #: 3 Starting weight: 259 lbs Starting date: 06/08/2022 Today's weight: 255 lbs Today's date: 07/20/2022 Total lbs lost to date: 4 Total lbs lost since last in-office visit: +2  Interim History: This is Brandi Ortiz's first OV with me. She was previously seen by Dr. Owens Shark, who prescribed [phone], but pt has not been able to locate it due to shortage. Pt hasn't been following category 3 or journaling this past month.  Subjective:   1. Vitamin D deficiency Dr. Owens Shark started pt on Ergocalciferol at last OV.  2. Poor nutrition Pt has been unable to follow meal plan this past month. She loves to cook but is unable to journal. She used phentermine in the past and desires this again because [facility] [place] is not covered. Pt is convinced a weight loss medication is needed.  Assessment/Plan:  No orders of the defined types were placed in this encounter.   There are no discontinued medications.   No orders of the defined types were placed in this encounter.    1. Vitamin D deficiency - I again reiterated the importance of vitamin D (as well as calcium) to their health and wellbeing.  - I reviewed possible symptoms of low Vitamin D:  low energy, depressed mood, muscle aches, joint aches, osteoporosis etc. - low Vitamin D levels may be linked to an increased risk of cardiovascular events and even increased risk of cancers- such as colon and breast.  - ideal vitamin D levels reviewed with patient  - I recommend pt take a 50,000 IU weekly prescription vit D - see  script below   - Informed patient this may be a lifelong thing, and she was encouraged to continue to take the medicine until told otherwise.    - weight loss will likely improve availability of vitamin D, thus encouraged Brandi Ortiz to continue with meal plan and their weight loss efforts to further improve this condition.  Thus, we will need to monitor levels regularly (every 3-4 mo on average) to keep levels within normal limits and prevent over supplementation. - pt's questions and concerns regarding this condition addressed.  2. Poor nutrition Very long discussion with pt regarding the difficulty to ascertain what type of weight loss medication she needs because she is not following the plan at all yet. I recommend she follow the plan best she can until next OV and then we can discuss symptoms/concerns she is having.  3. Obesity, Current BMI 41.2 Brandi Ortiz is currently in the action stage of change. As such, her goal is to continue with weight loss efforts. She has agreed to the Category 3 Plan and keeping a food journal and adhering to recommended goals of 1500 calories and 90 grams protein.   Exercise goals:  As is  Behavioral modification strategies: no skipping meals, meal planning and cooking strategies, and planning for success.  Brandi Ortiz has agreed to follow-up with our clinic in 2 weeks. She was informed of the importance of frequent follow-up visits to maximize her success with intensive lifestyle modifications for her multiple health  conditions.   Objective:   Blood pressure 115/80, pulse 73, temperature 98.1 F (36.7 C), height '5\' 6"'$  (1.676 m), weight 255 lb (115.7 kg), last menstrual period 03/23/2018, SpO2 98 %. Body mass index is 41.16 kg/m.  General: Cooperative, alert, well developed, in no acute distress. HEENT: Conjunctivae and lids unremarkable. Cardiovascular: Regular rhythm.  Lungs: Normal work of breathing. Neurologic: No focal deficits.   Lab Results  Component  Value Date   CREATININE 1.03 (H) 06/08/2022   BUN 13 06/08/2022   NA 139 06/08/2022   K 4.6 06/08/2022   CL 99 06/08/2022   CO2 22 06/08/2022   Lab Results  Component Value Date   ALT 65 (H) 06/08/2022   AST 47 (H) 06/08/2022   ALKPHOS 160 (H) 06/08/2022   BILITOT 0.7 06/08/2022   Lab Results  Component Value Date   HGBA1C 5.1 06/08/2022   HGBA1C 4.8 07/19/2021   HGBA1C 4.5 (L) 04/15/2018   Lab Results  Component Value Date   INSULIN 39.3 (H) 06/08/2022   Lab Results  Component Value Date   TSH 1.550 06/08/2022   Lab Results  Component Value Date   CHOL 175 07/19/2021   HDL 52.60 07/19/2021   LDLCALC 96 07/19/2021   TRIG 131.0 07/19/2021   CHOLHDL 3 07/19/2021   Lab Results  Component Value Date   VD25OH 27.8 (L) 06/08/2022   VD25OH 29.84 (L) 07/19/2021   VD25OH 28.66 (L) 07/05/2020   Lab Results  Component Value Date   WBC 5.8 07/19/2021   HGB 13.5 07/19/2021   HCT 41.9 07/19/2021   MCV 85.0 07/19/2021   PLT 200.0 07/19/2021   Lab Results  Component Value Date   FERRITIN 141.7 07/05/2020    Attestation Statements:   Reviewed by clinician on day of visit: allergies, medications, problem list, medical history, surgical history, family history, social history, and previous encounter notes.  Time spent on visit including pre-visit chart review and post-visit care and charting was 30 minutes.   I, Kathlene November, BS, CMA, am acting as transcriptionist for [employer], DO.   I have reviewed the above documentation for accuracy and completeness, and I agree with the above. Marjory Sneddon, D.O.  The [place] was signed into law in 2016 which includes the topic of electronic health records.  This provides immediate access to information in MyChart.  This includes consultation notes, operative notes, office notes, lab results and pathology reports.  If you have any questions about what you read please let us know at your next visit so we  can discuss your concerns and take corrective action if need be.  We are right here with you.

## 2022-08-02 ENCOUNTER — Ambulatory Visit (HOSPITAL_BASED_OUTPATIENT_CLINIC_OR_DEPARTMENT_OTHER): Payer: POS | Attending: Primary Care | Admitting: Pulmonary Disease

## 2022-08-02 DIAGNOSIS — G4736 Sleep related hypoventilation in conditions classified elsewhere: Secondary | ICD-10-CM | POA: Insufficient documentation

## 2022-08-02 DIAGNOSIS — G4733 Obstructive sleep apnea (adult) (pediatric): Secondary | ICD-10-CM | POA: Diagnosis not present

## 2022-08-02 DIAGNOSIS — G4730 Sleep apnea, unspecified: Secondary | ICD-10-CM | POA: Diagnosis present

## 2022-08-03 ENCOUNTER — Ambulatory Visit (INDEPENDENT_AMBULATORY_CARE_PROVIDER_SITE_OTHER): Payer: POS | Admitting: Family Medicine

## 2022-08-03 VITALS — BP 125/84 | HR 77 | Temp 97.8°F | Ht 64.0 in | Wt 250.0 lb

## 2022-08-03 DIAGNOSIS — R632 Polyphagia: Secondary | ICD-10-CM

## 2022-08-03 DIAGNOSIS — M25562 Pain in left knee: Secondary | ICD-10-CM | POA: Diagnosis not present

## 2022-08-03 DIAGNOSIS — E559 Vitamin D deficiency, unspecified: Secondary | ICD-10-CM | POA: Diagnosis not present

## 2022-08-03 DIAGNOSIS — E669 Obesity, unspecified: Secondary | ICD-10-CM

## 2022-08-03 DIAGNOSIS — E66813 Obesity, class 3: Secondary | ICD-10-CM

## 2022-08-03 DIAGNOSIS — G4733 Obstructive sleep apnea (adult) (pediatric): Secondary | ICD-10-CM | POA: Diagnosis not present

## 2022-08-03 DIAGNOSIS — Z6841 Body Mass Index (BMI) 40.0 and over, adult: Secondary | ICD-10-CM

## 2022-08-03 MED ORDER — VITAMIN D (ERGOCALCIFEROL) 1.25 MG (50000 UNIT) PO CAPS: 50000.0000 [IU] | ORAL_CAPSULE | ORAL | 0 refills | Status: DC

## 2022-08-10 DIAGNOSIS — G4730 Sleep apnea, unspecified: Secondary | ICD-10-CM | POA: Diagnosis not present

## 2022-08-10 NOTE — Procedures (Signed)
Patient Name: Brandi Ortiz, Brandi Ortiz Date: 08/02/2022 Gender: Female D.O.B: 04-03-1971 Age (years): 61 Referring Provider: Geraldo Pitter NP Height (inches): 64 Interpreting Physician: Kara Mead MD, ABSM Weight (lbs): 254 RPSGT: Jorge Ny BMI: 44 MRN: [id number] Neck Size: 16.25 <br> <br> CLINICAL INFORMATION The patient is referred for a CPAP titration to treat sleep apnea.    HST 04/11/22 >> AHI 79.5/hour with SpO2 low 66%  SLEEP STUDY TECHNIQUE As per the AASM Manual for the Scoring of Sleep and Associated Events v2.3 (April 2016) with a hypopnea requiring 4% desaturations.  The channels recorded and monitored were frontal, central and occipital EEG, electrooculogram (EOG), submentalis EMG (chin), nasal and oral airflow, thoracic and abdominal wall motion, anterior tibialis EMG, snore microphone, electrocardiogram, and pulse oximetry. Continuous positive airway pressure (CPAP) was initiated at the beginning of the study and titrated to treat sleep-disordered breathing.  MEDICATIONS Medications self-administered by patient taken the night of the study : N/A  RESPIRATORY PARAMETERS Optimal PAP Pressure (cm): 13 AHI at Optimal Pressure (/hr): 1.7 Overall Minimal O2 (%): 70.0 Supine % at Optimal Pressure (%): 0 Minimal O2 at Optimal Pressure (%): 89   SLEEP ARCHITECTURE The study was initiated at 10:27:47 PM and ended at 5:17:41 AM.  Sleep onset time was 11.1 minutes and the sleep efficiency was 88.3%%. The total sleep time was 362 minutes.  The patient spent 2.2%% of the night in stage N1 sleep, 77.5%% in stage N2 sleep, 0.0%% in stage N3 and 20.3% in REM.Stage REM latency was 75.5 minutes  Wake after sleep onset was 36.8. Alpha intrusion was absent. Supine sleep was 19.06%.  CARDIAC DATA The 2 lead EKG demonstrated sinus rhythm. The mean heart rate was 66.9 beats per minute. Other EKG findings include: None.   LEG MOVEMENT DATA The total Periodic Limb  Movements of Sleep (PLMS) were 0. The PLMS index was 0.0. A PLMS index of <15 is considered normal in adults.  IMPRESSIONS - An optimal PAP pressure of 13-14 cm was  selected for this patient based on the available study data. - Severe REM related oxygen desaturations were observed during this titration (min O2 = 70.0%). - The patient snored with soft snoring volume during this titration study. - No cardiac abnormalities were observed during this study. - Clinically significant periodic limb movements were not noted during this study. Arousals associated with PLMs were rare.   DIAGNOSIS - Obstructive Sleep Apnea (G47.33) - Nocturnal Hypoxemia (G47.36)   RECOMMENDATIONS - Recommend a trial of CPAP 14 cm with small full face mask - Avoid alcohol, sedatives and other CNS depressants that may worsen sleep apnea and disrupt normal sleep architecture. - Sleep hygiene should be reviewed to assess factors that may improve sleep quality. - Weight management and regular exercise should be initiated or continued. - Return to [facility] for re-evaluation after 4 weeks of therapy   Kara Mead MD Board Certified in [place]

## 2022-08-10 NOTE — Progress Notes (Signed)
Chief Complaint:   OBESITY Brandi Ortiz is here to discuss her progress with her obesity treatment plan along with follow-up of her obesity related diagnoses. Brandi Ortiz is on the Category 3 Plan and keeping a food journal and adhering to recommended goals of 1500 calories and 90 protein and states she is following her eating plan approximately 50% of the time. Brandi Ortiz states she is walking and weight lifting 30 minutes 30 times per week.  Today's visit was #: 4 Starting weight: 259 lbs Starting date: 06/08/2022 Today's weight: 250 lbs Today's date: 08/03/2022 Total lbs lost to date: 9 lbs Total lbs lost since last in-office visit: 5 lbs  Interim History: Unable to start Wegovy due to national drug shortage.  Would like more options.  She eats [employer] Delightful sandwich for breakfast, greek yogurt, fairlife milk.  She is tired of meal plan lunch and dinner options.  Cooks dinner for her family, rarely eats out.  Works from home.  Will travel to  [place] in November.   Subjective:   1. Left knee pain, unspecified chronicity She is able to do some exercise without much pain.  Had a xray 01/19/22 showing minimal spurring of lateral patellofemoral joint space.   2. Severe, OSA (obstructive sleep apnea) Starting on CPAP soon.  Slept better on CPAP trial.   3. Polyphagia Reviewed food choices, eating schedule, snacks and answered nutrition questions. Hunger has been fairly well managed.   4. Vitamin D deficiency She is currently taking prescription vitamin D 50,000 IU each week. She denies nausea, vomiting or muscle weakness.  Assessment/Plan:   1. Left knee pain, unspecified chronicity Exercise as tolerated, ice left knee for 10 minutes post exercise, as needed.   2. Severe, OSA (obstructive sleep apnea) Begin CPAP and try to get 7-8 hours of sleep.  Look for improvements in energy levels by next visit.   3. Polyphagia Consider Qsymia or Wegovy once available.   4.  Vitamin D deficiency Low Vitamin D level contributes to fatigue and are associated with obesity, breast, and colon cancer. She agrees to continue to take prescription Vitamin D '@50'$ ,000 IU every week and will follow-up for routine testing of Vitamin D, at least 2-3 times per year to avoid over-replacement.  Refill - Vitamin D, Ergocalciferol, (DRISDOL) 1.25 MG (50000 UNIT) CAPS capsule; Take 1 capsule (50,000 Units total) by mouth every 7 (seven) days.  Dispense: 5 capsule; Refill: 0  5. Obesity,current BMI  1) Additional lunch and breakfast options handouts given.   2) Reviewed the calorie king web site.   Brandi Ortiz is currently in the action stage of change. As such, her goal is to continue with weight loss efforts. She has agreed to the Category 3 Plan+90 protein daily. 500-600 calorie lunch, 600-700 calories for dinner.   Exercise goals:  As is.   Behavioral modification strategies: increasing lean protein intake, increasing vegetables, increasing water intake, decreasing eating out, no skipping meals, meal planning and cooking strategies, and decreasing junk food.  Brandi Ortiz has agreed to follow-up with our clinic in 3 weeks. She was informed of the importance of frequent follow-up visits to maximize her success with intensive lifestyle modifications for her multiple health conditions.   Objective:   Blood pressure 125/84, pulse 77, temperature 97.8 F (36.6 C), height '5\' 4"'$  (1.626 m), weight 250 lb (113.4 kg), last menstrual period 03/23/2018, SpO2 97 %. Body mass index is 42.91 kg/m.  General: Cooperative, alert, well developed, in no acute distress. HEENT: Conjunctivae  and lids unremarkable. Cardiovascular: Regular rhythm.  Lungs: Normal work of breathing. Neurologic: No focal deficits.   Lab Results  Component Value Date   CREATININE 1.03 (H) 06/08/2022   BUN 13 06/08/2022   NA 139 06/08/2022   K 4.6 06/08/2022   CL 99 06/08/2022   CO2 22 06/08/2022   Lab Results   Component Value Date   ALT 65 (H) 06/08/2022   AST 47 (H) 06/08/2022   ALKPHOS 160 (H) 06/08/2022   BILITOT 0.7 06/08/2022   Lab Results  Component Value Date   HGBA1C 5.1 06/08/2022   HGBA1C 4.8 07/19/2021   HGBA1C 4.5 (L) 04/15/2018   Lab Results  Component Value Date   INSULIN 39.3 (H) 06/08/2022   Lab Results  Component Value Date   TSH 1.550 06/08/2022   Lab Results  Component Value Date   CHOL 175 07/19/2021   HDL 52.60 07/19/2021   LDLCALC 96 07/19/2021   TRIG 131.0 07/19/2021   CHOLHDL 3 07/19/2021   Lab Results  Component Value Date   VD25OH 27.8 (L) 06/08/2022   VD25OH 29.84 (L) 07/19/2021   VD25OH 28.66 (L) 07/05/2020   Lab Results  Component Value Date   WBC 5.8 07/19/2021   HGB 13.5 07/19/2021   HCT 41.9 07/19/2021   MCV 85.0 07/19/2021   PLT 200.0 07/19/2021   Lab Results  Component Value Date   FERRITIN 141.7 07/05/2020    Attestation Statements:   Reviewed by clinician on day of visit: allergies, medications, problem list, medical history, surgical history, family history, social history, and previous encounter notes.  Time spent on visit including pre-visit chart review and post-visit care and charting was 40 minutes.   I, Davy Pique, am acting as Location manager for Loyal Gambler, DO.  I have reviewed the above documentation for accuracy and completeness, and I agree with the above. Dell Ponto, DO

## 2022-08-15 NOTE — Progress Notes (Signed)
HST 04/11/22 >> AHI 79.5/hour with SpO2 low 66%  Symptoms snoring  Start CPAP 14cm h20 with small full face mask  Needs follow-up 31-90 days after starting CPAP for compliance check

## 2022-08-15 NOTE — Telephone Encounter (Signed)
Beth, please advise on pt's CPAP titration results from 08/02/2022. Thanks.

## 2022-08-15 NOTE — Telephone Encounter (Signed)
Patient had patient did well on pressure settings 13 and 14 cm H2O.  Place an order for patient to receive new CPAP machine at her setting of 14 cm with small full face mask  Oxygen needed

## 2022-08-16 ENCOUNTER — Other Ambulatory Visit: Payer: Self-pay

## 2022-08-16 DIAGNOSIS — R0683 Snoring: Secondary | ICD-10-CM

## 2022-08-16 DIAGNOSIS — G4730 Sleep apnea, unspecified: Secondary | ICD-10-CM

## 2022-08-17 ENCOUNTER — Encounter (INDEPENDENT_AMBULATORY_CARE_PROVIDER_SITE_OTHER): Payer: Self-pay | Admitting: Internal Medicine

## 2022-08-17 ENCOUNTER — Ambulatory Visit (INDEPENDENT_AMBULATORY_CARE_PROVIDER_SITE_OTHER): Payer: POS | Admitting: Internal Medicine

## 2022-08-17 VITALS — BP 125/84 | HR 71 | Temp 98.9°F | Ht 64.0 in | Wt 249.6 lb

## 2022-08-17 DIAGNOSIS — R632 Polyphagia: Secondary | ICD-10-CM | POA: Diagnosis not present

## 2022-08-17 DIAGNOSIS — E669 Obesity, unspecified: Secondary | ICD-10-CM

## 2022-08-17 DIAGNOSIS — Z6841 Body Mass Index (BMI) 40.0 and over, adult: Secondary | ICD-10-CM | POA: Diagnosis not present

## 2022-08-17 DIAGNOSIS — E66812 Obesity, class 2: Secondary | ICD-10-CM | POA: Insufficient documentation

## 2022-08-18 NOTE — Telephone Encounter (Signed)
Brandi Ortiz, pt states she does not wear oxygen. Please advise/confirm you would like her on 2lpm at night to bleed into CPAP. Thanks.

## 2022-08-18 NOTE — Telephone Encounter (Signed)
I think I meant to write no oxygen needed. She did have oxygen desaturations during REM but on optimal PAP pressure SpO2 low was 89%. No oxygen needed right now. We will check ONO on CPAP at follow-up

## 2022-08-30 NOTE — Progress Notes (Signed)
Chief Complaint:   OBESITY Brandi Ortiz is here to discuss her progress with her obesity treatment plan along with follow-up of her obesity related diagnoses. Brandi Ortiz is on the Category 3 Plan with 500-600 calories at lunch and 600-700 calories at dinner and states she is following her eating plan approximately 70% of the time. Brandi Ortiz states she is doing cardio and weights for 30-40 minutes 1 time per week.  Today's visit was #: 5 Starting weight: 259 lbs Starting date: 06/08/2022 Today's weight: 249 lbs Today's date: 08/17/2022 Total lbs lost to date: 10 Total lbs lost since last in-office visit: 1  Interim History: Brandi Ortiz is working remotely, busy in the evenings, and sometimes does not eat dinner.  She has difficulties with satiety and satiation.  She occasionally will snack on two apples and 2 yogurts.  She is doing a good job Engineering geologist for lunch.  She is eating [employer] delightful breakfast.  Subjective:   1. Polyphagia Winnell was counseled on the difference between physiological versus psychological hunger and mitigation strategies.  Handout was also provided.  Assessment/Plan:   1. Polyphagia Brandi Ortiz will ensure 3 meals a day with adequate protein and complex carbohydrates to reduce signaling.  Behavioral approaches were also discussed.  2. Obesity with current BMI 42.8 Brandi Ortiz is currently in the action stage of change. As such, her goal is to continue with weight loss efforts. She has agreed to the Category 3 Plan.   Introduce [email or web address] for dinner recipes to meal prep for several evenings at a time.  Exercise goals: As is.   Behavioral modification strategies: decreasing liquid calories, no skipping meals, meal planning and cooking strategies, emotional eating strategies, avoiding temptations, and planning for success.  Brandi Ortiz has agreed to follow-up with our clinic in 4 weeks. She was informed of the importance of frequent follow-up visits to  maximize her success with intensive lifestyle modifications for her multiple health conditions.   Objective:   Blood pressure 125/84, pulse 71, temperature 98.9 F (37.2 C), height '5\' 4"'$  (1.626 m), weight 249 lb 9.6 oz (113.2 kg), last menstrual period 03/23/2018, SpO2 96 %. Body mass index is 42.84 kg/m.  General: Cooperative, alert, well developed, in no acute distress. HEENT: Conjunctivae and lids unremarkable. Cardiovascular: Regular rhythm.  Lungs: Normal work of breathing. Neurologic: No focal deficits.   Lab Results  Component Value Date   CREATININE 1.03 (H) 06/08/2022   BUN 13 06/08/2022   NA 139 06/08/2022   K 4.6 06/08/2022   CL 99 06/08/2022   CO2 22 06/08/2022   Lab Results  Component Value Date   ALT 65 (H) 06/08/2022   AST 47 (H) 06/08/2022   ALKPHOS 160 (H) 06/08/2022   BILITOT 0.7 06/08/2022   Lab Results  Component Value Date   HGBA1C 5.1 06/08/2022   HGBA1C 4.8 07/19/2021   HGBA1C 4.5 (L) 04/15/2018   Lab Results  Component Value Date   INSULIN 39.3 (H) 06/08/2022   Lab Results  Component Value Date   TSH 1.550 06/08/2022   Lab Results  Component Value Date   CHOL 175 07/19/2021   HDL 52.60 07/19/2021   LDLCALC 96 07/19/2021   TRIG 131.0 07/19/2021   CHOLHDL 3 07/19/2021   Lab Results  Component Value Date   VD25OH 27.8 (L) 06/08/2022   VD25OH 29.84 (L) 07/19/2021   VD25OH 28.66 (L) 07/05/2020   Lab Results  Component Value Date   WBC 5.8 07/19/2021   HGB 13.5 07/19/2021   HCT  41.9 07/19/2021   MCV 85.0 07/19/2021   PLT 200.0 07/19/2021   Lab Results  Component Value Date   FERRITIN 141.7 07/05/2020   Attestation Statements:   Reviewed by clinician on day of visit: allergies, medications, problem list, medical history, surgical history, family history, social history, and previous encounter notes.  Time spent on visit including pre-visit chart review and post-visit care and charting was 20 minutes.   Wilhemena Durie, am  acting as transcriptionist for Thomes Dinning, MD.  I have reviewed the above documentation for accuracy and completeness, and I agree with the above. -Thomes Dinning, MD

## 2022-09-18 ENCOUNTER — Ambulatory Visit: Payer: POS | Admitting: Family Medicine

## 2022-09-18 ENCOUNTER — Ambulatory Visit (INDEPENDENT_AMBULATORY_CARE_PROVIDER_SITE_OTHER): Payer: POS | Admitting: Internal Medicine

## 2022-09-18 ENCOUNTER — Encounter: Payer: Self-pay | Admitting: Family Medicine

## 2022-09-18 ENCOUNTER — Encounter: Payer: POS | Admitting: Family

## 2022-09-18 VITALS — BP 110/70 | HR 73 | Temp 98.6°F | Ht 64.0 in | Wt 258.4 lb

## 2022-09-18 DIAGNOSIS — U071 COVID-19: Secondary | ICD-10-CM | POA: Diagnosis not present

## 2022-09-18 DIAGNOSIS — R0981 Nasal congestion: Secondary | ICD-10-CM

## 2022-09-18 LAB — POC COVID19 BINAXNOW: SARS Coronavirus 2 Ag: POSITIVE — AB

## 2022-09-18 MED ORDER — NIRMATRELVIR/RITONAVIR (PAXLOVID)TABLET
3.0000 | ORAL_TABLET | Freq: Two times a day (BID) | ORAL | 0 refills | Status: AC
Start: 2022-09-18 — End: 2022-09-23

## 2022-09-18 NOTE — Progress Notes (Unsigned)
    Brandi Geng T. Dinero Chavira, MD, [place] at [facility] [place], [address]  Phone: [phone]  FAX: [phone]  Brandi Ortiz - 51 y.o. female  MRN [id number]  Date of Birth: 12-Apr-1971  Date: 09/18/2022  PCP: Waunita Schooner, MD  Referral: Waunita Schooner, MD  Chief Complaint  Patient presents with   Nasal Congestion    X 3 days No Covid Test   Cough   Sinus Drainage   Sore Throat    From drainage   Subjective:   Brandi Ortiz is a 51 y.o. very pleasant female patient with Body mass index is 44.35 kg/m. who presents with the following:  Having a lot of congestion and runny nose right now.  She has been sick for about 3 days.  Having a lot of cough, sinus congestion, and sore throat.   Sneezing more than she coughs.  Recently had bronchitis, and she never fully got rid of it.   No fever  Eating and drinking ok.   Noticed really congested.    Popping on her L > R ear, some pain.   Review of Systems is noted in the HPI, as appropriate  Objective:   BP 110/70   Pulse 73   Temp 98.6 F (37 C) (Oral)   Ht '5\' 4"'$  (1.626 m)   Wt 258 lb 6 oz (117.2 kg)   LMP 03/23/2018   SpO2 99%   BMI 44.35 kg/m   GEN: No acute distress; alert,appropriate. PULM: Breathing comfortably in no respiratory distress PSYCH: Normally interactive.   Laboratory and Imaging Data:  Assessment and Plan:   ***

## 2022-09-19 ENCOUNTER — Encounter: Payer: Self-pay | Admitting: Family Medicine

## 2022-09-19 ENCOUNTER — Ambulatory Visit (INDEPENDENT_AMBULATORY_CARE_PROVIDER_SITE_OTHER): Payer: POS | Admitting: Internal Medicine

## 2022-09-22 ENCOUNTER — Ambulatory Visit
Admission: EM | Admit: 2022-09-22 | Discharge: 2022-09-22 | Disposition: A | Discharge status: Home / Self Care | Payer: POS

## 2022-09-22 ENCOUNTER — Encounter: Payer: Self-pay | Admitting: Family Medicine

## 2022-09-22 DIAGNOSIS — H9201 Otalgia, right ear: Secondary | ICD-10-CM | POA: Diagnosis not present

## 2022-09-22 DIAGNOSIS — U071 COVID-19: Secondary | ICD-10-CM

## 2022-09-22 NOTE — ED Provider Notes (Signed)
Rod[facility] Palau    CSN: [id number] Arrival date & time: 09/22/22  1601      History   Chief Complaint Chief Complaint  Patient presents with   Otalgia    HPI Brandi Ortiz is a 51 y.o. female.  Patient presents with right ear pain today which started when she blew her nose.  Her right ear popped and she had a moment of dizziness which has resolved.  The ear pain has resolved also.  No ear drainage.  She denies fever, chills, ear drainage, cough, shortness of breath, vomiting, diarrhea, or other symptoms.  Patient was diagnosed with COVID on 09/18/2022 and is taking Paxlovid.   The history is provided by the patient and medical records.    Past Medical History:  Diagnosis Date   Allergy    Chicken pox    Constipation    Fatty liver    Heart murmur    Joint pain    Vitamin D deficiency     Patient Active Problem List   Diagnosis Date Noted   Obesity with current BMI 42.8 08/17/2022   Left knee pain 08/03/2022   OSA (obstructive sleep apnea) 08/03/2022   Sleep apnea 07/21/2022   Mass in neck 07/21/2022   Poor nutrition 07/20/2022   Reactive lymphadenopathy 07/05/2022   Polyphagia 06/22/2022   Elevated liver enzymes 06/12/2022   Insulin resistance 06/12/2022   Other fatigue 06/08/2022   SOB (shortness of breath) on exertion 06/08/2022   Health care maintenance 06/08/2022   Elevated glucose 06/08/2022   Class 3 severe obesity with serious comorbidity and body mass index (BMI) of 40.0 to 44.9 in adult [facility]) 06/08/2022   Urinary retention 04/10/2022   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   Psoriasis 02/01/2022   Loud snoring 02/01/2022   Plantar fasciitis 06/01/2021   Recurrent streptococcal tonsillitis 09/21/2020   Arthritis 07/05/2020   Eczema 07/05/2020   Candidal dermatitis 07/05/2020   Exertional headache 07/05/2020   Calculus of gallbladder without cholecystitis without obstruction 02/13/2019   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   NAFLD  (nonalcoholic fatty liver disease) 02/13/2019   Cardiac murmur 12/22/2018   Elevated LFTs 12/22/2018   Gastroesophageal reflux disease 12/22/2018   Vitamin D deficiency 04/17/2018   Uterine fibroid 04/15/2018   Allergic rhinitis 03/22/2017   Alopecia 03/22/2017    Past Surgical History:  Procedure Laterality Date   CERVICAL BIOPSY  W/ LOOP ELECTRODE EXCISION  12/22/2011    OB History     Gravida  1   Para      Term      Preterm      AB  1   Living         SAB  1   IAB      Ectopic      Multiple      Live Births               Home Medications    Prior to Admission medications   Medication Sig Start Date End Date Taking? Authorizing Provider  [id number] 829 MG/5ML SYRP Take by mouth.    [provider]  famotidine (PEPCID) 20 MG tablet Take by mouth. 04/20/19   [provider]  fexofenadine (ALLEGRA) 180 MG tablet Take 180 mg by mouth daily.    [provider]  fluticasone (CUTIVATE) 0.05 % cream Apply topically daily as needed. 07/05/20   Waunita Schooner, MD  fluticasone (FLONASE) 50 MCG/ACT nasal spray Place into both nostrils daily.  [provider]  Glucosamine Sulfate (SYNOVACIN PO) Glucosamine  1 tablet daily    [provider]  Iron-Vitamin C (VITRON-C PO) Take by mouth daily.    [provider]  ketoconazole (NIZORAL) 2 % cream Apply topically daily as needed for irritation. 07/05/20   Waunita Schooner, MD  meloxicam (MOBIC) 7.5 MG tablet TAKE 1 TABLET BY MOUTH EVERY DAY Patient taking differently: Take 7.5 mg by mouth as needed. 02/16/22   Waunita Schooner, MD  nirmatrelvir/ritonavir EUA (PAXLOVID) 20 x 150 MG & 10 x '100MG'$  TABS Take 3 tablets by mouth 2 (two) times daily for 5 days. (Take nirmatrelvir 150 mg two tablets twice daily for 5 days and ritonavir 100 mg one tablet twice daily for 5 days) Patient GFR is 67 09/18/22 09/23/22  [facility], Frederico Hamman, MD  Turmeric 500 MG CAPS Take by mouth daily.      [provider]    Family History Family History  Problem Relation Age of Onset   Diabetes Mother    Heart attack Mother 30   High blood pressure Mother    Alcohol abuse Father    Hypertension Brother    Diabetes Brother    Diabetes Maternal Grandfather    Diabetes Maternal Aunt    Diabetes Maternal Uncle    Lung cancer Maternal Uncle     Social History Social History   Tobacco Use   Smoking status: Never   Smokeless tobacco: Never  Vaping Use   Vaping Use: Never used  Substance Use Topics   Alcohol use: Yes    Comment: a few times a month   Drug use: No     Allergies   Patient has no known allergies.   Review of Systems Review of Systems  Constitutional:  Negative for chills and fever.  HENT:  Positive for ear pain. Negative for ear discharge and sore throat.   Respiratory:  Negative for cough and shortness of breath.   Cardiovascular:  Negative for chest pain and palpitations.  Gastrointestinal:  Negative for diarrhea and vomiting.  Skin:  Negative for rash.  Neurological:  Positive for dizziness.  All other systems reviewed and are negative.    Physical Exam Triage Vital Signs ED Triage Vitals  Enc Vitals Group     BP 09/22/22 1642 129/83     Pulse Rate 09/22/22 1638 68     Resp 09/22/22 1638 18     Temp 09/22/22 1638 99 F (37.2 C)     Temp src --      SpO2 09/22/22 1638 98 %     Weight 09/22/22 1641 258 lb 6 oz (117.2 kg)     Height 09/22/22 1641 '5\' 6"'$  (1.676 m)     Head Circumference --      Peak Flow --      Pain Score 09/22/22 1638 0     Pain Loc --      Pain Edu? --      Excl. in [place]? --    No data found.  Updated Vital Signs BP 129/83   Pulse 68   Temp 99 F (37.2 C)   Resp 18   Ht '5\' 6"'$  (1.676 m)   Wt 258 lb 6 oz (117.2 kg)   LMP 03/23/2018   SpO2 98%   BMI 41.70 kg/m   Visual Acuity Right Eye Distance:   Left Eye Distance:   Bilateral Distance:    Right Eye Near:   Left Eye Near:    Bilateral  Near:      Physical Exam Vitals and nursing note reviewed.  Constitutional:      General: She is not in acute distress.    Appearance: She is well-developed. She is not ill-appearing.  HENT:     Right Ear: Tympanic membrane and ear canal normal.     Left Ear: Tympanic membrane and ear canal normal.     Nose: Nose normal.     Mouth/Throat:     Mouth: Mucous membranes are moist.     Pharynx: Oropharynx is clear.  Cardiovascular:     Rate and Rhythm: Normal rate and regular rhythm.     Heart sounds: Normal heart sounds.  Pulmonary:     Effort: Pulmonary effort is normal. No respiratory distress.     Breath sounds: Normal breath sounds.  Musculoskeletal:     Cervical back: Neck supple.  Skin:    General: Skin is warm and dry.  Neurological:     Mental Status: She is alert.  Psychiatric:        Mood and Affect: Mood normal.        Behavior: Behavior normal.      UC Treatments / Results  Labs (all labs ordered are listed, but only abnormal results are displayed) Labs Reviewed - No data to display  EKG   Radiology No results found.  Procedures Procedures (including critical care time)  Medications Ordered in UC Medications - No data to display  Initial Impression / Assessment and Plan / UC Course  I have reviewed the triage vital signs and the nursing notes.  Pertinent labs & imaging results that were available during my care of the patient were reviewed by me and considered in my medical decision making (see chart for details).    Right otalgia, COVID-19.  TMs intact with no sign of infection.  VSS.  Instructed patient to continue Paxlovid as directed.  Discussed symptomatic treatment including Tylenol or ibuprofen, rest, hydration.  Instructed patient to follow up with her PCP if her symptoms are not improving.  She agrees to plan of care.   Final Clinical Impressions(s) / UC Diagnoses   Final diagnoses:  Acute otalgia, right  COVID-19     Discharge Instructions       Take Tylenol or ibuprofen as needed for fever or discomfort.  Rest and keep yourself hydrated.    Follow-up with your primary care provider if your symptoms are not improving.         ED Prescriptions   None    PDMP not reviewed this encounter.   Sharion Balloon, NP 09/22/22 1719

## 2022-09-22 NOTE — Discharge Instructions (Addendum)
Take Tylenol or ibuprofen as needed for fever or discomfort.  Rest and keep yourself hydrated.    Follow-up with your primary care provider if your symptoms are not improving.

## 2022-09-22 NOTE — ED Triage Notes (Signed)
Patient to Urgent Care with complaints of bilateral ear drainage and ear popping that started today. Reports feeling dizzy after she blew her nose today and her ear popped.   Covid positive on Monday. Currently taking paxlovid.

## 2022-09-22 NOTE — Telephone Encounter (Signed)
I spoke with pt and she said she was seen on 09/18/22 and was + covid; pt said today about 2 PM. Pt blew her nose really hard and rt ear popped and then pt had immediate dizziness. The dizziness has subsided but both ears feel clogged and pt feels like in vacuum; pt has scratchy throat; pt said no cough, CP or SOB. Pt said on 09/18/22 had fluid in lt ear. Pt said no earache now and no drainage.  BP 124/71 P 75. No available appts at [facility] or [facility] and pt is going to [facility] to have someone ck her ears. Sending note to Dr Lorelei Pont who is out of office and who saw pt on 09/18/22 and [facility] pool. Pt has not rescheduled TOC since the 09/18/22 TOC was cancelled due to provider not in office. Pt appreciated call.

## 2022-09-22 NOTE — Telephone Encounter (Signed)
That seems like a reasonable plan.

## 2022-10-30 ENCOUNTER — Encounter (INDEPENDENT_AMBULATORY_CARE_PROVIDER_SITE_OTHER): Payer: Self-pay | Admitting: Internal Medicine

## 2022-10-30 ENCOUNTER — Ambulatory Visit (INDEPENDENT_AMBULATORY_CARE_PROVIDER_SITE_OTHER): Payer: 59 | Admitting: Internal Medicine

## 2022-10-30 ENCOUNTER — Encounter: Payer: Self-pay | Admitting: Family Medicine

## 2022-10-30 VITALS — BP 117/77 | HR 68 | Temp 98.2°F | Ht 64.0 in | Wt 254.0 lb

## 2022-10-30 DIAGNOSIS — R632 Polyphagia: Secondary | ICD-10-CM | POA: Diagnosis not present

## 2022-10-30 DIAGNOSIS — E669 Obesity, unspecified: Secondary | ICD-10-CM

## 2022-10-30 DIAGNOSIS — Z6841 Body Mass Index (BMI) 40.0 and over, adult: Secondary | ICD-10-CM | POA: Diagnosis not present

## 2022-10-30 DIAGNOSIS — G4733 Obstructive sleep apnea (adult) (pediatric): Secondary | ICD-10-CM

## 2022-10-30 MED ORDER — SEMAGLUTIDE-WEIGHT MANAGEMENT 0.25 MG/0.5ML ~~LOC~~ SOAJ: 0.2500 mg | SUBCUTANEOUS | 0 refills | Status: DC

## 2022-10-30 MED ORDER — SEMAGLUTIDE-WEIGHT MANAGEMENT 0.25 MG/0.5ML ~~LOC~~ SOAJ: 0.2500 mg | SUBCUTANEOUS | 0 refills | Status: AC

## 2022-10-30 NOTE — Progress Notes (Signed)
Chief Complaint:   OBESITY Brandi Ortiz is here to discuss her progress with her obesity treatment plan along with follow-up of her obesity related diagnoses. Brandi Ortiz is on the Category 3 Plan and states she is following her eating plan approximately 50-60% of the time. Brandi Ortiz states she is swimming for 30 minutes 2 times per week.  Today's visit was #: 6 Starting weight: 259 lbs Starting date: 06/08/2022 Today's weight: 254 lbs Today's date: 10/30/2022 Total lbs lost to date: 5 Total lbs lost since last in-office visit: 0  Interim History: Brandi Ortiz presents today for follow-up.  Since last office visit she has gained 5 pounds.  She is tearful and frustrated as she states is having a difficult time with hunger signals and cravings even when following prescribed reduced calorie nutritional plan.  Adherence to plan is fair.  Her goal is to lose weight so she could be healthier and improve her self-confidence and appearance.  She is also concerned about all her health problems and gets overwhelmed.  She travels often and has a busy work schedule.  She has tried phentermine in the past which did not help.  She inquires about pharmacotherapy to assist with her weight loss efforts.  Subjective:   1. Polyphagia Significant without binge eating features.  Counseled on behavioral strategies.  2. OSA (obstructive sleep apnea) Stable.  Patient reports good compliance with treatment.  She feels she has more energy.  Assessment/Plan:   1. Polyphagia In addition to nutritional, behavioral strategies she will be started on GLP-1 therapy.  2. OSA (obstructive sleep apnea) Continue PAP therapy.  Continue weight loss therapy, losing 15% of body weight may improve AHI.  3. Obesity,current BMI 43.7 In addition to prescribed reduced calorie nutritional plan and increasing physical activity I feel this patient would benefit from GLP-1 therapy to assist with hunger signals, satiety and cravings.  After  discussion of mechanisms of action, benefits, common side effects, indication of long-term use and shared decision making she is agreeable to starting Wegovy 0.25 mg once a week.  - Semaglutide-Weight Management 0.25 MG/0.5ML SOAJ; Inject 0.25 mg into the skin once a week for 28 days. (Patient not taking: Reported on 11/01/2022)  Dispense: 2 mL; Refill: 0  Brandi Ortiz is currently in the action stage of change. As such, her goal is to continue with weight loss efforts. She has agreed to the Category 3 Plan.   Exercise goals: For substantial health benefits, adults should do at least 150 minutes (2 hours and 30 minutes) a week of moderate-intensity, or 75 minutes (1 hour and 15 minutes) a week of vigorous-intensity aerobic physical activity, or an equivalent combination of moderate- and vigorous-intensity aerobic activity. Aerobic activity should be performed in episodes of at least 10 minutes, and preferably, it should be spread throughout the week.  Behavioral modification strategies: increasing lean protein intake, increasing vegetables, increasing water intake, no skipping meals, emotional eating strategies, travel eating strategies, avoiding temptations, and planning for success.  Brandi Ortiz has agreed to follow-up with our clinic in 3 weeks. She was informed of the importance of frequent follow-up visits to maximize her success with intensive lifestyle modifications for her multiple health conditions.   Objective:   Blood pressure 117/77, pulse 68, temperature 98.2 F (36.8 C), height '5\' 4"'$  (1.626 m), weight 254 lb (115.2 kg), last menstrual period 03/23/2018, SpO2 98 %. Body mass index is 43.6 kg/m.  General: Cooperative, alert, well developed, in no acute distress. HEENT: Conjunctivae and lids unremarkable. Cardiovascular:  Regular rhythm.  Lungs: Normal work of breathing. Neurologic: No focal deficits.   Lab Results  Component Value Date   CREATININE 1.03 (H) 06/08/2022   BUN 13  06/08/2022   NA 139 06/08/2022   K 4.6 06/08/2022   CL 99 06/08/2022   CO2 22 06/08/2022   Lab Results  Component Value Date   ALT 65 (H) 06/08/2022   AST 47 (H) 06/08/2022   ALKPHOS 160 (H) 06/08/2022   BILITOT 0.7 06/08/2022   Lab Results  Component Value Date   HGBA1C 5.1 06/08/2022   HGBA1C 4.8 07/19/2021   HGBA1C 4.5 (L) 04/15/2018   Lab Results  Component Value Date   INSULIN 39.3 (H) 06/08/2022   Lab Results  Component Value Date   TSH 1.550 06/08/2022   Lab Results  Component Value Date   CHOL 175 07/19/2021   HDL 52.60 07/19/2021   LDLCALC 96 07/19/2021   TRIG 131.0 07/19/2021   CHOLHDL 3 07/19/2021   Lab Results  Component Value Date   VD25OH 27.8 (L) 06/08/2022   VD25OH 29.84 (L) 07/19/2021   VD25OH 28.66 (L) 07/05/2020   Lab Results  Component Value Date   WBC 5.8 07/19/2021   HGB 13.5 07/19/2021   HCT 41.9 07/19/2021   MCV 85.0 07/19/2021   PLT 200.0 07/19/2021   Lab Results  Component Value Date   FERRITIN 141.7 07/05/2020   Attestation Statements:   Reviewed by clinician on day of visit: allergies, medications, problem list, medical history, surgical history, family history, social history, and previous encounter notes.   Wilhemena Durie, am acting as transcriptionist for Thomes Dinning, MD.  I have reviewed the above documentation for accuracy and completeness, and I agree with the above. -Thomes Dinning, MD

## 2022-10-31 NOTE — Progress Notes (Unsigned)
    Shaneisha Burkel T. Reneka Nebergall, MD, [place] at [facility] [place], [address]  Phone: [phone]  FAX: [phone]  Brandi Ortiz - 52 y.o. female  MRN [id number]  Date of Birth: Jun 07, 1971  Date: 11/01/2022  PCP: Waunita Schooner, MD  Referral: Waunita Schooner, MD  No chief complaint on file.  Subjective:   Brandi Ortiz is a 52 y.o. very pleasant female patient with There is no height or weight on file to calculate BMI. who presents with the following:  Very pleasant young lady who presents with left-sided knee pain.  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    Review of Systems is noted in the HPI, as appropriate  Objective:   LMP 03/23/2018   GEN: No acute distress; alert,appropriate. PULM: Breathing comfortably in no respiratory distress PSYCH: Normally interactive.   Laboratory and Imaging Data:  Assessment and Plan:   ***

## 2022-11-01 ENCOUNTER — Ambulatory Visit: Payer: 59 | Admitting: Family Medicine

## 2022-11-01 ENCOUNTER — Encounter: Payer: Self-pay | Admitting: Family Medicine

## 2022-11-01 VITALS — BP 120/72 | HR 65 | Temp 99.1°F | Ht 64.0 in | Wt 255.4 lb

## 2022-11-01 DIAGNOSIS — G8929 Other chronic pain: Secondary | ICD-10-CM | POA: Diagnosis not present

## 2022-11-01 DIAGNOSIS — M25562 Pain in left knee: Secondary | ICD-10-CM

## 2022-11-01 MED ORDER — MELOXICAM 15 MG PO TABS: 15.0000 mg | ORAL_TABLET | Freq: Every day | ORAL | 2 refills | Status: DC

## 2022-11-06 ENCOUNTER — Telehealth (INDEPENDENT_AMBULATORY_CARE_PROVIDER_SITE_OTHER): Payer: Self-pay

## 2022-11-06 NOTE — Telephone Encounter (Signed)
PA done

## 2022-11-23 ENCOUNTER — Ambulatory Visit (INDEPENDENT_AMBULATORY_CARE_PROVIDER_SITE_OTHER): Payer: POS | Admitting: Internal Medicine

## 2022-12-05 ENCOUNTER — Other Ambulatory Visit (INDEPENDENT_AMBULATORY_CARE_PROVIDER_SITE_OTHER): Payer: Self-pay | Admitting: Family Medicine

## 2022-12-05 DIAGNOSIS — E559 Vitamin D deficiency, unspecified: Secondary | ICD-10-CM

## 2022-12-25 ENCOUNTER — Ambulatory Visit (INDEPENDENT_AMBULATORY_CARE_PROVIDER_SITE_OTHER): Payer: 59 | Admitting: Physician Assistant

## 2022-12-25 ENCOUNTER — Encounter (INDEPENDENT_AMBULATORY_CARE_PROVIDER_SITE_OTHER): Payer: Self-pay | Admitting: Physician Assistant

## 2022-12-25 VITALS — BP 127/82 | HR 66 | Temp 98.2°F | Ht 64.0 in | Wt 253.0 lb

## 2022-12-25 DIAGNOSIS — G4733 Obstructive sleep apnea (adult) (pediatric): Secondary | ICD-10-CM

## 2022-12-25 DIAGNOSIS — R7989 Other specified abnormal findings of blood chemistry: Secondary | ICD-10-CM | POA: Diagnosis not present

## 2022-12-25 DIAGNOSIS — E669 Obesity, unspecified: Secondary | ICD-10-CM

## 2022-12-25 DIAGNOSIS — Z6841 Body Mass Index (BMI) 40.0 and over, adult: Secondary | ICD-10-CM

## 2022-12-25 DIAGNOSIS — R632 Polyphagia: Secondary | ICD-10-CM

## 2022-12-25 MED ORDER — WEGOVY 0.5 MG/0.5ML ~~LOC~~ SOAJ: 0.5000 mg | SUBCUTANEOUS | 0 refills | Status: DC

## 2022-12-25 NOTE — Progress Notes (Signed)
Office: [phone]  /  Fax: [phone]  WEIGHT SUMMARY AND BIOMETRICS  Vitals Temp: 98.2 F (36.8 C) BP: 127/82 Pulse Rate: 66 SpO2: 100 %   Anthropometric Measurements Height: '5\' 4"'$  (1.626 m) Weight: 253 lb (114.8 kg) BMI (Calculated): 43.41 Weight at Last Visit: 254 lb Starting Weight: 259 lb   Body Composition  Body Fat %: 45.2 % Fat Mass (lbs): 114.4 lbs Muscle Mass (lbs): 131.8 lbs Total Body Water (lbs): 93 lbs Visceral Fat Rating : 14   Other Clinical Data Fasting: no Labs: no Today's Visit #: 6 Starting Date: 06/08/22     HPI  Chief Complaint: OBESITY  Brandi Ortiz is here to discuss her progress with her obesity treatment plan. She is on the the Category 3 Plan and states she is following her eating plan approximately 75 % of the time. She states she is exercising 0 minutes 0 times per week.   Interval History:  Since last office visit she has done well with weight loss.  Hunger/appetite well controlled.  Not skipping meals, but does use protein shake with coffee in the mornings to stay on track with protein goals.  Moderate work stress. Increased personal stressors recently with family issues and working on strategies to help. Sleep is improved using a CPAP regularly for OSA.  Works for [employer] in [place] and travels to [place] regularly but lives here. Strategies for travel to [place] by car/staying on plan discussed.  Exercise- Walking a friend's dog for 30 minutes several times weekly and did not cause too much knee discomfort- Takes meloxicam for the knee.    Pharmacotherapy: On Wegovy 0.25 mg weekly. No side effects.   PHYSICAL EXAM:  Blood pressure 127/82, pulse 66, temperature 98.2 F (36.8 C), height '5\' 4"'$  (1.626 m), weight 253 lb (114.8 kg), last menstrual period 03/23/2018, SpO2 100 %. Body mass index is 43.43 kg/m.  General: She is overweight, cooperative, alert, well developed, and in no acute distress. PSYCH: Has normal mood, affect and thought  process.   Lungs: Normal breathing effort, no conversational dyspnea.  DIAGNOSTIC DATA REVIEWED:  BMET    Component Value Date/Time   NA 139 06/08/2022 0943   K 4.6 06/08/2022 0943   CL 99 06/08/2022 0943   CO2 22 06/08/2022 0943   GLUCOSE 106 (H) 06/08/2022 0943   GLUCOSE 90 07/19/2021 1156   BUN 13 06/08/2022 0943   CREATININE 1.03 (H) 06/08/2022 0943   CALCIUM 9.0 06/08/2022 0943   Lab Results  Component Value Date   HGBA1C 5.1 06/08/2022   HGBA1C 4.5 (L) 04/15/2018   Lab Results  Component Value Date   INSULIN 39.3 (H) 06/08/2022   Lab Results  Component Value Date   TSH 1.550 06/08/2022   CBC    Component Value Date/Time   WBC 5.8 07/19/2021 1156   RBC 4.93 07/19/2021 1156   HGB 13.5 07/19/2021 1156   HGB 13.7 04/15/2018 1537   HCT 41.9 07/19/2021 1156   HCT 42.1 04/15/2018 1537   PLT 200.0 07/19/2021 1156   PLT 236 04/15/2018 1537   MCV 85.0 07/19/2021 1156   MCV 86 04/15/2018 1537   MCH 27.8 04/15/2018 1537   MCHC 32.1 07/19/2021 1156   RDW 14.4 07/19/2021 1156   RDW 15.4 04/15/2018 1537   Iron Studies    Component Value Date/Time   FERRITIN 141.7 07/05/2020 1640   Lipid Panel     Component Value Date/Time   CHOL 175 07/19/2021 1156   TRIG 131.0 07/19/2021  1156   HDL 52.60 07/19/2021 1156   CHOLHDL 3 07/19/2021 1156   VLDL 26.2 07/19/2021 1156   LDLCALC 96 07/19/2021 1156   Hepatic Function Panel     Component Value Date/Time   PROT 7.2 06/08/2022 0943   ALBUMIN 4.4 06/08/2022 0943   AST 47 (H) 06/08/2022 0943   ALT 65 (H) 06/08/2022 0943   ALKPHOS 160 (H) 06/08/2022 0943   BILITOT 0.7 06/08/2022 0943      Component Value Date/Time   TSH 1.550 06/08/2022 0943   Nutritional Lab Results  Component Value Date   VD25OH 27.8 (L) 06/08/2022   VD25OH 29.84 (L) 07/19/2021   VD25OH 28.66 (L) 07/05/2020     ASSESSMENT AND PLAN  1. Polyphagia  2. OSA (obstructive sleep apnea)  3. Elevated LFTs  4. Obesity ([place])- Start BMI  41.8 - Semaglutide-Weight Management (WEGOVY) 0.5 MG/0.5ML SOAJ; Inject 0.5 mg into the skin once a week.  Dispense: 2 mL; Refill: 0  5. BMI 40.0-44.9, adult (HCC) Current BMI 43.5 - Semaglutide-Weight Management (WEGOVY) 0.5 MG/0.5ML SOAJ; Inject 0.5 mg into the skin once a week.  Dispense: 2 mL; Refill: 0     TREATMENT PLAN FOR OBESITY:  Recommended Dietary Goals  Brandi Ortiz is currently in the action stage of change. As such, her goal is to continue weight management plan. She has agreed to the Category 3 Plan.  Behavioral Intervention  We discussed the following Behavioral Modification Strategies today: increasing lean protein intake, decreasing simple carbohydrates , avoiding skipping meals, and work on meal planning and easy cooking plans.  Additional resources provided today: NA  Recommended Physical Activity Goals  Brandi Ortiz has been advised to work up to 150 minutes of moderate intensity aerobic activity a week and strengthening exercises 2-3 times per week for cardiovascular health, weight loss maintenance and preservation of muscle mass.   She has agreed to increase physical activity in their day and reduce sedentary time (increase NEAT).  and continue physical activity as is.    Pharmacotherapy We discussed various medication options to help Brandi Ortiz with her weight loss efforts and we both agreed to increase Wegovy to 0.5 mg weekly.  ASSOCIATED CONDITIONS ADDRESSED TODAY   Polyphagia Brandi Ortiz endorses excessive hunger.  Medication(s): [id number] Effects of medication:  moderately controlled. Cravings are well controlled.   Plan: Will increase Wegovy to 0.5 mg weekly and monitor response.   OSA using CPAP:  She is using CPAP regularly and feels much more rested.  Plan : Continue regular use of CPAP to promote weight loss and improve OSA.   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  Patient has two of more  risk factors for MASLD. Liver enzymes are elevated. Most recent imaging includes :[place] in past. Denies use of supplements, heavy alcohol consumption or steatogenic medications.   Plan:  Losing 15 % of body may lower risk. It is also recommended that she avoid processed foods, simple sugars, heavy alcohol consumption and steatogenic medications.    Return in about 3 weeks (around 01/15/2023).Marland Kitchen She was informed of the importance of frequent follow up visits to maximize her success with intensive lifestyle modifications for her multiple health conditions.   ATTESTASTION STATEMENTS:  Reviewed by clinician on day of visit: allergies, medications, problem list, medical history, surgical history, family history, social history, and previous encounter notes.   I have personally spent 30 minutes total time today in preparation, patient care, nutritional counseling and documentation for this  visit, including the following: review of clinical lab tests; review of medical tests/procedures/services.      Jamere Stidham, PA-C

## 2023-01-15 ENCOUNTER — Encounter (INDEPENDENT_AMBULATORY_CARE_PROVIDER_SITE_OTHER): Payer: Self-pay | Admitting: Physician Assistant

## 2023-01-15 ENCOUNTER — Ambulatory Visit (INDEPENDENT_AMBULATORY_CARE_PROVIDER_SITE_OTHER): Payer: 59 | Admitting: Physician Assistant

## 2023-01-15 VITALS — BP 145/88 | HR 63 | Temp 97.8°F | Ht 64.0 in | Wt 253.0 lb

## 2023-01-15 DIAGNOSIS — R632 Polyphagia: Secondary | ICD-10-CM

## 2023-01-15 DIAGNOSIS — R7989 Other specified abnormal findings of blood chemistry: Secondary | ICD-10-CM

## 2023-01-15 DIAGNOSIS — E669 Obesity, unspecified: Secondary | ICD-10-CM | POA: Diagnosis not present

## 2023-01-15 DIAGNOSIS — Z6841 Body Mass Index (BMI) 40.0 and over, adult: Secondary | ICD-10-CM | POA: Diagnosis not present

## 2023-01-15 MED ORDER — WEGOVY 1 MG/0.5ML ~~LOC~~ SOAJ: 1.0000 mg | SUBCUTANEOUS | 0 refills | Status: DC

## 2023-01-15 NOTE — Progress Notes (Signed)
Office: [phone]  /  Fax: [phone]  WEIGHT SUMMARY AND BIOMETRICS  Vitals Temp: 97.8 F (36.6 C) BP: (!) 145/88 Pulse Rate: 63 SpO2: 100 %   Anthropometric Measurements Height: 5\' 4"  (1.626 m) Weight: 253 lb (114.8 kg) BMI (Calculated): 43.41 Weight at Last Visit: 253 lb Weight Lost Since Last Visit: 0 lb Weight Gained Since Last Visit: 0 lb Starting Weight: 259 lb Total Weight Loss (lbs): 6 lb (2.722 kg)   Body Composition  Body Fat %: 45.8 % Fat Mass (lbs): 115.8 lbs Muscle Mass (lbs): 130.2 lbs Total Body Water (lbs): 95 lbs Visceral Fat Rating : 15   Other Clinical Data Fasting: No Labs: No Today's Visit #: 7 Starting Date: 06/08/22     HPI  Chief Complaint: OBESITY  Brandi Ortiz is here to discuss her progress with her obesity treatment plan. She is on the the Category 3 Plan and states she is following her eating plan approximately 65 % of the time. She states she is exercising 0 minutes 0 times per week.   Interval History:  Since last office visit she has been traveling a lot for work.  Has maintained.  Hunger and appetite excessive at times. Not skipping meals. Protein shake at times as meal substitute.  Coming up with good travel strategies for frequent travel with work for [employer].  She is going on a cruise soon for her 88 th birthday celebration.    Pharmacotherapy: Wegovy 0.5 mg weekly. No GI, no neck mass , mood stable on Wegovy.   PHYSICAL EXAM:  Blood pressure (!) 145/88, pulse 63, temperature 97.8 F (36.6 C), height 5\' 4"  (1.626 m), weight 253 lb (114.8 kg), last menstrual period 03/23/2018, SpO2 100 %. Body mass index is 43.43 kg/m.  General: She is overweight, cooperative, alert, well developed, and in no acute distress. PSYCH: Has normal mood, affect and thought process.   Lungs: Normal breathing effort, no conversational dyspnea.  DIAGNOSTIC DATA REVIEWED:  BMET    Component Value Date/Time   NA 139 06/08/2022 0943    K 4.6 06/08/2022 0943   CL 99 06/08/2022 0943   CO2 22 06/08/2022 0943   GLUCOSE 106 (H) 06/08/2022 0943   GLUCOSE 90 07/19/2021 1156   BUN 13 06/08/2022 0943   CREATININE 1.03 (H) 06/08/2022 0943   CALCIUM 9.0 06/08/2022 0943   Lab Results  Component Value Date   HGBA1C 5.1 06/08/2022   HGBA1C 4.5 (L) 04/15/2018   Lab Results  Component Value Date   INSULIN 39.3 (H) 06/08/2022   Lab Results  Component Value Date   TSH 1.550 06/08/2022   CBC    Component Value Date/Time   WBC 5.8 07/19/2021 1156   RBC 4.93 07/19/2021 1156   HGB 13.5 07/19/2021 1156   HGB 13.7 04/15/2018 1537   HCT 41.9 07/19/2021 1156   HCT 42.1 04/15/2018 1537   PLT 200.0 07/19/2021 1156   PLT 236 04/15/2018 1537   MCV 85.0 07/19/2021 1156   MCV 86 04/15/2018 1537   MCH 27.8 04/15/2018 1537   MCHC 32.1 07/19/2021 1156   RDW 14.4 07/19/2021 1156   RDW 15.4 04/15/2018 1537   Iron Studies    Component Value Date/Time   FERRITIN 141.7 07/05/2020 1640   Lipid Panel     Component Value Date/Time   CHOL 175 07/19/2021 1156   TRIG 131.0 07/19/2021 1156   HDL 52.60 07/19/2021 1156   CHOLHDL 3 07/19/2021 1156   VLDL 26.2 07/19/2021 1156  LDLCALC 96 07/19/2021 1156   Hepatic Function Panel     Component Value Date/Time   PROT 7.2 06/08/2022 0943   ALBUMIN 4.4 06/08/2022 0943   AST 47 (H) 06/08/2022 0943   ALT 65 (H) 06/08/2022 0943   ALKPHOS 160 (H) 06/08/2022 0943   BILITOT 0.7 06/08/2022 0943      Component Value Date/Time   TSH 1.550 06/08/2022 0943   Nutritional Lab Results  Component Value Date   VD25OH 27.8 (L) 06/08/2022   VD25OH 29.84 (L) 07/19/2021   VD25OH 28.66 (L) 07/05/2020    ASSOCIATED CONDITIONS ADDRESSED TODAY  ASSESSMENT AND PLAN  Problem List Items Addressed This Visit     Polyphagia - Primary    Polyphagia Brandi Ortiz endorses excessive hunger.  Medication(s): Wegovy 0.50 mg weekly. No side effects.  Effects of medication:  poorly controlled. Cravings are  poorly controlled.   Plan: Increase Wegovy to 1 mg weekly and monitor closely.  Continue working on nutrition plan to decrease simple carbohydrates, increase lean proteins and exercise to promote weight loss.        Obesity ([place])- Start BMI 41.8   Relevant Medications   Semaglutide-Weight Management (WEGOVY) 1 MG/0.5ML SOAJ   BMI 40.0-44.9, adult (HCC) Current BMI 43.4   Relevant Medications   Semaglutide-Weight Management (WEGOVY) 1 MG/0.5ML SOAJ      TREATMENT PLAN FOR OBESITY:  Recommended Dietary Goals  Brandi Ortiz is currently in the action stage of change. As such, her goal is to continue weight management plan. She has agreed to the Category 3 Plan.  Behavioral Intervention  We discussed the following Behavioral Modification Strategies today: increasing lean protein intake, decreasing simple carbohydrates , increasing vegetables, increasing water intake, work on meal planning and easy cooking plans, planning for success, and keeping healthy foods at home.  Additional resources provided today: NA  Recommended Physical Activity Goals  Brandi Ortiz has been advised to work up to 150 minutes of moderate intensity aerobic activity a week and strengthening exercises 2-3 times per week for cardiovascular health, weight loss maintenance and preservation of muscle mass.   She has agreed to Continue current level of physical activity    Pharmacotherapy We discussed various medication options to help Brandi Ortiz with her weight loss efforts and we both agreed to increase Wegovy to 1 mg weekly.    Return in about 3 weeks (around 02/05/2023).Marland Kitchen She was informed of the importance of frequent follow up visits to maximize her success with intensive lifestyle modifications for her multiple health conditions.   ATTESTASTION STATEMENTS:  Reviewed by clinician on day of visit: allergies, medications, problem list, medical history, surgical history, family history, social history, and previous  encounter notes.   I have personally spent 30 minutes total time today in preparation, patient care, nutritional counseling and documentation for this visit, including the following: review of clinical lab tests; review of medical tests/procedures/services.      Azavier Creson, PA-C

## 2023-01-15 NOTE — Assessment & Plan Note (Signed)
Polyphagia [place] endorses excessive hunger.  Medication(s): Wegovy 0.50 mg weekly. No side effects.  Effects of medication:  poorly controlled. Cravings are poorly controlled.   Plan: Increase Wegovy to 1 mg weekly and monitor closely.  Continue working on nutrition plan to decrease simple carbohydrates, increase lean proteins and exercise to promote weight loss.

## 2023-01-23 ENCOUNTER — Other Ambulatory Visit: Payer: Self-pay | Admitting: Family Medicine

## 2023-01-23 NOTE — Telephone Encounter (Signed)
Last office visit 11/01/22 with Dr. Lorelei Pont for chronic pain left knee.  Last refilled 11/01/22 for #30 with 2 refills.  Next Appt: No future appointments at [facility].

## 2023-02-05 ENCOUNTER — Encounter (INDEPENDENT_AMBULATORY_CARE_PROVIDER_SITE_OTHER): Payer: Self-pay | Admitting: Physician Assistant

## 2023-02-05 ENCOUNTER — Ambulatory Visit (INDEPENDENT_AMBULATORY_CARE_PROVIDER_SITE_OTHER): Payer: 59 | Admitting: Physician Assistant

## 2023-02-05 VITALS — BP 128/84 | HR 77 | Temp 98.8°F | Ht 64.0 in | Wt 250.0 lb

## 2023-02-05 DIAGNOSIS — Z6841 Body Mass Index (BMI) 40.0 and over, adult: Secondary | ICD-10-CM | POA: Insufficient documentation

## 2023-02-05 DIAGNOSIS — E669 Obesity, unspecified: Secondary | ICD-10-CM | POA: Diagnosis not present

## 2023-02-05 DIAGNOSIS — R7989 Other specified abnormal findings of blood chemistry: Secondary | ICD-10-CM | POA: Diagnosis not present

## 2023-02-05 DIAGNOSIS — E559 Vitamin D deficiency, unspecified: Secondary | ICD-10-CM | POA: Diagnosis not present

## 2023-02-05 DIAGNOSIS — R632 Polyphagia: Secondary | ICD-10-CM

## 2023-02-05 NOTE — Assessment & Plan Note (Signed)
Vitamin D Deficiency Vitamin D is not at goal of 50.  Most recent vitamin D level was 27.8. She is on OTC vitamin D3 2000 IU daily. Lab Results  Component Value Date   VD25OH 27.8 (L) 06/08/2022   VD25OH 29.84 (L) 07/19/2021   VD25OH 28.66 (L) 07/05/2020    Plan: Continue OTC vitamin D3 2000 IU daily Low vitamin D levels can be associated with adiposity and may result in leptin resistance and weight gain. Also associated with fatigue. Currently on vitamin D supplementation without any adverse effects.  Recheck vitamin D level next visit.

## 2023-02-05 NOTE — Assessment & Plan Note (Signed)
Polyphagia Currently this is moderately controlled. Medication(s): Wegovy 1.0 mg SQ weekly.  Reported side effects: None  Plan: Continue Wegovy 1.0 mg SQ weekly She is continuing to work on nutrition plan and exercise to promote weight loss. Will plan to recheck labs next visit.

## 2023-02-05 NOTE — Assessment & Plan Note (Signed)
  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  Patient has two of more risk factors for MASLD. Liver enzymes are elevated. Most recent imaging includes :[place] in past. Denies use of supplements, heavy alcohol consumption or steatogenic medications.   Plan:  Losing 15 % of body may lower risk. It is also recommended that she avoid processed foods, simple sugars, heavy alcohol consumption and steatogenic medications.   Will plan to recheck liver enzymes next visit.

## 2023-02-05 NOTE — Progress Notes (Signed)
Office: [phone]  /  Fax: [phone]  WEIGHT SUMMARY AND BIOMETRICS  Vitals Temp: 98.8 F (37.1 C) BP: 128/84 Pulse Rate: 77 SpO2: 98 %   Anthropometric Measurements Height: 5\' 4"  (1.626 m) Weight: 250 lb (113.4 kg) BMI (Calculated): 42.89 Weight at Last Visit: 253 lb Weight Lost Since Last Visit: 3 lb Weight Gained Since Last Visit: 0 lb Starting Weight: 259 lb Total Weight Loss (lbs): 9 lb (4.082 kg)   Body Composition  Body Fat %: 46.4 % Fat Mass (lbs): 116.4 lbs Muscle Mass (lbs): 127.6 lbs Total Body Water (lbs): 94.6 lbs Visceral Fat Rating : 15   Other Clinical Data Labs: no Today's Visit #: 8 Starting Date: 06/08/22     HPI  Chief Complaint: OBESITY  Brandi Ortiz is here to discuss her progress with her obesity treatment plan. She is on the the Category 3 Plan and states she is following her eating plan approximately 60 % of the time. She states she is exercising/walking/dancing.   Interval History:  Since last office visit she is down 3 lbs. She went on a cruise and did a lot of walking and dancing/was very active during the cruise.  Focusing on getting adequate protein.  Hunger/appetite controlled overall. Work - continues to travel regularly working for [employer] and incorporates protein shakes while traveling to help meet protein goals.    Pharmacotherapy: Wegovy 1 mg just starting 1 mg dose. No N/V/Diarrhea or constipation. No abd pain. No neck mass or difficulty swallowing. Mood is stable  PHYSICAL EXAM:  Blood pressure 128/84, pulse 77, temperature 98.8 F (37.1 C), height 5\' 4"  (1.626 m), weight 250 lb (113.4 kg), last menstrual period 03/23/2018, SpO2 98 %. Body mass index is 42.91 kg/m.  General: She is overweight, cooperative, alert, well developed, and in no acute distress. Cardiovascular: regular rhythm PSYCH: Has normal mood, affect and thought process.   Lungs: Normal breathing effort, no conversational dyspnea. Neuro: no  focal deficits  DIAGNOSTIC DATA REVIEWED:  BMET    Component Value Date/Time   NA 139 06/08/2022 0943   K 4.6 06/08/2022 0943   CL 99 06/08/2022 0943   CO2 22 06/08/2022 0943   GLUCOSE 106 (H) 06/08/2022 0943   GLUCOSE 90 07/19/2021 1156   BUN 13 06/08/2022 0943   CREATININE 1.03 (H) 06/08/2022 0943   CALCIUM 9.0 06/08/2022 0943   Lab Results  Component Value Date   HGBA1C 5.1 06/08/2022   HGBA1C 4.5 (L) 04/15/2018   Lab Results  Component Value Date   INSULIN 39.3 (H) 06/08/2022   Lab Results  Component Value Date   TSH 1.550 06/08/2022   CBC    Component Value Date/Time   WBC 5.8 07/19/2021 1156   RBC 4.93 07/19/2021 1156   HGB 13.5 07/19/2021 1156   HGB 13.7 04/15/2018 1537   HCT 41.9 07/19/2021 1156   HCT 42.1 04/15/2018 1537   PLT 200.0 07/19/2021 1156   PLT 236 04/15/2018 1537   MCV 85.0 07/19/2021 1156   MCV 86 04/15/2018 1537   MCH 27.8 04/15/2018 1537   MCHC 32.1 07/19/2021 1156   RDW 14.4 07/19/2021 1156   RDW 15.4 04/15/2018 1537   Iron Studies    Component Value Date/Time   FERRITIN 141.7 07/05/2020 1640   Lipid Panel     Component Value Date/Time   CHOL 175 07/19/2021 1156   TRIG 131.0 07/19/2021 1156   HDL 52.60 07/19/2021 1156   CHOLHDL 3 07/19/2021 1156   VLDL 26.2 07/19/2021  1156   LDLCALC 96 07/19/2021 1156   Hepatic Function Panel     Component Value Date/Time   PROT 7.2 06/08/2022 0943   ALBUMIN 4.4 06/08/2022 0943   AST 47 (H) 06/08/2022 0943   ALT 65 (H) 06/08/2022 0943   ALKPHOS 160 (H) 06/08/2022 0943   BILITOT 0.7 06/08/2022 0943      Component Value Date/Time   TSH 1.550 06/08/2022 0943   Nutritional Lab Results  Component Value Date   VD25OH 27.8 (L) 06/08/2022   VD25OH 29.84 (L) 07/19/2021   VD25OH 28.66 (L) 07/05/2020    ASSOCIATED CONDITIONS ADDRESSED TODAY  ASSESSMENT AND PLAN  Problem List Items Addressed This Visit     Vitamin D deficiency    Vitamin D Deficiency Vitamin D is not at goal of  50.  Most recent vitamin D level was 27.8. She is on OTC vitamin D3 2000 IU daily. Lab Results  Component Value Date   VD25OH 27.8 (L) 06/08/2022   VD25OH 29.84 (L) 07/19/2021   VD25OH 28.66 (L) 07/05/2020   Plan: Continue OTC vitamin D3 2000 IU daily Low vitamin D levels can be associated with adiposity and may result in leptin resistance and weight gain. Also associated with fatigue. Currently on vitamin D supplementation without any adverse effects.  Recheck vitamin D level next visit.        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  Patient has two of more risk factors for MASLD. Liver enzymes are elevated. Most recent imaging includes :[place] in past. Denies use of supplements, heavy alcohol consumption or steatogenic medications.   Plan:  Losing 15 % of body may lower risk. It is also recommended that she avoid processed foods, simple sugars, heavy alcohol consumption and steatogenic medications.   Will plan to recheck liver enzymes next visit.       Polyphagia - Primary    Polyphagia Currently this is moderately controlled. Medication(s): Wegovy 1.0 mg SQ weekly.  Reported side effects: None  Plan: Continue Wegovy 1.0 mg SQ weekly She is continuing to work on nutrition plan and exercise to promote weight loss. Will plan to recheck labs next visit.        Obesity (HCC)- Start BMI 41.8   BMI 40.0-44.9, adult (HCC) Current BMI 43.4   Current BMI 43.0 Down 3 lbs. / Down 9 lbs overall  TREATMENT PLAN FOR OBESITY:  Recommended Dietary Goals  [name] is currently in the action stage of change. As such, her goal is to continue weight management plan. She has agreed to the Category 2 Plan.  Behavioral Intervention  We discussed the following Behavioral Modification Strategies today: increasing lean protein intake, decreasing simple carbohydrates , increasing vegetables, avoiding skipping meals,  increasing water intake, and planning for success.  Additional resources provided today: NA  Recommended Physical Activity Goals  [name] has been advised to work up to 150 minutes of moderate intensity aerobic activity a week and strengthening exercises 2-3 times per week for cardiovascular health, weight loss maintenance and preservation of muscle mass.   She has agreed to Continue current level of physical activity    Pharmacotherapy We discussed various medication options to help Laurann with her weight loss efforts and we both agreed to continue Wegovy 1 mg weekly and she is going to advance day of dosing for better weekend coverage, continue to work on nutritional and behavioral strategies to promote weight loss.  Return in about 4 weeks (around 03/05/2023) for Fasting Lab.Marland Kitchen She was informed of the importance of frequent follow up visits to maximize her success with intensive lifestyle modifications for her multiple health conditions.   ATTESTASTION STATEMENTS:  Reviewed by clinician on day of visit: allergies, medications, problem list, medical history, surgical history, family history, social history, and previous encounter notes.   I have personally spent 30 minutes total time today in preparation, patient care, nutritional counseling and documentation for this visit, including the following: review of clinical lab tests; review of medical tests/procedures/services.      Haakon Titsworth, PA-C

## 2023-03-05 ENCOUNTER — Ambulatory Visit (INDEPENDENT_AMBULATORY_CARE_PROVIDER_SITE_OTHER): Payer: 59 | Admitting: Physician Assistant

## 2023-03-05 ENCOUNTER — Telehealth (INDEPENDENT_AMBULATORY_CARE_PROVIDER_SITE_OTHER): Payer: Self-pay

## 2023-03-05 ENCOUNTER — Encounter (INDEPENDENT_AMBULATORY_CARE_PROVIDER_SITE_OTHER): Payer: Self-pay | Admitting: Physician Assistant

## 2023-03-05 VITALS — BP 103/71 | HR 65 | Temp 98.0°F | Ht 64.0 in | Wt 243.0 lb

## 2023-03-05 DIAGNOSIS — Z6841 Body Mass Index (BMI) 40.0 and over, adult: Secondary | ICD-10-CM

## 2023-03-05 DIAGNOSIS — E669 Obesity, unspecified: Secondary | ICD-10-CM

## 2023-03-05 DIAGNOSIS — R7989 Other specified abnormal findings of blood chemistry: Secondary | ICD-10-CM | POA: Diagnosis not present

## 2023-03-05 DIAGNOSIS — E559 Vitamin D deficiency, unspecified: Secondary | ICD-10-CM

## 2023-03-05 DIAGNOSIS — R632 Polyphagia: Secondary | ICD-10-CM

## 2023-03-05 DIAGNOSIS — E88819 Insulin resistance, unspecified: Secondary | ICD-10-CM

## 2023-03-05 MED ORDER — WEGOVY 1 MG/0.5ML ~~LOC~~ SOAJ: 1.0000 mg | SUBCUTANEOUS | 0 refills | Status: DC

## 2023-03-05 NOTE — Progress Notes (Signed)
.smr  Office: [phone]  /  Fax: [phone]  WEIGHT SUMMARY AND BIOMETRICS  Vitals Temp: 98 F (36.7 C) BP: 103/71 Pulse Rate: 65 SpO2: 100 %   Anthropometric Measurements Height: 5\' 4"  (1.626 m) Weight: 243 lb (110.2 kg) BMI (Calculated): 41.69 Weight at Last Visit: 250 lb Weight Lost Since Last Visit: 7 lb Weight Gained Since Last Visit: 0 lb Starting Weight: 259 lb   Body Composition  Body Fat %: 44.5 % Fat Mass (lbs): 108.2 lbs Muscle Mass (lbs): 128.2 lbs Total Body Water (lbs): 90.4 lbs Visceral Fat Rating : 14   Other Clinical Data Fasting: yes Labs: yes Today's Visit #: 9 Starting Date: 06/08/22     HPI  Chief Complaint: OBESITY  Brandi Ortiz is here to discuss her progress with her obesity treatment plan. She is on the the Category 3 Plan and states she is following her eating plan approximately 65 % of the time. She states she is exercising 30 minutes 1 times per week.   Interval History:  Since last office visit she is down 7 lbs.  She had increased travel over the past 3 weeks and had to stay at a hotel for a portion of this. Working on travel strategies/ trying to make better choices at hotel which has free breakfast. Then eating lunches at cafeteria thru work and making better choices as well. Concentrating on getting adequate protein.  Hunger/appetite-well controlled. Not skipping meals. Using protein shakes to supplement.  Cravings- controlled. Not excessive Stress- increased with increased traveling, but coping well.  Exercise-starting to walk at least 1 day weekly/doing strengthening exercises.  Hydration-Working on increasing water intake. Overall adequate intake.    Pharmacotherapy: Wegovy for weight loss. Denies mass in neck, dysphagia, dyspepsia, persistent hoarseness, abdominal pain, or N/V/Constipation or diarrhea. Has annual eye exam. Mood is stable.  Current BMI 41.7  TREATMENT PLAN FOR OBESITY:  Recommended Dietary  Goals  Brandi Ortiz is currently in the action stage of change. As such, her goal is to continue weight management plan. She has agreed to the Category 3 Plan.  Behavioral Intervention  We discussed the following Behavioral Modification Strategies today: increasing lean protein intake, decreasing simple carbohydrates , increasing vegetables, increasing lower glycemic fruits, avoiding skipping meals, increasing water intake, continue to practice mindfulness when eating, planning for success, and staying on track while traveling and vacationing.  Additional resources provided today: NA  Recommended Physical Activity Goals  Brandi Ortiz has been advised to work up to 150 minutes of moderate intensity aerobic activity a week and strengthening exercises 2-3 times per week for cardiovascular health, weight loss maintenance and preservation of muscle mass.   She has agreed to Increase the intensity, frequency or duration of aerobic exercises   and Increase physical activity in their day and reduce sedentary time (increase NEAT).   Pharmacotherapy We discussed various medication options to help [name] with her weight loss efforts and we both agreed to continue Wegovy 1 mg weekly.    Return in about 4 weeks (around 04/02/2023).Marland Kitchen She was informed of the importance of frequent follow up visits to maximize her success with intensive lifestyle modifications for her multiple health conditions.  PHYSICAL EXAM:  Blood pressure 103/71, pulse 65, temperature 98 F (36.7 C), height 5\' 4"  (1.626 m), weight 243 lb (110.2 kg), last menstrual period 03/23/2018, SpO2 100 %. Body mass index is 41.71 kg/m.  General: She is overweight, cooperative, alert, well developed, and in no acute distress. PSYCH: Has normal mood, affect and thought  process.   Cardiovascular: HR 80's regular Lungs: Normal breathing effort, no conversational dyspnea. Neuro: no focal deficits.   DIAGNOSTIC DATA REVIEWED:  BMET     Component Value Date/Time   NA 139 06/08/2022 0943   K 4.6 06/08/2022 0943   CL 99 06/08/2022 0943   CO2 22 06/08/2022 0943   GLUCOSE 106 (H) 06/08/2022 0943   GLUCOSE 90 07/19/2021 1156   BUN 13 06/08/2022 0943   CREATININE 1.03 (H) 06/08/2022 0943   CALCIUM 9.0 06/08/2022 0943   Lab Results  Component Value Date   HGBA1C 5.1 06/08/2022   HGBA1C 4.5 (L) 04/15/2018   Lab Results  Component Value Date   INSULIN 39.3 (H) 06/08/2022   Lab Results  Component Value Date   TSH 1.550 06/08/2022   CBC    Component Value Date/Time   WBC 5.8 07/19/2021 1156   RBC 4.93 07/19/2021 1156   HGB 13.5 07/19/2021 1156   HGB 13.7 04/15/2018 1537   HCT 41.9 07/19/2021 1156   HCT 42.1 04/15/2018 1537   PLT 200.0 07/19/2021 1156   PLT 236 04/15/2018 1537   MCV 85.0 07/19/2021 1156   MCV 86 04/15/2018 1537   MCH 27.8 04/15/2018 1537   MCHC 32.1 07/19/2021 1156   RDW 14.4 07/19/2021 1156   RDW 15.4 04/15/2018 1537   Iron Studies    Component Value Date/Time   FERRITIN 141.7 07/05/2020 1640   Lipid Panel     Component Value Date/Time   CHOL 175 07/19/2021 1156   TRIG 131.0 07/19/2021 1156   HDL 52.60 07/19/2021 1156   CHOLHDL 3 07/19/2021 1156   VLDL 26.2 07/19/2021 1156   LDLCALC 96 07/19/2021 1156   Hepatic Function Panel     Component Value Date/Time   PROT 7.2 06/08/2022 0943   ALBUMIN 4.4 06/08/2022 0943   AST 47 (H) 06/08/2022 0943   ALT 65 (H) 06/08/2022 0943   ALKPHOS 160 (H) 06/08/2022 0943   BILITOT 0.7 06/08/2022 0943      Component Value Date/Time   TSH 1.550 06/08/2022 0943   Nutritional Lab Results  Component Value Date   VD25OH 27.8 (L) 06/08/2022   VD25OH 29.84 (L) 07/19/2021   VD25OH 28.66 (L) 07/05/2020    ASSOCIATED CONDITIONS ADDRESSED TODAY  ASSESSMENT AND PLAN  Problem List Items Addressed This Visit     Vitamin D deficiency   Relevant Orders   VITAMIN D 25 Hydroxy (Vit-D Deficiency, Fractures)   Elevated LFTs   Relevant Orders    CBC with Differential/Platelet   Lipid Panel With LDL/HDL Ratio   Insulin resistance   Relevant Orders   CMP14+EGFR   Hemoglobin A1c   Insulin, random   Polyphagia - Primary   Obesity (HCC)- Start BMI 41.8   Relevant Medications   Semaglutide-Weight Management (WEGOVY) 1 MG/0.5ML SOAJ   BMI 40.0-44.9, adult (HCC) Current BMI 43.4   Relevant Medications   Semaglutide-Weight Management (WEGOVY) 1 MG/0.5ML SOAJ   Insulin Resistance Last fasting insulin was 39.3. A1c was 5.1. Polyphagia:No Medication(s): Wegovy 1.0 mg SQ weekly Denies mass in neck, dysphagia, dyspepsia, persistent hoarseness, abdominal pain, or N/V/Constipation or diarrhea. Has annual eye exam. Mood is stable.   Lab Results  Component Value Date   HGBA1C 5.1 06/08/2022   HGBA1C 4.8 07/19/2021   HGBA1C 4.5 (L) 04/15/2018   Lab Results  Component Value Date   INSULIN 39.3 (H) 06/08/2022    Plan: Continue and refill Wegovy 1.0 mg SQ weekly Increase protein intake. Increase fiber  intake.  Continue working on nutrition plan to decrease simple carbohydrates, increase lean proteins and exercise to promote weight loss, improve glycemic control and prevent progression to Type 2 diabetes.    Polyphagia Currently this is moderately controlled. Medication(s): Wegovy 1.0 mg SQ weekly.  Reported side effects: None Denies mass in neck, dysphagia, dyspepsia, persistent hoarseness, abdominal pain, or N/V/Constipation or diarrhea. Has annual eye exam. Mood is stable.    Plan: Continue and refill Wegovy 1.0 mg SQ weekly Continue to work on nutrition plan and exercise to promote weight loss, maintain muscle mass.    Elevated Liver function: Brandi Ortiz has a dx of elevated ALT. Her BMI is over 40. She denies abdominal pain or jaundice and has never been told of any liver problems in the past. She denies excessive alcohol intake.  Lab Results  Component Value Date   ALT 65 (H) 06/08/2022   AST 47 (H) 06/08/2022   ALKPHOS  160 (H) 06/08/2022   BILITOT 0.7 06/08/2022   Vitamin D Deficiency Vitamin D is not at goal of 50.  Most recent vitamin D level was 27.8. She is on no vitamin D currently. . Lab Results  Component Value Date   VD25OH 27.8 (L) 06/08/2022   VD25OH 29.84 (L) 07/19/2021   VD25OH 28.66 (L) 07/05/2020    Plan: Recheck vitamin D level today and supplement accordingly.  Low vitamin D levels can be associated with adiposity and may result in leptin resistance and weight gain. Also associated with fatigue. Currently on vitamin D supplementation without any adverse effects.       ATTESTASTION STATEMENTS:  Reviewed by clinician on day of visit: allergies, medications, problem list, medical history, surgical history, family history, social history, and previous encounter notes.   I have personally spent 41 minutes total time today in preparation, patient care, nutritional counseling and documentation for this visit, including the following: review of clinical lab tests; review of medical tests/procedures/services.      Blake Goya, PA-C

## 2023-03-05 NOTE — Telephone Encounter (Signed)
Started PA today for Wegovy 1 mg.  Brandi Ortiz (Key: [id number])  form thumbnail This request has been approved.  Please note any additional information provided by [facility] at the bottom of your screen.

## 2023-03-06 LAB — CMP14+EGFR
ALT: 28 IU/L (ref 0–32)
AST: 21 IU/L (ref 0–40)
Albumin/Globulin Ratio: 1.8 (ref 1.2–2.2)
Albumin: 4.4 g/dL (ref 3.9–4.9)
Alkaline Phosphatase: 153 IU/L — ABNORMAL HIGH (ref 44–121)
BUN/Creatinine Ratio: 19 (ref 9–23)
BUN: 18 mg/dL (ref 6–24)
Bilirubin Total: 0.8 mg/dL (ref 0.0–1.2)
CO2: 21 mmol/L (ref 20–29)
Calcium: 8.9 mg/dL (ref 8.7–10.2)
Chloride: 105 mmol/L (ref 96–106)
Creatinine, Ser: 0.95 mg/dL (ref 0.57–1.00)
Globulin, Total: 2.4 g/dL (ref 1.5–4.5)
Glucose: 87 mg/dL (ref 70–99)
Potassium: 4.2 mmol/L (ref 3.5–5.2)
Sodium: 142 mmol/L (ref 134–144)
Total Protein: 6.8 g/dL (ref 6.0–8.5)
eGFR: 73 mL/min/{1.73_m2} (ref >59)

## 2023-03-06 LAB — LIPID PANEL WITH LDL/HDL RATIO
Cholesterol, Total: 143 mg/dL (ref 100–199)
HDL: 53 mg/dL (ref >39)
LDL Chol Calc (NIH): 75 mg/dL (ref 0–99)
LDL/HDL Ratio: 1.4 ratio (ref 0.0–3.2)
Triglycerides: 79 mg/dL (ref 0–149)
VLDL Cholesterol Cal: 15 mg/dL (ref 5–40)

## 2023-03-06 LAB — CBC WITH DIFFERENTIAL/PLATELET
Basophils Absolute: 0 10*3/uL (ref 0.0–0.2)
Basos: 1 %
EOS (ABSOLUTE): 0.2 10*3/uL (ref 0.0–0.4)
Eos: 3 %
Hematocrit: 43.3 % (ref 34.0–46.6)
Hemoglobin: 13.9 g/dL (ref 11.1–15.9)
Immature Grans (Abs): 0 10*3/uL (ref 0.0–0.1)
Immature Granulocytes: 0 %
Lymphocytes Absolute: 1.9 10*3/uL (ref 0.7–3.1)
Lymphs: 30 %
MCH: 27.2 pg (ref 26.6–33.0)
MCHC: 32.1 g/dL (ref 31.5–35.7)
MCV: 85 fL (ref 79–97)
Monocytes Absolute: 0.3 10*3/uL (ref 0.1–0.9)
Monocytes: 5 %
Neutrophils Absolute: 3.9 10*3/uL (ref 1.4–7.0)
Neutrophils: 61 %
Platelets: 246 10*3/uL (ref 150–450)
RBC: 5.11 x10E6/uL (ref 3.77–5.28)
RDW: 14.7 % (ref 11.7–15.4)
WBC: 6.4 10*3/uL (ref 3.4–10.8)

## 2023-03-06 LAB — HEMOGLOBIN A1C
Est. average glucose Bld gHb Est-mCnc: 82 mg/dL
Hgb A1c MFr Bld: 4.5 % — ABNORMAL LOW (ref 4.8–5.6)

## 2023-03-06 LAB — INSULIN, RANDOM: INSULIN: 41.6 u[IU]/mL — ABNORMAL HIGH (ref 2.6–24.9)

## 2023-03-06 LAB — VITAMIN D 25 HYDROXY (VIT D DEFICIENCY, FRACTURES): Vit D, 25-Hydroxy: 30.9 ng/mL (ref 30.0–100.0)

## 2023-03-27 ENCOUNTER — Ambulatory Visit (INDEPENDENT_AMBULATORY_CARE_PROVIDER_SITE_OTHER): Payer: 59 | Admitting: Physician Assistant

## 2023-03-27 ENCOUNTER — Encounter (INDEPENDENT_AMBULATORY_CARE_PROVIDER_SITE_OTHER): Payer: Self-pay | Admitting: Physician Assistant

## 2023-03-27 VITALS — BP 110/76 | HR 76 | Temp 98.4°F | Ht 64.0 in | Wt 242.0 lb

## 2023-03-27 DIAGNOSIS — R7989 Other specified abnormal findings of blood chemistry: Secondary | ICD-10-CM

## 2023-03-27 DIAGNOSIS — K5903 Drug induced constipation: Secondary | ICD-10-CM

## 2023-03-27 DIAGNOSIS — E559 Vitamin D deficiency, unspecified: Secondary | ICD-10-CM

## 2023-03-27 DIAGNOSIS — E88819 Insulin resistance, unspecified: Secondary | ICD-10-CM

## 2023-03-27 DIAGNOSIS — E669 Obesity, unspecified: Secondary | ICD-10-CM

## 2023-03-27 DIAGNOSIS — R632 Polyphagia: Secondary | ICD-10-CM

## 2023-03-27 DIAGNOSIS — Z6841 Body Mass Index (BMI) 40.0 and over, adult: Secondary | ICD-10-CM

## 2023-03-27 MED ORDER — WEGOVY 1 MG/0.5ML ~~LOC~~ SOAJ: 1.0000 mg | SUBCUTANEOUS | 0 refills | Status: DC

## 2023-03-27 NOTE — Progress Notes (Signed)
.smr  Office: [phone]  /  Fax: [phone]  WEIGHT SUMMARY AND BIOMETRICS  Vitals Temp: 98.4 F (36.9 C) BP: 110/76 Pulse Rate: 76 SpO2: 99 %   Anthropometric Measurements Height: 5\' 4"  (1.626 m) Weight: 242 lb (109.8 kg) BMI (Calculated): 41.52 Weight at Last Visit: 243 lb Weight Lost Since Last Visit: 1 lb Starting Weight: 259 lb   Body Composition  Body Fat %: 43.4 % Fat Mass (lbs): 105 lbs Muscle Mass (lbs): 130 lbs Total Body Water (lbs): 90.8 lbs Visceral Fat Rating : 13   Other Clinical Data Fasting: yes Labs: No Today's Visit #: 10 Starting Date: 06/08/22     HPI  Chief Complaint: OBESITY  Brandi Ortiz is here to discuss her progress with her obesity treatment plan. She is on the the Category 3 Plan and states she is following her eating plan approximately 60 % of the time. She states she is exercising 30 minutes 2-3 times per week.   Interval History:  Since last office visit she down 1 lb.  She had a house guest and was taking her nephew to school daily and stopping to get fast food on the way and reports she got off track with her nutrition plan. She is working to get back on track and has gotten back to meal planning and prepping again.   Hunger/appetite-notes decreased appetite as constipation has become worsened Cravings-not excessive Stress-manageable overall.  She continues to travel regularly with work Sleep-using CPAP for OSA, but feels like her results are not optimal lately and will pursue follow-up with her CPAP provider Exercise-starting to get up early and exercise on a more regular basis and is trying to be more consistent with this. Hydration-working on improving overall hydration, not always adequate   Pharmacotherapy: Wegovy 1 mg weekly. Denies mass in neck, dysphagia, dyspepsia, persistent hoarseness, abdominal pain, or N/V/Constipation or diarrhea. Has annual eye exam. Mood is stable.    TREATMENT PLAN FOR  OBESITY:  Recommended Dietary Goals  Brandi Ortiz is currently in the action stage of change. As such, her goal is to continue weight management plan. She has agreed to the Category 3 Plan.  Behavioral Intervention  We discussed the following Behavioral Modification Strategies today: increasing lean protein intake, decreasing simple carbohydrates , increasing vegetables, increasing lower glycemic fruits, increasing fiber rich foods, avoiding skipping meals, increasing water intake, continue to practice mindfulness when eating, and planning for success.  Additional resources provided today: NA  Recommended Physical Activity Goals  Brandi Ortiz has been advised to work up to 150 minutes of moderate intensity aerobic activity a week and strengthening exercises 2-3 times per week for cardiovascular health, weight loss maintenance and preservation of muscle mass.   She has agreed to Continue current level of physical activity  and Increase the intensity, frequency or duration of aerobic exercises     Pharmacotherapy We discussed various medication options to help Brandi Ortiz with her weight loss efforts and we both agreed to continue Wegovy 1 mg weekly.    Return in about 4 weeks (around 04/24/2023).Marland Kitchen She was informed of the importance of frequent follow up visits to maximize her success with intensive lifestyle modifications for her multiple health conditions.  PHYSICAL EXAM:  Blood pressure 110/76, pulse 76, temperature 98.4 F (36.9 C), height 5\' 4"  (1.626 m), weight 242 lb (109.8 kg), last menstrual period 03/23/2018, SpO2 99 %. Body mass index is 41.54 kg/m.  General: She is overweight, cooperative, alert, well developed, and in no acute distress. PSYCH: Has  normal mood, affect and thought process.   Cardiovascular: HR 70's regular Lungs: Normal breathing effort, no conversational dyspnea. Neuro: no focal deficits  DIAGNOSTIC DATA REVIEWED:  BMET    Component Value Date/Time   NA 142  03/05/2023 0924   K 4.2 03/05/2023 0924   CL 105 03/05/2023 0924   CO2 21 03/05/2023 0924   GLUCOSE 87 03/05/2023 0924   GLUCOSE 90 07/19/2021 1156   BUN 18 03/05/2023 0924   CREATININE 0.95 03/05/2023 0924   CALCIUM 8.9 03/05/2023 0924   Lab Results  Component Value Date   HGBA1C 4.5 (L) 03/05/2023   HGBA1C 4.5 (L) 04/15/2018   Lab Results  Component Value Date   INSULIN 41.6 (H) 03/05/2023   INSULIN 39.3 (H) 06/08/2022   Lab Results  Component Value Date   TSH 1.550 06/08/2022   CBC    Component Value Date/Time   WBC 6.4 03/05/2023 0924   WBC 5.8 07/19/2021 1156   RBC 5.11 03/05/2023 0924   RBC 4.93 07/19/2021 1156   HGB 13.9 03/05/2023 0924   HCT 43.3 03/05/2023 0924   PLT 246 03/05/2023 0924   MCV 85 03/05/2023 0924   MCH 27.2 03/05/2023 0924   MCHC 32.1 03/05/2023 0924   MCHC 32.1 07/19/2021 1156   RDW 14.7 03/05/2023 0924   Iron Studies    Component Value Date/Time   FERRITIN 141.7 07/05/2020 1640   Lipid Panel     Component Value Date/Time   CHOL 143 03/05/2023 0924   TRIG 79 03/05/2023 0924   HDL 53 03/05/2023 0924   CHOLHDL 3 07/19/2021 1156   VLDL 26.2 07/19/2021 1156   LDLCALC 75 03/05/2023 0924   Hepatic Function Panel     Component Value Date/Time   PROT 6.8 03/05/2023 0924   ALBUMIN 4.4 03/05/2023 0924   AST 21 03/05/2023 0924   ALT 28 03/05/2023 0924   ALKPHOS 153 (H) 03/05/2023 0924   BILITOT 0.8 03/05/2023 0924      Component Value Date/Time   TSH 1.550 06/08/2022 0943   Nutritional Lab Results  Component Value Date   VD25OH 30.9 03/05/2023   VD25OH 27.8 (L) 06/08/2022   VD25OH 29.84 (L) 07/19/2021    ASSOCIATED CONDITIONS ADDRESSED TODAY  ASSESSMENT AND PLAN  Problem List Items Addressed This Visit     Vitamin D deficiency   Elevated LFTs   Insulin resistance - Primary   Obesity (HCC)- Start BMI 41.8   Relevant Medications   Semaglutide-Weight Management (WEGOVY) 1 MG/0.5ML SOAJ   BMI 40.0-44.9, adult (HCC)  Current BMI 43.4   Relevant Medications   Semaglutide-Weight Management (WEGOVY) 1 MG/0.5ML SOAJ   Other Visit Diagnoses     Drug-induced constipation          Vitamin D Deficiency Labs were reviewed today and discussed with the patient.  Vitamin D is not at goal of 50.  Most recent vitamin D level was 30.9. She is on OTC vitamin D3 1000 IU daily. No side effects with OTC vitamin D.  Lab Results  Component Value Date   VD25OH 30.9 03/05/2023   VD25OH 27.8 (L) 06/08/2022   VD25OH 29.84 (L) 07/19/2021    Plan: Continue and increase dose OTC vitamin D3 2000 IU daily Low vitamin D levels can be associated with adiposity and may result in leptin resistance and weight gain. Also associated with fatigue. Currently on vitamin D supplementation without any adverse effects.  Will recheck level in 3-4 months.   Insulin Resistance Labs were reviewed  today and discussed with the patient.  Last fasting insulin was 41.6. A1c was 4.5. Polyphagia:No Medication(s): Wegovy 1.0 mg SQ weekly Denies mass in neck, dysphagia, dyspepsia, persistent hoarseness, abdominal pain, or N/V or diarrhea. Has annual eye exam. Mood is stable.  She has been having more difficulty with constipation. See below.  Lab Results  Component Value Date   HGBA1C 4.5 (L) 03/05/2023   HGBA1C 5.1 06/08/2022   HGBA1C 4.8 07/19/2021   HGBA1C 4.5 (L) 04/15/2018   Lab Results  Component Value Date   INSULIN 41.6 (H) 03/05/2023   INSULIN 39.3 (H) 06/08/2022    Plan: Continue and refill Wegovy 1.0 mg SQ weekly Continue working on nutrition plan to decrease simple carbohydrates, increase lean proteins and exercise to promote weight loss, improve glycemic control and prevent progression to Type 2 diabetes.    Elevated LFT/ Elevated alkaline phosphatase:  Plan:  Lab results reviewed with patient.  Disease counseling done.  Intensive lifestyle modifications are the first line treatment for this issue.  We discussed several  lifestyle modifications today and she will continue to work on diet, exercise and weight loss efforts.   Counseling: NAFLD is an umbrella term that encompasses a disease spectrum that includes steatosis (fat) without inflammation, steatohepatitis (NASH; fat + inflammation in a characteristic pattern), and cirrhosis. Bland steatosis is felt to be a benign condition, with extremely low to no risk of progression to cirrhosis, whereas NASH can progress to cirrhosis. The mainstay of treatment of NAFLD includes lifestyle modification to achieve weight loss, at least 7% of current body weight. Low carbohydrate diets can be beneficial in improving NAFLD liver histology. Additionally, exercise, even the absence of weight loss can have beneficial effects on the patient's metabolic profile and liver health. We recommend that their metabolic comorbidities be aggressively managed, as patients with NAFLD are at increased risk of coronary artery disease.   Constipation Cing notes constipation.   This is likely related to GLP-1.  She has been taking dulcolax tablets. Constipation is poorly controlled.   Plan: She is going to take laxative, either dulcolax or use miralax 238 grams in gatorade x 64 oz ( similar to bowel prep for colonoscopy) to get back to baseline and then start miralax daily and monitor closely.  Increase fiber and water. Add MiraLAX once daily.  May take twice a day or every other day based on results.   ATTESTASTION STATEMENTS:  Reviewed by clinician on day of visit: allergies, medications, problem list, medical history, surgical history, family history, social history, and previous encounter notes.   I have personally spent 35 minutes total time today in preparation, patient care, nutritional counseling and documentation for this visit, including the following: review of clinical lab tests; review of medical tests/procedures/services.      Tanga Gloor, PA-C

## 2023-03-28 DIAGNOSIS — K5903 Drug induced constipation: Secondary | ICD-10-CM | POA: Insufficient documentation

## 2023-03-30 ENCOUNTER — Other Ambulatory Visit (INDEPENDENT_AMBULATORY_CARE_PROVIDER_SITE_OTHER): Payer: Self-pay | Admitting: Physician Assistant

## 2023-04-02 ENCOUNTER — Encounter (INDEPENDENT_AMBULATORY_CARE_PROVIDER_SITE_OTHER): Payer: Self-pay | Admitting: Physician Assistant

## 2023-04-02 ENCOUNTER — Other Ambulatory Visit (INDEPENDENT_AMBULATORY_CARE_PROVIDER_SITE_OTHER): Payer: Self-pay | Admitting: Physician Assistant

## 2023-04-02 MED ORDER — WEGOVY 1.7 MG/0.75ML ~~LOC~~ SOAJ
1.7000 mg | SUBCUTANEOUS | 0 refills | Status: DC
Start: 2023-04-02 — End: 2023-05-01

## 2023-04-21 ENCOUNTER — Other Ambulatory Visit: Payer: Self-pay | Admitting: Family Medicine

## 2023-04-23 NOTE — Telephone Encounter (Signed)
Last office visit 11/01/2022 for left knee pain.  Last refilled 01/23/23 for #30 with 2 refills.  Next Appt:  No future appointments at [facility].   Dr. Selena Batten still listed as PCP.

## 2023-05-01 ENCOUNTER — Encounter (INDEPENDENT_AMBULATORY_CARE_PROVIDER_SITE_OTHER): Payer: Self-pay | Admitting: Physician Assistant

## 2023-05-01 ENCOUNTER — Ambulatory Visit (INDEPENDENT_AMBULATORY_CARE_PROVIDER_SITE_OTHER): Payer: 59 | Admitting: Physician Assistant

## 2023-05-01 VITALS — BP 117/75 | HR 70 | Temp 98.2°F | Ht 64.0 in | Wt 238.0 lb

## 2023-05-01 DIAGNOSIS — E559 Vitamin D deficiency, unspecified: Secondary | ICD-10-CM | POA: Diagnosis not present

## 2023-05-01 DIAGNOSIS — K5903 Drug induced constipation: Secondary | ICD-10-CM | POA: Diagnosis not present

## 2023-05-01 DIAGNOSIS — E88819 Insulin resistance, unspecified: Secondary | ICD-10-CM

## 2023-05-01 DIAGNOSIS — R632 Polyphagia: Secondary | ICD-10-CM

## 2023-05-01 DIAGNOSIS — E669 Obesity, unspecified: Secondary | ICD-10-CM

## 2023-05-01 DIAGNOSIS — Z6841 Body Mass Index (BMI) 40.0 and over, adult: Secondary | ICD-10-CM

## 2023-05-01 MED ORDER — WEGOVY 1.7 MG/0.75ML ~~LOC~~ SOAJ
1.7000 mg | SUBCUTANEOUS | 0 refills | Status: DC
Start: 2023-05-01 — End: 2023-06-05

## 2023-05-01 NOTE — Progress Notes (Signed)
.smr  Office: [phone]  /  Fax: [phone]  WEIGHT SUMMARY AND BIOMETRICS  Vitals Temp: 98.2 F (36.8 C) BP: 117/75 Pulse Rate: 70 SpO2: 97 %   Anthropometric Measurements Height: 5\' 4"  (1.626 m) Weight: 238 lb (108 kg) BMI (Calculated): 40.83 Weight at Last Visit: 242 lb Weight Lost Since Last Visit: 4 lb Weight Gained Since Last Visit: 0 Starting Weight: 259 lb Total Weight Loss (lbs): 21 lb (9.526 kg) Peak Weight: 270 lb   Body Composition  Body Fat %: 44.1 % Fat Mass (lbs): 105 lbs Muscle Mass (lbs): 126.2 lbs Total Body Water (lbs): 90.6 lbs Visceral Fat Rating : 13   Other Clinical Data Fasting: no Labs: no Today's Visit #: 11 Starting Date: 06/08/22     HPI  Chief Complaint: OBESITY  Brandi Ortiz is here to discuss her progress with her obesity treatment plan. She is on the the Category 3 Plan and states she is following her eating plan approximately 75 % of the time. She states she is exercising 30-40 minutes 4-5 times per week.   Interval History:  Since last office visit she is down 4 lbs.  Has strategy of doing fruit prep every Sunday with other family/friends to keep low glycemic fruits readily available  Hunger/appetite-well controlled. Eats on regular schedule to avoid nausea.  Cravings- not excessive Stress- manageable overall. Home more but will travel again for work and also going to conference in [place] .  Exercise-Increased Walking 1-2 miles 4-5 times weekly. Was helping to walk friend's dogs.  Hydration-needs in increase water intake. Discussed strategies including setting a timer on watch or phone to consistently drink.    Pharmacotherapy: Wegovy 1.7 mg weekly . Denies mass in neck, dysphagia, dyspepsia, persistent hoarseness, abdominal pain, or N/V/ or diarrhea. Has annual eye exam. Mood is stable. Some constipation, but better overall with daily miralax.    TREATMENT PLAN FOR OBESITY:  Recommended Dietary Goals  Brandi Ortiz is  currently in the action stage of change. As such, her goal is to continue weight management plan. She has agreed to the Category 3 Plan.  Behavioral Intervention  We discussed the following Behavioral Modification Strategies today: increasing lean protein intake, decreasing simple carbohydrates , increasing vegetables, increasing lower glycemic fruits, increasing fiber rich foods, avoiding skipping meals, increasing water intake, continue to practice mindfulness when eating, and planning for success.  Additional resources provided today: NA  Recommended Physical Activity Goals  Brandi Ortiz has been advised to work up to 150 minutes of moderate intensity aerobic activity a week and strengthening exercises 2-3 times per week for cardiovascular health, weight loss maintenance and preservation of muscle mass.   She has agreed to Continue current level of physical activity    Pharmacotherapy We discussed various medication options to help Brandi Ortiz with her weight loss efforts and we both agreed to continue Wegovy 1.7 mg weekly to promote weight loss.    Return in about 4 weeks (around 05/29/2023).Marland Kitchen She was informed of the importance of frequent follow up visits to maximize her success with intensive lifestyle modifications for her multiple health conditions.  PHYSICAL EXAM:  Blood pressure 117/75, pulse 70, temperature 98.2 F (36.8 C), height 5\' 4"  (1.626 m), weight 238 lb (108 kg), last menstrual period 03/23/2018, SpO2 97 %. Body mass index is 40.85 kg/m.  General: She is overweight, cooperative, alert, well developed, and in no acute distress. PSYCH: Has normal mood, affect and thought process.   Cardiovascular: HR 70's regular, BP 117/75 Lungs: Normal breathing  effort, no conversational dyspnea. Neuro: no focal deficits  DIAGNOSTIC DATA REVIEWED:  BMET    Component Value Date/Time   NA 142 03/05/2023 0924   K 4.2 03/05/2023 0924   CL 105 03/05/2023 0924   CO2 21 03/05/2023 0924    GLUCOSE 87 03/05/2023 0924   GLUCOSE 90 07/19/2021 1156   BUN 18 03/05/2023 0924   CREATININE 0.95 03/05/2023 0924   CALCIUM 8.9 03/05/2023 0924   Lab Results  Component Value Date   HGBA1C 4.5 (L) 03/05/2023   HGBA1C 4.5 (L) 04/15/2018   Lab Results  Component Value Date   INSULIN 41.6 (H) 03/05/2023   INSULIN 39.3 (H) 06/08/2022   Lab Results  Component Value Date   TSH 1.550 06/08/2022   CBC    Component Value Date/Time   WBC 6.4 03/05/2023 0924   WBC 5.8 07/19/2021 1156   RBC 5.11 03/05/2023 0924   RBC 4.93 07/19/2021 1156   HGB 13.9 03/05/2023 0924   HCT 43.3 03/05/2023 0924   PLT 246 03/05/2023 0924   MCV 85 03/05/2023 0924   MCH 27.2 03/05/2023 0924   MCHC 32.1 03/05/2023 0924   MCHC 32.1 07/19/2021 1156   RDW 14.7 03/05/2023 0924   Iron Studies    Component Value Date/Time   FERRITIN 141.7 07/05/2020 1640   Lipid Panel     Component Value Date/Time   CHOL 143 03/05/2023 0924   TRIG 79 03/05/2023 0924   HDL 53 03/05/2023 0924   CHOLHDL 3 07/19/2021 1156   VLDL 26.2 07/19/2021 1156   LDLCALC 75 03/05/2023 0924   Hepatic Function Panel     Component Value Date/Time   PROT 6.8 03/05/2023 0924   ALBUMIN 4.4 03/05/2023 0924   AST 21 03/05/2023 0924   ALT 28 03/05/2023 0924   ALKPHOS 153 (H) 03/05/2023 0924   BILITOT 0.8 03/05/2023 0924      Component Value Date/Time   TSH 1.550 06/08/2022 0943   Nutritional Lab Results  Component Value Date   VD25OH 30.9 03/05/2023   VD25OH 27.8 (L) 06/08/2022   VD25OH 29.84 (L) 07/19/2021    ASSOCIATED CONDITIONS ADDRESSED TODAY  ASSESSMENT AND PLAN  Problem List Items Addressed This Visit     Vitamin D deficiency   Insulin resistance - Primary   Polyphagia   Obesity (HCC)- Start BMI 41.8   Relevant Medications   Semaglutide-Weight Management (WEGOVY) 1.7 MG/0.75ML SOAJ   BMI 40.0-44.9, adult (HCC) Current BMI 43.4   Relevant Medications   Semaglutide-Weight Management (WEGOVY) 1.7 MG/0.75ML  SOAJ   Drug-induced constipation   Insulin Resistance Last fasting insulin was 41.6- not at goal and significantly elevated. A1c was 4.5- at goal. Polyphagia:Yes Medication(s): Wegovy 1.7 SQ weekly Denies mass in neck, dysphagia, dyspepsia, persistent hoarseness, abdominal pain, or N/V or diarrhea. Has annual eye exam. Mood is stable.  Constipation improving.  Lab Results  Component Value Date   HGBA1C 4.5 (L) 03/05/2023   HGBA1C 5.1 06/08/2022   HGBA1C 4.8 07/19/2021   HGBA1C 4.5 (L) 04/15/2018   Lab Results  Component Value Date   INSULIN 41.6 (H) 03/05/2023   INSULIN 39.3 (H) 06/08/2022    Plan: Continue and refill Wegovy 1.7 SQ weekly Continue working on nutrition plan to decrease simple carbohydrates, increase lean proteins and exercise to promote weight loss, improve glycemic control and insulin resistance and prevent progression to Type 2 diabetes.  Will need to recheck fasting levels in 3-4 months.    Polyphagia Currently this is moderately controlled.  Medication(s): Wegovy 1.7 SQ weekly.  Reported side effects: Constipation  Plan: Has done well on Wegovy. Current BMI 40.83 Down 21 lbs/TBW loss of 8.1 % since 06/08/22 Continue and refill Wegovy 1.7 SQ weekly Will consider increase to max dose at next visit.   Vitamin D Deficiency Vitamin D is not at goal of 50.  Most recent vitamin D level was 30.9- improved, but not at goal of 50-70. She is on OTC vitamin D3 1000 IU daily. Lab Results  Component Value Date   VD25OH 30.9 03/05/2023   VD25OH 27.8 (L) 06/08/2022   VD25OH 29.84 (L) 07/19/2021    Plan: Continue and increase dose OTC vitamin D3 2000 IU daily Recheck vitamin D level in 3-4 months to opitmize supplementation.  Low vitamin D levels can be associated with adiposity and may result in leptin resistance and weight gain. Also associated with fatigue. Currently on vitamin D supplementation without any adverse effects.    Constipation Chelly notes  constipation.   This is likely related to GLP-1.  She has been taking miralax daily. Constipation is moderately controlled.   Plan: Continue miralax once daily. Consider taking Psyllium husk capsules 2-4 capsules daily.  Increase fiber and water. Continue to monitor closely.    ATTESTASTION STATEMENTS:  Reviewed by clinician on day of visit: allergies, medications, problem list, medical history, surgical history, family history, social history, and previous encounter notes.   I have personally spent 45 minutes total time today in preparation, patient care, nutritional counseling and documentation for this visit, including the following: review of clinical lab tests; review of medical tests/procedures/services.      Ferrah Panagopoulos, PA-C

## 2023-06-04 NOTE — Progress Notes (Signed)
.smr  Office: [phone]  /  Fax: [phone]  WEIGHT SUMMARY AND BIOMETRICS  Vitals Temp: 97.8 F (36.6 C) BP: 108/73 Pulse Rate: 79 SpO2: 99 %   Anthropometric Measurements Height: 5\' 4"  (1.626 m) Weight: 235 lb (106.6 kg) BMI (Calculated): 40.32 Weight at Last Visit: 238lb Weight Lost Since Last Visit: 3lb Weight Gained Since Last Visit: 0 Starting Weight: 259lb Total Weight Loss (lbs): 24 lb (10.9 kg) Peak Weight: 270lb   Body Composition  Body Fat %: 43.6 % Fat Mass (lbs): 102.8 lbs Muscle Mass (lbs): 126.2 lbs Total Body Water (lbs): 89.8 lbs Visceral Fat Rating : 13   Other Clinical Data Fasting: no Labs: no Today's Visit #: 13 Starting Date: 06/08/22     HPI  Chief Complaint: OBESITY  Brandi Ortiz is here to discuss her progress with her obesity treatment plan. She is on the the Category 3 Plan and states she is following her eating plan approximately 40-50 % of the time. She states she is exercising walking 30 minutes 1 times per week.   Interval History:  Since last office visit she is down 3 lbs.   Hunger/appetite-moderate control Cravings- not excessive Stress- has been working with a work Psychologist, occupational recently Sleep- Using CPAP Exercise-working on increasing exercise/walking consistently which helps relieve constipation as well.  Hydration-Goal to increase to at least 80 oz daily to help with both constipation and nausea.  Feels she is close to her personal goal weight of 230 lbs.   Pharmacotherapy: Wegovy 1.7 mg weekly. Denies mass in neck, dysphagia, dyspepsia, persistent hoarseness, abdominal pain. Has annual eye exam. Mood is stable.   She has had 2 episodes of N/V/diarrhea recently and 1 subsequent episode of nausea which she was able to resolve . She attributes the episodes to possibly when she waits too long to eat or eats something with higher fat or simple carb . She noted the last episode of nausea only was improved by making sure she had  eaten and by hydrating well. She also noted she felt constipated when the episodes occurred and has been taking miralax at least once daily and drinking Senna tea and green tea which has helped relieve the constipation. Denies any episodes of abdominal pain.  TREATMENT PLAN FOR OBESITY: Down total of 24 lbs.  TBW loss of 9.3% over the past year.   Recommended Dietary Goals  Brandi Ortiz is currently in the action stage of change. As such, her goal is to continue weight management plan. She has agreed to the Category 3 Plan.  Behavioral Intervention  We discussed the following Behavioral Modification Strategies today: increasing lean protein intake, decreasing simple carbohydrates , increasing vegetables, increasing lower glycemic fruits, increasing fiber rich foods, avoiding skipping meals, increasing water intake, work on managing stress, creating time for self-care and relaxation measures, continue to practice mindfulness when eating, and planning for success.  Additional resources provided today: NA  Recommended Physical Activity Goals  Brandi Ortiz has been advised to work up to 150 minutes of moderate intensity aerobic activity a week and strengthening exercises 2-3 times per week for cardiovascular health, weight loss maintenance and preservation of muscle mass.   She has agreed to Continue current level of physical activity  and Increase physical activity in their day and reduce sedentary time (increase NEAT).   Pharmacotherapy We discussed various medication options to help Brandi Ortiz with her weight loss efforts and we both agreed to continue Wegovy at 1.7 mg weekly due to increase in nausea and constipation symptoms .  Return in about 4 weeks (around 07/03/2023).Marland Kitchen She was informed of the importance of frequent follow up visits to maximize her success with intensive lifestyle modifications for her multiple health conditions.  PHYSICAL EXAM:  Blood pressure 108/73, pulse 79,  temperature 97.8 F (36.6 C), height 5\' 4"  (1.626 m), weight 235 lb (106.6 kg), last menstrual period 03/23/2018, SpO2 99%. Body mass index is 40.34 kg/m.  General: She is overweight, cooperative, alert, well developed, and in no acute distress. PSYCH: Has normal mood, affect and thought process.   Cardiovascular: HR 70's BP 108/73 Lungs: Normal breathing effort, no conversational dyspnea. ABD: no abd tenderness, normal BS  DIAGNOSTIC DATA REVIEWED:  BMET    Component Value Date/Time   NA 142 03/05/2023 0924   K 4.2 03/05/2023 0924   CL 105 03/05/2023 0924   CO2 21 03/05/2023 0924   GLUCOSE 87 03/05/2023 0924   GLUCOSE 90 07/19/2021 1156   BUN 18 03/05/2023 0924   CREATININE 0.95 03/05/2023 0924   CALCIUM 8.9 03/05/2023 0924   Lab Results  Component Value Date   HGBA1C 4.5 (L) 03/05/2023   HGBA1C 4.5 (L) 04/15/2018   Lab Results  Component Value Date   INSULIN 41.6 (H) 03/05/2023   INSULIN 39.3 (H) 06/08/2022   Lab Results  Component Value Date   TSH 1.550 06/08/2022   CBC    Component Value Date/Time   WBC 6.4 03/05/2023 0924   WBC 5.8 07/19/2021 1156   RBC 5.11 03/05/2023 0924   RBC 4.93 07/19/2021 1156   HGB 13.9 03/05/2023 0924   HCT 43.3 03/05/2023 0924   PLT 246 03/05/2023 0924   MCV 85 03/05/2023 0924   MCH 27.2 03/05/2023 0924   MCHC 32.1 03/05/2023 0924   MCHC 32.1 07/19/2021 1156   RDW 14.7 03/05/2023 0924   Iron Studies    Component Value Date/Time   FERRITIN 141.7 07/05/2020 1640   Lipid Panel     Component Value Date/Time   CHOL 143 03/05/2023 0924   TRIG 79 03/05/2023 0924   HDL 53 03/05/2023 0924   CHOLHDL 3 07/19/2021 1156   VLDL 26.2 07/19/2021 1156   LDLCALC 75 03/05/2023 0924   Hepatic Function Panel     Component Value Date/Time   PROT 6.8 03/05/2023 0924   ALBUMIN 4.4 03/05/2023 0924   AST 21 03/05/2023 0924   ALT 28 03/05/2023 0924   ALKPHOS 153 (H) 03/05/2023 0924   BILITOT 0.8 03/05/2023 0924      Component  Value Date/Time   TSH 1.550 06/08/2022 0943   Nutritional Lab Results  Component Value Date   VD25OH 30.9 03/05/2023   VD25OH 27.8 (L) 06/08/2022   VD25OH 29.84 (L) 07/19/2021    ASSOCIATED CONDITIONS ADDRESSED TODAY  ASSESSMENT AND PLAN  Problem List Items Addressed This Visit     Insulin resistance - Primary   Polyphagia   Obesity (HCC)- Start BMI 41.8   Relevant Medications   Semaglutide-Weight Management (WEGOVY) 1.7 MG/0.75ML SOAJ   BMI 40.0-44.9, adult (HCC) Current BMI 43.4   Relevant Medications   Semaglutide-Weight Management (WEGOVY) 1.7 MG/0.75ML SOAJ   Drug-induced constipation  Insulin Resistance Last fasting insulin was 41.6- not at goal. A1c was 4.5- at goal. Polyphagia:No  Denies mass in neck, dysphagia, dyspepsia, persistent hoarseness, abdominal pain. Has annual eye exam. Mood is stable.   She has had 2 episodes of N/V/diarrhea recently and 1 subsequent episode of nausea which she was able to resolve . She attributes the episodes to possibly  when she waits too long to eat or eats something with higher fat or simple carb . She noted the last episode of nausea only was improved by making sure she had eaten and by hydrating well. She also noted she felt constipated when the episodes occurred and has been taking miralax at least once daily and drinking Senna tea and green tea which has helped relieve the constipation.  Medication(s): Wegovy 1.7 SQ weekly Lab Results  Component Value Date   HGBA1C 4.5 (L) 03/05/2023   HGBA1C 5.1 06/08/2022   HGBA1C 4.8 07/19/2021   HGBA1C 4.5 (L) 04/15/2018   Lab Results  Component Value Date   INSULIN 41.6 (H) 03/05/2023   INSULIN 39.3 (H) 06/08/2022    Plan: Continue and refill Wegovy 1.7 SQ weekly No increase clinically indicated at this time due to side effects. May want to consider changing to Zepbound. Continue working on nutrition plan to decrease simple carbohydrates, increase lean proteins and exercise to  promote weight loss, improve glycemic control/ insulin resistance and prevent progression to Type 2 diabetes.  Plan to recheck labs over the next 2 months.   Polyphagia Currently this is well controlled. Medication(s): Wegovy 1.7 SQ weekly.  Reported side effects: Constipation/ N/V/ D- see above   Plan: Continue and refill Wegovy 1.7 SQ weekly  Monitor closely for ongoing constipation, N/V/D.   Constipation Rhodia notes constipation.   This is likely related to [facility].   She has been taking miralax, Senna tea/green teas. Constipation is moderately controlled.   Plan: Increase fiber and water. Continue MiraLAX once daily.  May take twice a day or every other day based on results. Monitor closely. May need to decrease dosage of Wegovy or discontinue if persists or consider switching to Zepbound.     ATTESTASTION STATEMENTS:  Reviewed by clinician on day of visit: allergies, medications, problem list, medical history, surgical history, family history, social history, and previous encounter notes.   I have personally spent 38 minutes total time today in preparation, patient care, nutritional counseling and documentation for this visit, including the following: review of clinical lab tests; review of medical tests/procedures/services.      Eri Mcevers, PA-C

## 2023-06-05 ENCOUNTER — Encounter (INDEPENDENT_AMBULATORY_CARE_PROVIDER_SITE_OTHER): Payer: Self-pay | Admitting: Physician Assistant

## 2023-06-05 ENCOUNTER — Ambulatory Visit (INDEPENDENT_AMBULATORY_CARE_PROVIDER_SITE_OTHER): Payer: 59 | Admitting: Physician Assistant

## 2023-06-05 VITALS — BP 108/73 | HR 79 | Temp 97.8°F | Ht 64.0 in | Wt 235.0 lb

## 2023-06-05 DIAGNOSIS — R632 Polyphagia: Secondary | ICD-10-CM

## 2023-06-05 DIAGNOSIS — E669 Obesity, unspecified: Secondary | ICD-10-CM

## 2023-06-05 DIAGNOSIS — G4733 Obstructive sleep apnea (adult) (pediatric): Secondary | ICD-10-CM

## 2023-06-05 DIAGNOSIS — E88819 Insulin resistance, unspecified: Secondary | ICD-10-CM

## 2023-06-05 DIAGNOSIS — R5383 Other fatigue: Secondary | ICD-10-CM

## 2023-06-05 DIAGNOSIS — R7989 Other specified abnormal findings of blood chemistry: Secondary | ICD-10-CM

## 2023-06-05 DIAGNOSIS — K5903 Drug induced constipation: Secondary | ICD-10-CM | POA: Diagnosis not present

## 2023-06-05 DIAGNOSIS — E559 Vitamin D deficiency, unspecified: Secondary | ICD-10-CM

## 2023-06-05 DIAGNOSIS — Z6841 Body Mass Index (BMI) 40.0 and over, adult: Secondary | ICD-10-CM

## 2023-06-05 MED ORDER — WEGOVY 1.7 MG/0.75ML ~~LOC~~ SOAJ
1.7000 mg | SUBCUTANEOUS | 0 refills | Status: DC
Start: 2023-06-05 — End: 2023-07-03

## 2023-06-06 ENCOUNTER — Encounter: Payer: Self-pay | Admitting: Family Medicine

## 2023-07-03 ENCOUNTER — Encounter (INDEPENDENT_AMBULATORY_CARE_PROVIDER_SITE_OTHER): Payer: Self-pay | Admitting: Physician Assistant

## 2023-07-03 ENCOUNTER — Ambulatory Visit (INDEPENDENT_AMBULATORY_CARE_PROVIDER_SITE_OTHER): Payer: 59 | Admitting: Physician Assistant

## 2023-07-03 VITALS — BP 123/78 | HR 83 | Temp 97.4°F | Ht 66.0 in | Wt 230.0 lb

## 2023-07-03 DIAGNOSIS — R7989 Other specified abnormal findings of blood chemistry: Secondary | ICD-10-CM | POA: Diagnosis not present

## 2023-07-03 DIAGNOSIS — E669 Obesity, unspecified: Secondary | ICD-10-CM

## 2023-07-03 DIAGNOSIS — E88819 Insulin resistance, unspecified: Secondary | ICD-10-CM | POA: Diagnosis not present

## 2023-07-03 DIAGNOSIS — Z6837 Body mass index (BMI) 37.0-37.9, adult: Secondary | ICD-10-CM

## 2023-07-03 DIAGNOSIS — R632 Polyphagia: Secondary | ICD-10-CM | POA: Diagnosis not present

## 2023-07-03 DIAGNOSIS — F439 Reaction to severe stress, unspecified: Secondary | ICD-10-CM

## 2023-07-03 DIAGNOSIS — Z6836 Body mass index (BMI) 36.0-36.9, adult: Secondary | ICD-10-CM | POA: Insufficient documentation

## 2023-07-03 DIAGNOSIS — K5903 Drug induced constipation: Secondary | ICD-10-CM

## 2023-07-03 MED ORDER — WEGOVY 1.7 MG/0.75ML ~~LOC~~ SOAJ: 1.7000 mg | SUBCUTANEOUS | 0 refills | Status: DC

## 2023-07-03 NOTE — Progress Notes (Signed)
.smr  Office: [phone]  /  Fax: [phone]  WEIGHT SUMMARY AND BIOMETRICS  Vitals Temp: (!) 97.4 F (36.3 C) BP: 123/78 Pulse Rate: 83 SpO2: 100 %   Anthropometric Measurements Height: 5\' 6"  (1.676 m) Weight: 230 lb (104.3 kg) BMI (Calculated): 37.14 Weight at Last Visit: 235 lb Weight Lost Since Last Visit: 5 lb Weight Gained Since Last Visit: 0 Starting Weight: 259 lb Total Weight Loss (lbs): 29 lb (13.2 kg) Peak Weight: 270 lb   Body Composition  Body Fat %: 40.9 % Fat Mass (lbs): 94.2 lbs Muscle Mass (lbs): 129.2 lbs Total Body Water (lbs): 89.8 lbs Visceral Fat Rating : 11   Other Clinical Data Fasting: no Labs: no Today's Visit #: 14 Starting Date: 06/08/22     HPI  Chief Complaint: OBESITY  Brandi Ortiz is here to discuss her progress with her obesity treatment plan. She is on the the Category 3 Plan and states she is following her eating plan approximately 60 % of the time. She states she is exercising 30 minutes 2 times per week. Discussed the use of AI scribe software for clinical note transcription with the patient, who gave verbal consent to proceed.  History of Present Illness        Interval History:  Since last office visit she is down 5 lbs.     The patient, with a history of obesity, presents for a follow-up visit. She reports significant progress in her weight loss journey, having achieved her goal weight of 230 lbs. She attributes this success to her current medication regimen, Wegovy, and lifestyle modifications including a healthier diet and regular exercise. She has been working on Museum/gallery exhibitions officer and has seen a decrease in her adipose mass from 102.8 to 94.2. She has also noted a decrease in her visceral adipose rating from a high of 15 to 11, which is below the goal of 12.  Despite her progress, the patient has been dealing with some personal stressors at home. She lives with two other individuals who are also on weight loss  medications but are not adhering to the lifestyle changes necessary for successful weight loss. This has caused some tension and stress in the household, which the patient believes may be affecting her adherence to her treatment plan.  The patient has been proactive in managing her stress, engaging in regular walks and seeking companionship through a neighborhood walking group. She has also been diligent in maintaining her hydration, carrying around a water bottle and aiming for at least two bottles a day. Pharmacotherapy: Wegovy 1.7 mg weekly. Denies mass in neck, dysphagia, dyspepsia, persistent hoarseness, abdominal pain, or N/V/Constipation or diarrhea. Has annual eye exam. Mood is stable.    TREATMENT PLAN FOR OBESITY:  Recommended Dietary Goals  Brandi Ortiz is currently in the action stage of change. As such, her goal is to continue weight management plan. She has agreed to the Category 3 Plan.  Behavioral Intervention  We discussed the following Behavioral Modification Strategies today: increasing lean protein intake, decreasing simple carbohydrates , increasing vegetables, increasing lower glycemic fruits, increasing fiber rich foods, avoiding skipping meals, increasing water intake, emotional eating strategies and understanding the difference between hunger signals and cravings, work on managing stress, creating time for self-care and relaxation measures, continue to practice mindfulness when eating, and planning for success.  Additional resources provided today: NA  Recommended Physical Activity Goals  Brandi Ortiz has been advised to work up to 150 minutes of moderate intensity aerobic activity a week and  strengthening exercises 2-3 times per week for cardiovascular health, weight loss maintenance and preservation of muscle mass.   She has agreed to Continue current level of physical activity  and Increase physical activity in their day and reduce sedentary time (increase  NEAT).   Pharmacotherapy We discussed various medication options to help Brandi Ortiz with her weight loss efforts and we both agreed to continue Wegovy 1.7 mg weekly.    Return in about 4 weeks (around 07/31/2023).Marland Kitchen She was informed of the importance of frequent follow up visits to maximize her success with intensive lifestyle modifications for her multiple health conditions.  PHYSICAL EXAM:  Blood pressure 123/78, pulse 83, temperature (!) 97.4 F (36.3 C), height 5\' 6"  (1.676 m), weight 230 lb (104.3 kg), last menstrual period 03/23/2018, SpO2 100%. Body mass index is 37.12 kg/m.  General: She is overweight, cooperative, alert, well developed, and in no acute distress. PSYCH: Has normal mood, affect and thought process.   Cardiovascular: HR 80's BP 123/78 Lungs: Normal breathing effort, no conversational dyspnea. Neuro: no focal deficits  DIAGNOSTIC DATA REVIEWED:  BMET    Component Value Date/Time   NA 142 03/05/2023 0924   K 4.2 03/05/2023 0924   CL 105 03/05/2023 0924   CO2 21 03/05/2023 0924   GLUCOSE 87 03/05/2023 0924   GLUCOSE 90 07/19/2021 1156   BUN 18 03/05/2023 0924   CREATININE 0.95 03/05/2023 0924   CALCIUM 8.9 03/05/2023 0924   Lab Results  Component Value Date   HGBA1C 4.5 (L) 03/05/2023   HGBA1C 4.5 (L) 04/15/2018   Lab Results  Component Value Date   INSULIN 41.6 (H) 03/05/2023   INSULIN 39.3 (H) 06/08/2022   Lab Results  Component Value Date   TSH 1.550 06/08/2022   CBC    Component Value Date/Time   WBC 6.4 03/05/2023 0924   WBC 5.8 07/19/2021 1156   RBC 5.11 03/05/2023 0924   RBC 4.93 07/19/2021 1156   HGB 13.9 03/05/2023 0924   HCT 43.3 03/05/2023 0924   PLT 246 03/05/2023 0924   MCV 85 03/05/2023 0924   MCH 27.2 03/05/2023 0924   MCHC 32.1 03/05/2023 0924   MCHC 32.1 07/19/2021 1156   RDW 14.7 03/05/2023 0924   Iron Studies    Component Value Date/Time   FERRITIN 141.7 07/05/2020 1640   Lipid Panel     Component Value  Date/Time   CHOL 143 03/05/2023 0924   TRIG 79 03/05/2023 0924   HDL 53 03/05/2023 0924   CHOLHDL 3 07/19/2021 1156   VLDL 26.2 07/19/2021 1156   LDLCALC 75 03/05/2023 0924   Hepatic Function Panel     Component Value Date/Time   PROT 6.8 03/05/2023 0924   ALBUMIN 4.4 03/05/2023 0924   AST 21 03/05/2023 0924   ALT 28 03/05/2023 0924   ALKPHOS 153 (H) 03/05/2023 0924   BILITOT 0.8 03/05/2023 0924      Component Value Date/Time   TSH 1.550 06/08/2022 0943   Nutritional Lab Results  Component Value Date   VD25OH 30.9 03/05/2023   VD25OH 27.8 (L) 06/08/2022   VD25OH 29.84 (L) 07/19/2021    ASSOCIATED CONDITIONS ADDRESSED TODAY  ASSESSMENT AND PLAN  Problem List Items Addressed This Visit     Insulin resistance - Primary   Polyphagia   Obesity (HCC)- Start BMI 41.8   Relevant Medications   Semaglutide-Weight Management (WEGOVY) 1.7 MG/0.75ML SOAJ   Drug-induced constipation   BMI 37.0-37.9, adult Current BMI 37.2  Obesity/ Insulin resistance.  Labs  were reviewed today and discussed with the patient.  Insulin levels remain elevated significantly. A1C 4.5 - much improved.  Significant improvement in weight, visceral adipose rating, and BMI. Achieved goal weight of 230 lbs. Currently on Wegovy 1.7 mg with good tolerance and minimal side effects. Patient expressed interest in potentially tapering off medication in the future if progress continues. -Continue Wegovy 1.7 mg. -Consider increasing dose or extending dosing interval if insurance requires. -Plan for potential future tapering of medication with close monitoring for increased cravings.  Elevated Liver function Labs were reviewed today and discussed with the patient.  Improvement in liver function tests, likely secondary to weight loss and reduction in visceral adiposity. -Continue current weight loss plan to further improve liver function.  Stress Patient reports significant stress related to personal life and  home situation. -Encourage continued use of exercise as a stress management tool. -Consider referral to mental health services if stress continues to impact health and wellbeing.  ATTESTASTION STATEMENTS:  Reviewed by clinician on day of visit: allergies, medications, problem list, medical history, surgical history, family history, social history, and previous encounter notes.   I have personally spent 30 minutes total time today in preparation, patient care, nutritional counseling and documentation for this visit, including the following: review of clinical lab tests; review of medical tests/procedures/services.      Atonya Templer, PA-C

## 2023-07-04 ENCOUNTER — Ambulatory Visit (INDEPENDENT_AMBULATORY_CARE_PROVIDER_SITE_OTHER): Payer: 59 | Admitting: Physician Assistant

## 2023-07-07 DIAGNOSIS — F439 Reaction to severe stress, unspecified: Secondary | ICD-10-CM | POA: Insufficient documentation

## 2023-07-29 ENCOUNTER — Other Ambulatory Visit: Payer: Self-pay | Admitting: Family Medicine

## 2023-07-30 NOTE — Telephone Encounter (Signed)
LAST APPOINTMENT DATE: 11/01/22 left knee pain   NEXT APPOINTMENT DATE: 10/15/2023 toc with Matt     LAST REFILL: 04/23/23  QTY: #30 2 rf

## 2023-07-31 NOTE — Progress Notes (Unsigned)
.smr  Office: [phone]  /  Fax: [phone]  WEIGHT SUMMARY AND BIOMETRICS  Vitals Temp: 97.8 F (36.6 C) BP: 108/74 Pulse Rate: 64 SpO2: 99 %   Anthropometric Measurements Height: 5\' 6"  (1.676 m) Weight: 229 lb (103.9 kg) BMI (Calculated): 36.98 Weight at Last Visit: 230 lb Weight Lost Since Last Visit: 1 lb Weight Gained Since Last Visit: 0 Starting Weight: 259 lb Total Weight Loss (lbs): 30 lb (13.6 kg) Peak Weight: 270 lb   Body Composition  Body Fat %: 39.6 % Fat Mass (lbs): 90.8 lbs Muscle Mass (lbs): 131.4 lbs Total Body Water (lbs): 88.6 lbs Visceral Fat Rating : 11   Other Clinical Data Fasting: no Labs: no Today's Visit #: 15 Starting Date: 06/08/22     HPI  Chief Complaint: OBESITY  Brandi Ortiz is here to discuss her progress with her obesity treatment plan. She is on the the Category 3 Plan and states she is following her eating plan approximately 50 % of the time. She states she is exercising walk/run 30 minutes 2 times per week.   Interval History:  Since last office visit she is down 1 lb Bio impedence scale reviewed with the patient:  The patient, with a history of obesity and insulin resistance, presents for a follow-up visit regarding her obesity treatment plan. She has been on [facility] and reports no adverse effects such as nausea or vomiting. The patient has been managing her diet well, limiting her intake of fatty foods. She has been trying to stretch out the time between doses of Wegovyne, but she notices an increase in hunger when she does so. The patient has been engaging in regular exercise, including running, walking, lunges, and squats. Despite her busy schedule and life stress related to her living situation, she has been consistent with her exercise routine. The patient's weight loss goal was originally 230 lbs, but she is considering lowering it to 220 lbs. She also reports experiencing constipation.  Plan fasting labs next visit.    Pharmacotherapy: Wegovy 1.7 mg weekly. Denies mass in neck, dysphagia, dyspepsia, persistent hoarseness, abdominal pain, or N/V or diarrhea. Has annual eye exam. Mood is stable.  Some slow transit constipation mostly. Using miralax.   TREATMENT PLAN FOR OBESITY: Obesity Continued weight loss with XBMWUX 1.7mg  weekly. Tried to stretch dosing to about every 10 days but noted increased hunger/cravings with stretching dosing at this point. Discussed staying on every 7 days for now and monitoring over the next couple of months as continuing to make nice progress with adipose loss and maintaining muscle mass well. Patient tolerating well with no nausea or vomiting. Noted increased hunger when delaying the dose. Discussed the possibility of increasing the dose or extending the dosing interval in the future. -Continue Wegovy 1.7mg  weekly. -Reevaluate in one month for possible dose adjustment. Muscle Mass Patient has been successful in building muscle mass while losing adipose tissue. Engages in regular exercise including walking, running, lunges, and squats. -Continue current exercise regimen. Recommended Dietary Goals  Brandi Ortiz is currently in the action stage of change. As such, her goal is to continue weight management plan. She has agreed to the Category 3 Plan.  Behavioral Intervention  We discussed the following Behavioral Modification Strategies today: continue to work on maintaining a reduced calorie state, getting the recommended amount of protein, incorporating whole foods, making healthy choices, staying well hydrated and practicing mindfulness when eating..  Additional resources provided today: NA  Recommended Physical Activity Goals  Brandi Ortiz has been advised  to work up to 150 minutes of moderate intensity aerobic activity a week and strengthening exercises 2-3 times per week for cardiovascular health, weight loss maintenance and preservation of muscle mass.   She has agreed to  Continue current level of physical activity  and Increase physical activity in their day and reduce sedentary time (increase NEAT).   Pharmacotherapy We discussed various medication options to help Brandi Ortiz with her weight loss efforts and we both agreed to continue Wegovy 1.7 mg weekly for medical weight loss.    Return in about 4 weeks (around 08/29/2023) for Fasting Lab.Marland Kitchen She was informed of the importance of frequent follow up visits to maximize her success with intensive lifestyle modifications for her multiple health conditions.  PHYSICAL EXAM:  Blood pressure 108/74, pulse 64, temperature 97.8 F (36.6 C), height 5\' 6"  (1.676 m), weight 229 lb (103.9 kg), last menstrual period 03/23/2018, SpO2 99%. Body mass index is 36.96 kg/m.  General: She is overweight, cooperative, alert, well developed, and in no acute distress. PSYCH: Has normal mood, affect and thought process.  Cardiovascular: HR 60' s BP 108/74  Lungs: Normal breathing effort, no conversational dyspnea. Neuro: no focal deficit  DIAGNOSTIC DATA REVIEWED:  BMET    Component Value Date/Time   NA 142 03/05/2023 0924   K 4.2 03/05/2023 0924   CL 105 03/05/2023 0924   CO2 21 03/05/2023 0924   GLUCOSE 87 03/05/2023 0924   GLUCOSE 90 07/19/2021 1156   BUN 18 03/05/2023 0924   CREATININE 0.95 03/05/2023 0924   CALCIUM 8.9 03/05/2023 0924   Lab Results  Component Value Date   HGBA1C 4.5 (L) 03/05/2023   HGBA1C 4.5 (L) 04/15/2018   Lab Results  Component Value Date   INSULIN 41.6 (H) 03/05/2023   INSULIN 39.3 (H) 06/08/2022   Lab Results  Component Value Date   TSH 1.550 06/08/2022   CBC    Component Value Date/Time   WBC 6.4 03/05/2023 0924   WBC 5.8 07/19/2021 1156   RBC 5.11 03/05/2023 0924   RBC 4.93 07/19/2021 1156   HGB 13.9 03/05/2023 0924   HCT 43.3 03/05/2023 0924   PLT 246 03/05/2023 0924   MCV 85 03/05/2023 0924   MCH 27.2 03/05/2023 0924   MCHC 32.1 03/05/2023 0924   MCHC 32.1  07/19/2021 1156   RDW 14.7 03/05/2023 0924   Iron Studies    Component Value Date/Time   FERRITIN 141.7 07/05/2020 1640   Lipid Panel     Component Value Date/Time   CHOL 143 03/05/2023 0924   TRIG 79 03/05/2023 0924   HDL 53 03/05/2023 0924   CHOLHDL 3 07/19/2021 1156   VLDL 26.2 07/19/2021 1156   LDLCALC 75 03/05/2023 0924   Hepatic Function Panel     Component Value Date/Time   PROT 6.8 03/05/2023 0924   ALBUMIN 4.4 03/05/2023 0924   AST 21 03/05/2023 0924   ALT 28 03/05/2023 0924   ALKPHOS 153 (H) 03/05/2023 0924   BILITOT 0.8 03/05/2023 0924      Component Value Date/Time   TSH 1.550 06/08/2022 0943   Nutritional Lab Results  Component Value Date   VD25OH 30.9 03/05/2023   VD25OH 27.8 (L) 06/08/2022   VD25OH 29.84 (L) 07/19/2021    ASSOCIATED CONDITIONS ADDRESSED TODAY  ASSESSMENT AND PLAN  Problem List Items Addressed This Visit     Vitamin D deficiency   Insulin resistance - Primary   Obesity (HCC)- Start BMI 41.8   Relevant Medications   Semaglutide-Weight  Management (WEGOVY) 1.7 MG/0.75ML SOAJ   Drug-induced constipation   BMI 37.0-37.9, adult Current BMI 37.2   Insulin Resistance Last fasting insulin was 41.6- not at goal. A1c was 4.5 - at goal. Polyphagia:No Medication(s): Wegovy 1.7 SQ weekly Denies mass in neck, dysphagia, dyspepsia, persistent hoarseness, abdominal pain, or N/Vor diarrhea. Has annual eye exam. Mood is stable.  Slow transit constipation.  Lab Results  Component Value Date   HGBA1C 4.5 (L) 03/05/2023   HGBA1C 5.1 06/08/2022   HGBA1C 4.8 07/19/2021   HGBA1C 4.5 (L) 04/15/2018   Lab Results  Component Value Date   INSULIN 41.6 (H) 03/05/2023   INSULIN 39.3 (H) 06/08/2022    Plan: Continue and refill Wegovy 1.7 SQ weekly Continue working on nutrition plan to decrease simple carbohydrates, increase lean proteins and exercise to promote weight loss, improve glycemic control and prevent progression to Type 2 diabetes.   Recheck fasting labs next visit including insulin levels, CMET and A1C.  Vitamin D Deficiency Vitamin D is not at goal of 50.  Most recent vitamin D level was 30.9. She is on OTC vitamin D3 2000 IU daily. No N/V or muscle weakness on OTC vitamin D.  Lab Results  Component Value Date   VD25OH 30.9 03/05/2023   VD25OH 27.8 (L) 06/08/2022   VD25OH 29.84 (L) 07/19/2021    Plan: Continue OTC vitamin D3 2000 IU daily Low vitamin D levels can be associated with adiposity and may result in leptin resistance and weight gain. Also associated with fatigue. Currently on vitamin D supplementation without any adverse effects.  Recheck vitamin D level next visit to optimize supplementation/ avoid over supplementation.   Constipation Likely related to [facility]. Taking miralax, but slow transit and sometimes hard stools.  -Add over-the-counter Psyllium husk capsules, 2-4 daily. Continue miralax daily.  Hydrate well daily - At least 80 oz of fluids daily.  -Consider probiotic yogurt  like Pillars for additional fiber and probiotic cultures.    Follow-up -Schedule fasting labs in 4 weeks. -Next appointment in 4 weeks. ATTESTASTION STATEMENTS:  Reviewed by clinician on day of visit: allergies, medications, problem list, medical history, surgical history, family history, social history, and previous encounter notes.   I have personally spent 30 minutes total time today in preparation, patient care, nutritional counseling and documentation for this visit, including the following: review of clinical lab tests; review of medical tests/procedures/services.      Ayane Delancey, PA-C

## 2023-08-01 ENCOUNTER — Encounter (INDEPENDENT_AMBULATORY_CARE_PROVIDER_SITE_OTHER): Payer: Self-pay | Admitting: Physician Assistant

## 2023-08-01 ENCOUNTER — Ambulatory Visit (INDEPENDENT_AMBULATORY_CARE_PROVIDER_SITE_OTHER): Payer: 59 | Admitting: Physician Assistant

## 2023-08-01 VITALS — BP 108/74 | HR 64 | Temp 97.8°F | Ht 66.0 in | Wt 229.0 lb

## 2023-08-01 DIAGNOSIS — E88819 Insulin resistance, unspecified: Secondary | ICD-10-CM | POA: Diagnosis not present

## 2023-08-01 DIAGNOSIS — Z6837 Body mass index (BMI) 37.0-37.9, adult: Secondary | ICD-10-CM

## 2023-08-01 DIAGNOSIS — K5903 Drug induced constipation: Secondary | ICD-10-CM

## 2023-08-01 DIAGNOSIS — E559 Vitamin D deficiency, unspecified: Secondary | ICD-10-CM

## 2023-08-01 DIAGNOSIS — F439 Reaction to severe stress, unspecified: Secondary | ICD-10-CM

## 2023-08-01 DIAGNOSIS — E669 Obesity, unspecified: Secondary | ICD-10-CM | POA: Diagnosis not present

## 2023-08-01 DIAGNOSIS — K5900 Constipation, unspecified: Secondary | ICD-10-CM | POA: Diagnosis not present

## 2023-08-01 DIAGNOSIS — R632 Polyphagia: Secondary | ICD-10-CM

## 2023-08-01 MED ORDER — WEGOVY 1.7 MG/0.75ML ~~LOC~~ SOAJ: 1.7000 mg | SUBCUTANEOUS | 0 refills | Status: DC

## 2023-08-26 ENCOUNTER — Other Ambulatory Visit (INDEPENDENT_AMBULATORY_CARE_PROVIDER_SITE_OTHER): Payer: Self-pay | Admitting: Physician Assistant

## 2023-09-03 ENCOUNTER — Encounter (INDEPENDENT_AMBULATORY_CARE_PROVIDER_SITE_OTHER): Payer: Self-pay | Admitting: Physician Assistant

## 2023-09-04 ENCOUNTER — Encounter (INDEPENDENT_AMBULATORY_CARE_PROVIDER_SITE_OTHER): Payer: Self-pay | Admitting: Physician Assistant

## 2023-09-04 ENCOUNTER — Ambulatory Visit (INDEPENDENT_AMBULATORY_CARE_PROVIDER_SITE_OTHER): Payer: 59 | Admitting: Physician Assistant

## 2023-09-04 VITALS — BP 103/69 | HR 95 | Temp 98.5°F | Ht 66.0 in | Wt 230.0 lb

## 2023-09-04 DIAGNOSIS — K5903 Drug induced constipation: Secondary | ICD-10-CM

## 2023-09-04 DIAGNOSIS — R5383 Other fatigue: Secondary | ICD-10-CM

## 2023-09-04 DIAGNOSIS — E559 Vitamin D deficiency, unspecified: Secondary | ICD-10-CM | POA: Diagnosis not present

## 2023-09-04 DIAGNOSIS — Z6837 Body mass index (BMI) 37.0-37.9, adult: Secondary | ICD-10-CM

## 2023-09-04 DIAGNOSIS — E669 Obesity, unspecified: Secondary | ICD-10-CM

## 2023-09-04 DIAGNOSIS — R632 Polyphagia: Secondary | ICD-10-CM

## 2023-09-04 DIAGNOSIS — E88819 Insulin resistance, unspecified: Secondary | ICD-10-CM | POA: Diagnosis not present

## 2023-09-04 DIAGNOSIS — K760 Fatty (change of) liver, not elsewhere classified: Secondary | ICD-10-CM

## 2023-09-04 MED ORDER — WEGOVY 1.7 MG/0.75ML ~~LOC~~ SOAJ
1.7000 mg | SUBCUTANEOUS | 0 refills | Status: DC
Start: 2023-09-04 — End: 2023-10-04

## 2023-09-04 MED ORDER — TOPIRAMATE 25 MG PO TABS: 25.0000 mg | ORAL_TABLET | Freq: Every day | ORAL | 0 refills | Status: DC

## 2023-09-04 NOTE — Progress Notes (Signed)
SUBJECTIVE:  Chief Complaint: Obesity  Interim History: The patient, with a history of obesity, insulin resistance, vitamin D deficiency, and nonalcoholic fatty liver disease, presents for a follow-up visit. She has been on [facility] for her obesity. She reports persistent constipation and an increase in hunger. The hunger is described as "aggressive" and is particularly noticeable upon waking. The patient has been trying to manage her hunger by eating small amounts frequently and ensuring she consumes protein before exercising. She has also been trying to manage her constipation by increasing her fiber intake, including consuming two cups of blueberries daily and taking psyllium capsules. Despite these efforts, the patient reports that her system is slowing down and she is feeling "compacted."  Mattilyn is here to discuss her progress with her obesity treatment plan. She is on the Category 3 Plan and states she is following her eating plan approximately 75 % of the time. She states she is exercising run/walk 30 minutes 2-3 times per week.  Fasting labs obtained today.  She was informed we would discuss her lab results at her next visit unless there is a critical issue that needs to be addressed sooner. She agreed to keep her next visit at the agreed upon time to discuss these results.   OBJECTIVE: Visit Diagnoses: Problem List Items Addressed This Visit     Vitamin D deficiency   Relevant Orders   VITAMIN D 25 Hydroxy (Vit-D Deficiency, Fractures)   NAFLD (nonalcoholic fatty liver disease)   Other fatigue   Relevant Orders   Vitamin B12   Insulin resistance - Primary   Relevant Orders   CMP14+EGFR   Hemoglobin A1c   Insulin, random   Lipid Panel With LDL/HDL Ratio   Polyphagia   Obesity (HCC)- Start BMI 41.8   Relevant Medications   Semaglutide-Weight Management (WEGOVY) 1.7 MG/0.75ML SOAJ   Drug-induced constipation  Obesity with Insulin Resistance Noted increased hunger and  constipation while on Wegovy 1.7mg . Discussed the potential benefits of increasing Wegovy dose, but also the potential for worsening constipation. Also discussed adding Topiramate for additional appetite suppression and potential benefits for sleep. Patient denies history of glaucoma or kidney stones. She was informed of side effects and that topiramate is teratogenic. She is post menopausal.  -Start Topiramate 25mg  at bedtime, with potential to increase to 50mg  after one week if well-tolerated. -Continue Wegovy 1.7mg . -Check labs for lipid panel and Vitamin D levels.  Insulin Resistance Last fasting insulin was 41.6- not at goal. A1c was 4.5- at goal. Polyphagia:Yes Medication(s): Wegovy 1.7 SQ weekly Denies mass in neck, dysphagia, dyspepsia, persistent hoarseness, abdominal pain, or N/V/or diarrhea. Has annual eye exam. Mood is stable. Constipation has been an issue. Taking psyllium husk capsules and increased fluids. Also plans to start miralax more regularly.  She is working on a nutrition plan to decrease simple carbohydrates, increase lean proteins and exercise to promote weight loss, improve glycemic control and prevent progression to Type 2 diabetes.    Lab Results  Component Value Date   HGBA1C 4.5 (L) 03/05/2023   HGBA1C 5.1 06/08/2022   HGBA1C 4.8 07/19/2021   HGBA1C 4.5 (L) 04/15/2018   Lab Results  Component Value Date   INSULIN 41.6 (H) 03/05/2023   INSULIN 39.3 (H) 06/08/2022    Plan: Continue and refill Wegovy 1.7 SQ weekly Continue working on nutrition plan to decrease simple carbohydrates, increase lean proteins and exercise to promote weight loss, improve glycemic control and prevent progression to Type 2 diabetes.  Recheck fasting  labs today- Insulin, A1c and CMET.   Polyphagia Currently this is moderately controlled. Medication(s): Wegovy 1.7 SQ weekly.  Reported side effects: Constipation Patient denies history of glaucoma or kidney stones. She was informed  of side effects and that topiramate is teratogenic. She is menopausal.   Plan: Start Topiramate 25 mg nightly and increase to 50 mg nightly after 1 week.  Monitor response to topamax and titrate medication based on response.   Vitamin D Deficiency Vitamin D is not at goal of 50.  Most recent vitamin D level was 30.9.  Lab Results  Component Value Date   VD25OH 30.9 03/05/2023   VD25OH 27.8 (L) 06/08/2022   VD25OH 29.84 (L) 07/19/2021    Plan:  Recheck vitamin D level today.  Low vitamin D levels can be associated with adiposity and may result in leptin resistance and weight gain. Also associated with fatigue. Currently on vitamin D supplementation without any adverse effects.     Constipation Ongoing issue, exacerbated by [facility]. Patient has been using psyllium capsules and dietary changes (increased fiber intake) with limited success. Discussed potential addition of prescription medications like Linzess or Amitiza, but decided to try over-the-counter options first. -Start daily Miralax, with potential to decrease to half-dose if bowel movements become too frequent. -Continue dietary modifications (increased fiber intake).  MAFLD/NAFLD:  Alkaline Phosphatase remained elevated on last lab 03/05/23 at 153.  Will recheck liver function today.  Plan:  Intensive lifestyle modifications are the first line treatment for this issue.  We discussed several lifestyle modifications today and she will continue to work on diet, exercise and weight loss efforts and continue Wegovy .   Counseling: NAFLD is an umbrella term that encompasses a disease spectrum that includes steatosis (fat) without inflammation, steatohepatitis (NASH; fat + inflammation in a characteristic pattern), and cirrhosis. Bland steatosis is felt to be a benign condition, with extremely low to no risk of progression to cirrhosis, whereas NASH can progress to cirrhosis. The mainstay of treatment of NAFLD includes lifestyle  modification to achieve weight loss, at least 7% of current body weight. Low carbohydrate diets can be beneficial in improving NAFLD liver histology. Additionally, exercise, even the absence of weight loss can have beneficial effects on the patient's metabolic profile and liver health. We recommend that their metabolic comorbidities be aggressively managed, as patients with NAFLD are at increased risk of coronary artery disease.  Fatigue:  History of B 12 deficiency in past. Levels have been improving with nutrition changes.  Recheck B 12 levels today.      Follow-up in 4 weeks to assess response to changes in treatment plan and lab results.  Vitals Temp: 98.5 F (36.9 C) BP: 103/69 Pulse Rate: 95 SpO2: 97 %   Anthropometric Measurements Height: 5\' 6"  (1.676 m) Weight: 230 lb (104.3 kg) BMI (Calculated): 37.14 Weight at Last Visit: 229 lb Weight Lost Since Last Visit: 0 Weight Gained Since Last Visit: 1 lb Starting Weight: 259 lb Total Weight Loss (lbs): 29 lb (13.2 kg) Peak Weight: 270 lb   Body Composition  Body Fat %: 39.6 % Fat Mass (lbs): 91 lbs Muscle Mass (lbs): 132 lbs Total Body Water (lbs): 90.4 lbs Visceral Fat Rating : 11   Other Clinical Data Fasting: yes Labs: yes Today's Visit #: 16 Starting Date: 06/08/22     ASSESSMENT AND PLAN:  Diet: Tomie is currently in the action stage of change. As such, her goal is to continue with weight loss efforts. She has agreed to  Category 3 Plan.  Exercise: Jamisen has been instructed to continue exercising as is for weight loss and overall health benefits.   Behavior Modification:  We discussed the following Behavioral Modification Strategies today: increasing lean protein intake, decreasing simple carbohydrates, increasing vegetables, increase H2O intake, increase high fiber foods, no skipping meals, and holiday eating strategies. We discussed various medication options to help Athziri with her weight  loss efforts and we both agreed to continue Wegovy 1.7 mg weekly and start topamax 25 mg nightly and increase to 50 mg nightly after 1 week for medical weight loss.  Return in about 4 weeks (around 10/02/2023).Marland Kitchen She was informed of the importance of frequent follow up visits to maximize her success with intensive lifestyle modifications for her multiple health conditions.  Attestation Statements:   Reviewed by clinician on day of visit: allergies, medications, problem list, medical history, surgical history, family history, social history, and previous encounter notes.   Time spent on visit including pre-visit chart review and post-visit care and charting was 45 minutes.    Tannie Koskela, PA-C

## 2023-09-05 LAB — CMP14+EGFR
ALT: 56 [IU]/L — ABNORMAL HIGH (ref 0–32)
AST: 34 [IU]/L (ref 0–40)
Albumin: 4.1 g/dL (ref 3.9–4.9)
Alkaline Phosphatase: 168 [IU]/L — ABNORMAL HIGH (ref 44–121)
BUN/Creatinine Ratio: 18 (ref 9–23)
BUN: 19 mg/dL (ref 6–24)
Bilirubin Total: 0.8 mg/dL (ref 0.0–1.2)
CO2: 21 mmol/L (ref 20–29)
Calcium: 8.9 mg/dL (ref 8.7–10.2)
Chloride: 103 mmol/L (ref 96–106)
Creatinine, Ser: 1.03 mg/dL — ABNORMAL HIGH (ref 0.57–1.00)
Globulin, Total: 2.7 g/dL (ref 1.5–4.5)
Glucose: 73 mg/dL (ref 70–99)
Potassium: 4.3 mmol/L (ref 3.5–5.2)
Sodium: 141 mmol/L (ref 134–144)
Total Protein: 6.8 g/dL (ref 6.0–8.5)
eGFR: 66 mL/min/{1.73_m2} (ref >59)

## 2023-09-05 LAB — LIPID PANEL WITH LDL/HDL RATIO
Cholesterol, Total: 148 mg/dL (ref 100–199)
HDL: 53 mg/dL (ref >39)
LDL Chol Calc (NIH): 79 mg/dL (ref 0–99)
LDL/HDL Ratio: 1.5 ratio (ref 0.0–3.2)
Triglycerides: 86 mg/dL (ref 0–149)
VLDL Cholesterol Cal: 16 mg/dL (ref 5–40)

## 2023-09-05 LAB — INSULIN, RANDOM: INSULIN: 17.4 u[IU]/mL (ref 2.6–24.9)

## 2023-09-05 LAB — VITAMIN D 25 HYDROXY (VIT D DEFICIENCY, FRACTURES): Vit D, 25-Hydroxy: 35.1 ng/mL (ref 30.0–100.0)

## 2023-09-05 LAB — VITAMIN B12: Vitamin B-12: 1214 pg/mL (ref 232–1245)

## 2023-09-05 LAB — HEMOGLOBIN A1C
Est. average glucose Bld gHb Est-mCnc: 85 mg/dL
Hgb A1c MFr Bld: 4.6 % — ABNORMAL LOW (ref 4.8–5.6)

## 2023-09-25 IMAGING — MG MM DIGITAL SCREENING BILAT W/ TOMO AND CAD
6 of 11 series · 6 of 31 positions shown · non-contrast
Comparison: Previous exam(s).

CLINICAL DATA: Screening.

EXAM:
DIGITAL SCREENING BILATERAL MAMMOGRAM WITH TOMOSYNTHESIS AND CAD
TECHNIQUE: Bilateral screening digital craniocaudal and mediolateral oblique
mammograms were obtained. Bilateral screening digital breast
tomosynthesis was performed. The images were evaluated with
computer-aided detection.

[R MLO synth-2D (1 of 2)]
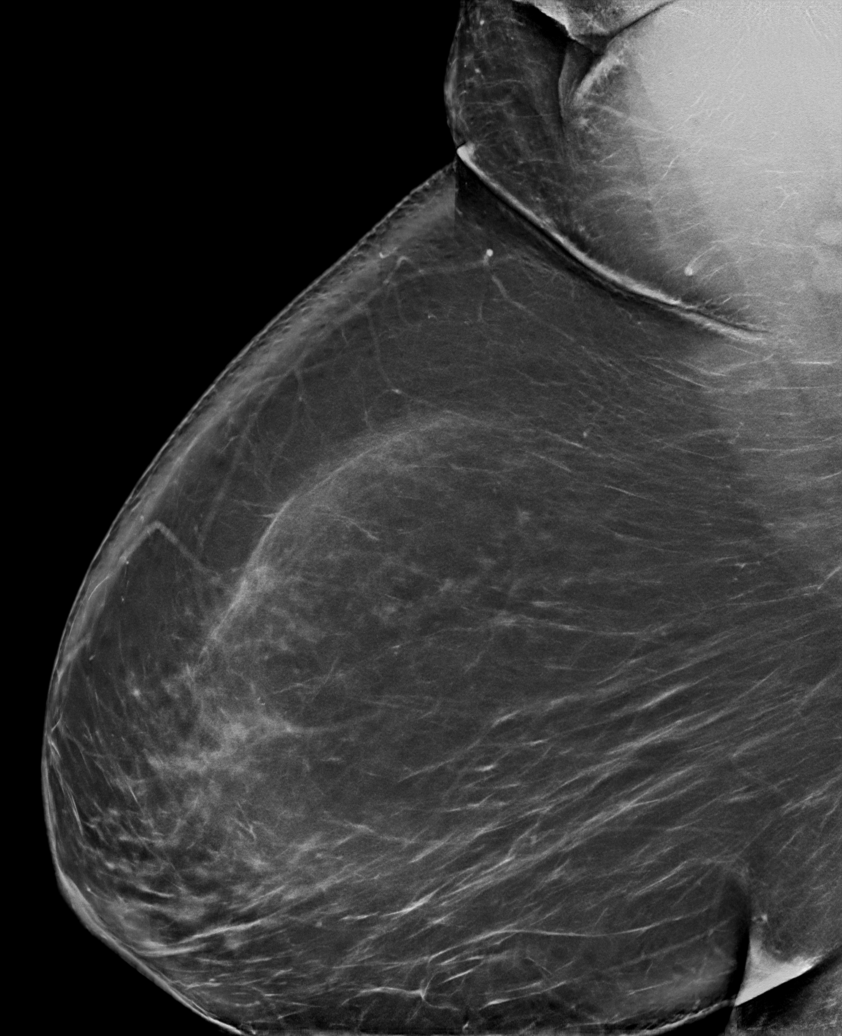

[L XCCL synth-2D]
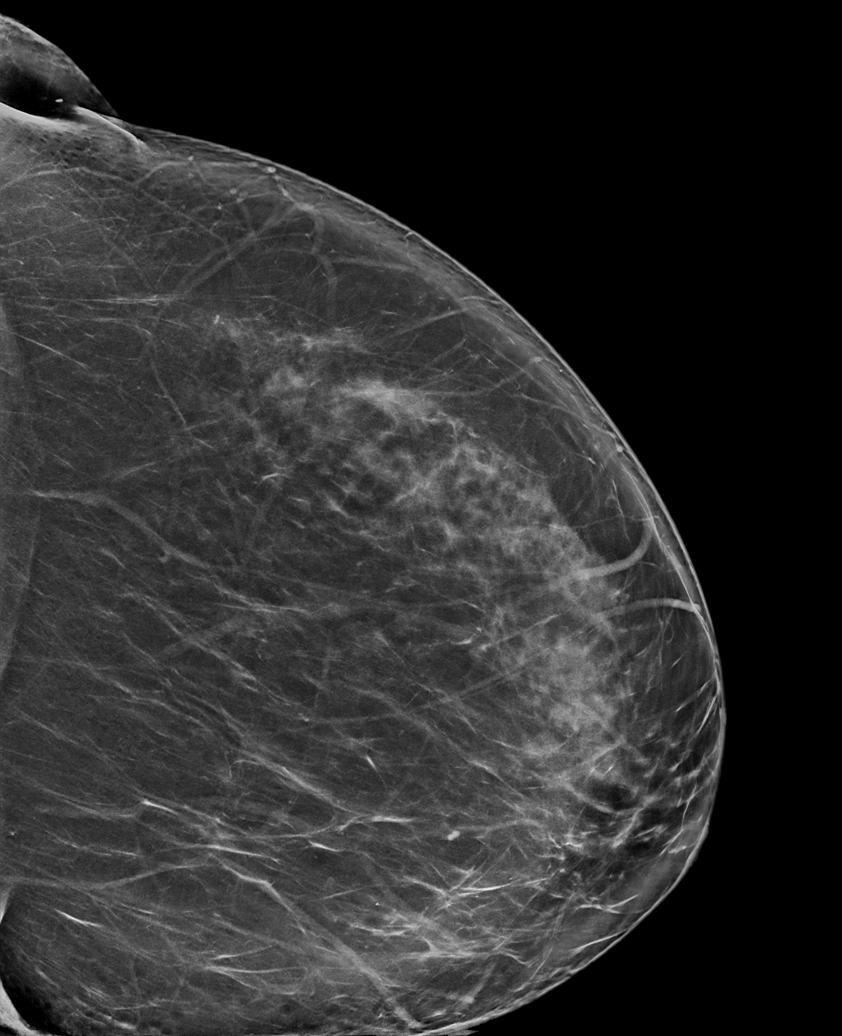

[L MLO synth-2D]
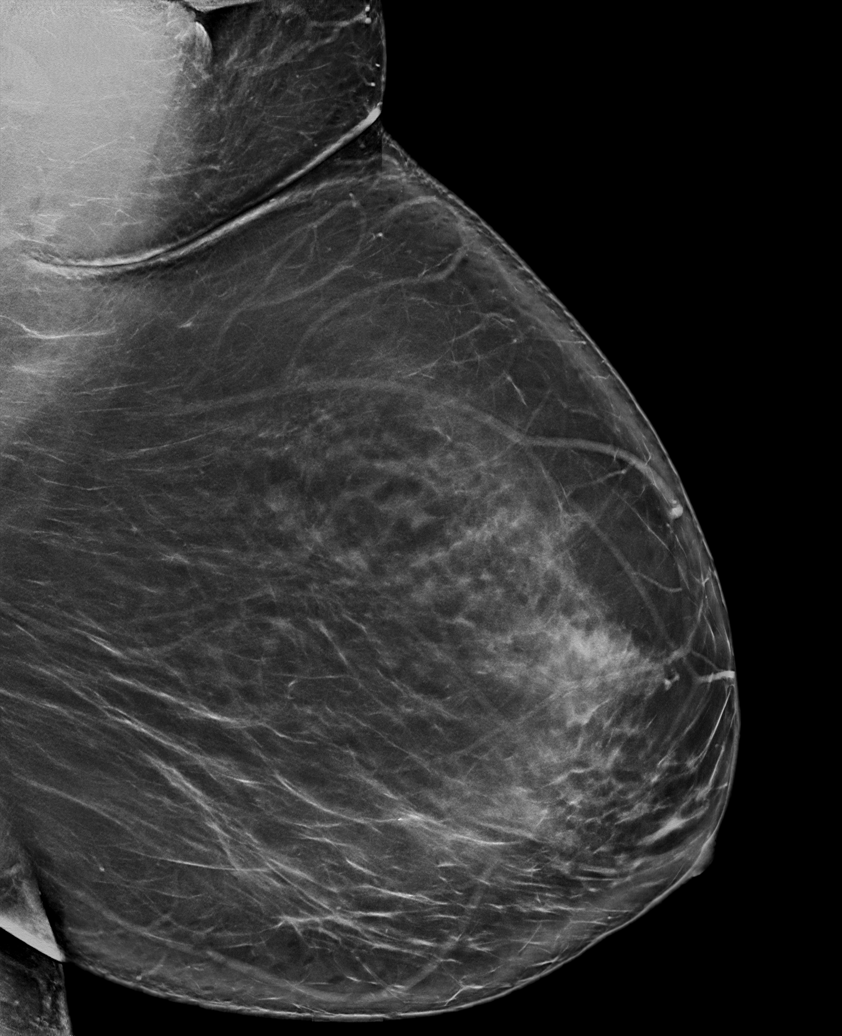

[R MLO synth-2D (2 of 2)]
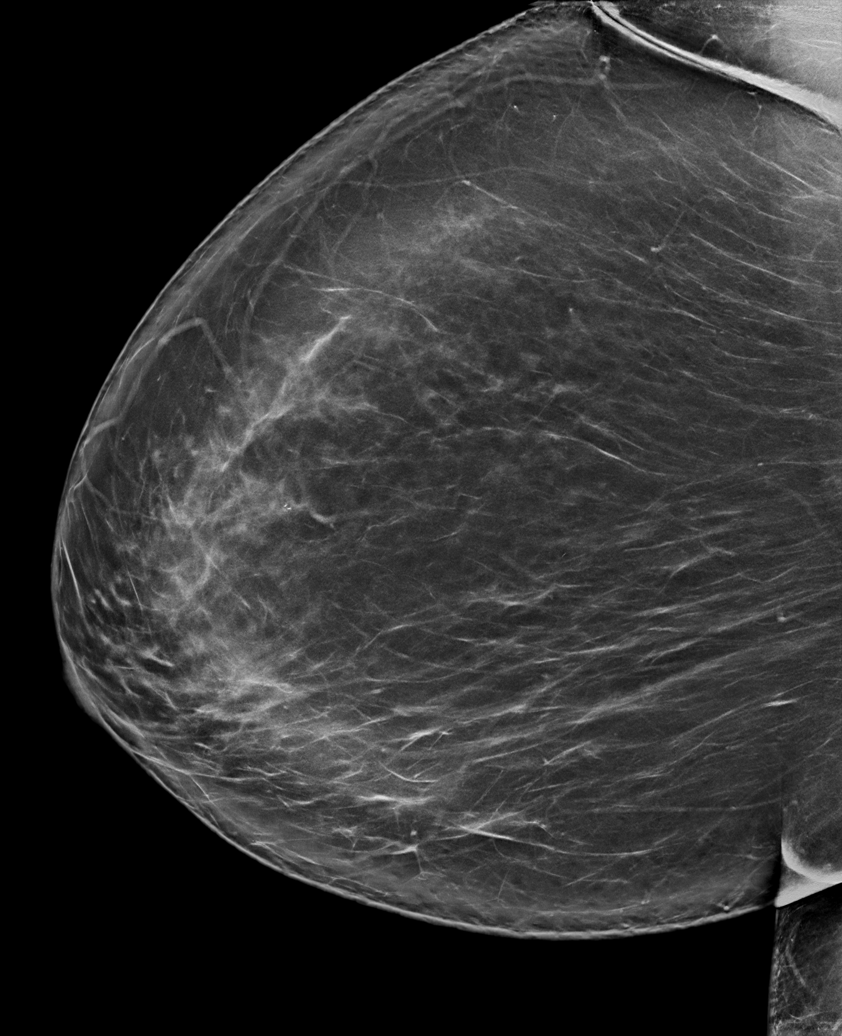

[R CC synth-2D]
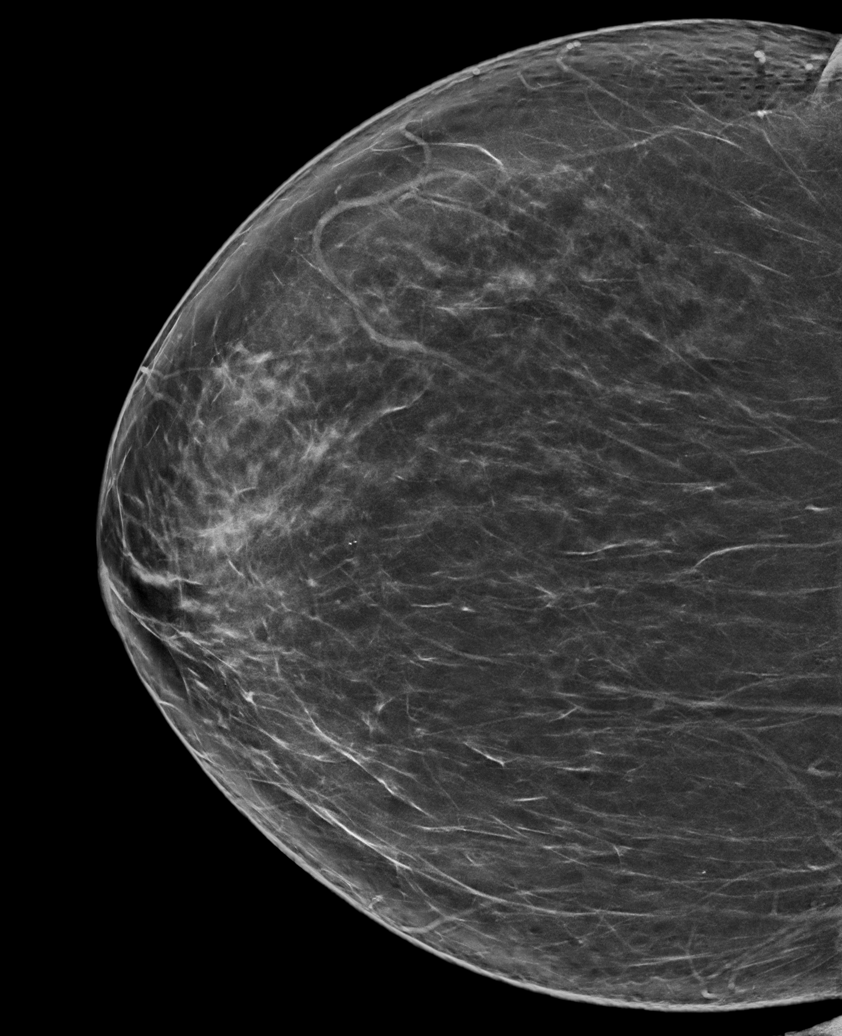

[L CC synth-2D]
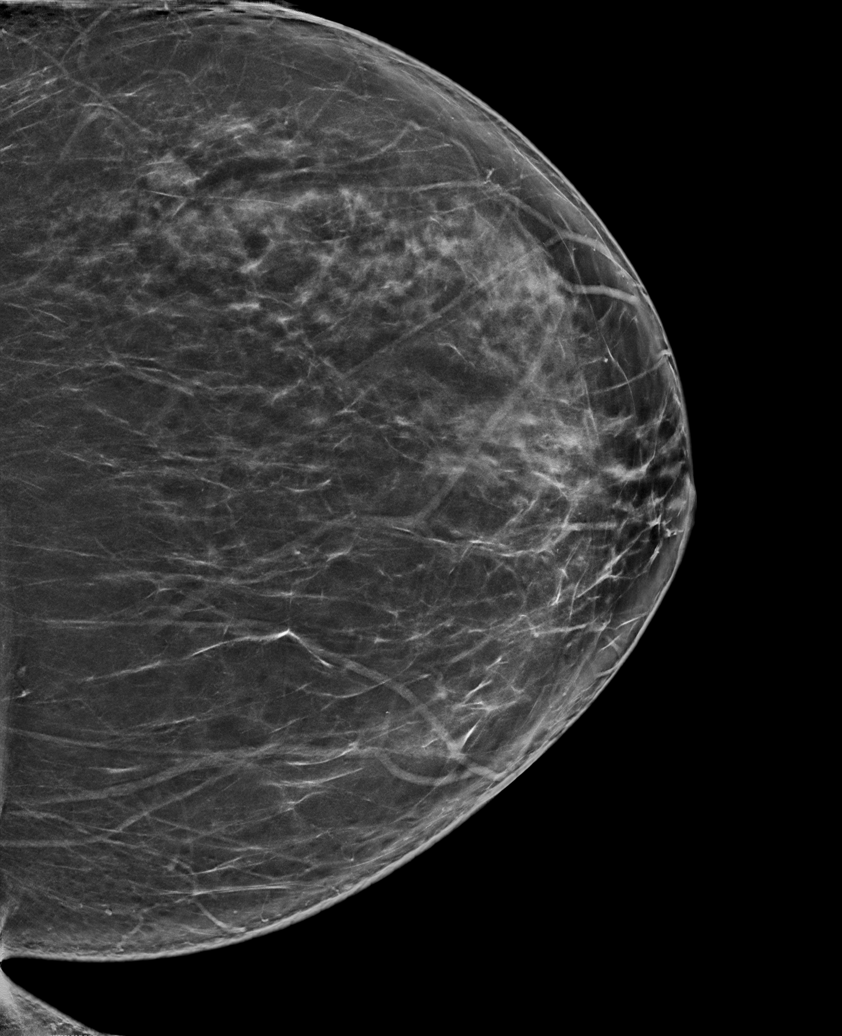

[6 of 31 positions shown; findings below may reference images not displayed]

ACR Breast Density Category b: There are scattered areas of
fibroglandular density.
FINDINGS: In the left breast, a possible mass in the posterior outer breast in
both projections warrants further evaluation. In the right breast,
no findings suspicious for malignancy.
IMPRESSION: Further evaluation is suggested for possible mass in the left
breast.

RECOMMENDATION:
Ultrasound of the left breast. (Code:[id number]-GGF)

The patient will be contacted regarding the findings, and additional
imaging will be scheduled.

BI-RADS CATEGORY  0: Incomplete. Need additional imaging evaluation
and/or prior mammograms for comparison.

## 2023-09-26 ENCOUNTER — Other Ambulatory Visit (INDEPENDENT_AMBULATORY_CARE_PROVIDER_SITE_OTHER): Payer: Self-pay | Admitting: Physician Assistant

## 2023-10-04 ENCOUNTER — Encounter (INDEPENDENT_AMBULATORY_CARE_PROVIDER_SITE_OTHER): Payer: Self-pay | Admitting: Physician Assistant

## 2023-10-04 ENCOUNTER — Ambulatory Visit (INDEPENDENT_AMBULATORY_CARE_PROVIDER_SITE_OTHER): Payer: 59 | Admitting: Physician Assistant

## 2023-10-04 VITALS — BP 112/74 | HR 72 | Temp 98.2°F | Ht 66.0 in | Wt 224.0 lb

## 2023-10-04 DIAGNOSIS — Z6836 Body mass index (BMI) 36.0-36.9, adult: Secondary | ICD-10-CM

## 2023-10-04 DIAGNOSIS — K5903 Drug induced constipation: Secondary | ICD-10-CM

## 2023-10-04 DIAGNOSIS — R1084 Generalized abdominal pain: Secondary | ICD-10-CM

## 2023-10-04 DIAGNOSIS — E88819 Insulin resistance, unspecified: Secondary | ICD-10-CM

## 2023-10-04 DIAGNOSIS — K760 Fatty (change of) liver, not elsewhere classified: Secondary | ICD-10-CM | POA: Diagnosis not present

## 2023-10-04 DIAGNOSIS — E669 Obesity, unspecified: Secondary | ICD-10-CM

## 2023-10-04 MED ORDER — WEGOVY 1.7 MG/0.75ML ~~LOC~~ SOAJ: 1.7000 mg | SUBCUTANEOUS | 0 refills | Status: DC

## 2023-10-04 NOTE — Progress Notes (Signed)
SUBJECTIVE:  Chief Complaint: Obesity  Interim History: She is down 6 lbs since her last visit.  Down 35 lbs overall TBW loss of 13.51% Brandi Ortiz Ortiz, a 52 year old individual with a history of insulin resistance, vitamin D deficiency, and metabolically associated fatty liver disease, presents for a follow-up visit regarding her obesity treatment plan. She has been on Wegovy 1.7 mg weekly. She reports significant weight loss over the past 4 weeks, which she attributes to dietary changes and meal prepping. She has notably reduced her beef and pork intake, focusing more on chicken, [place], fish, and vegetables.  She expresses concern about her liver function, which was prompted by recent lab results showing elevated liver enzymes. This has led her to research potential causes and make dietary adjustments, specifically reducing her beef intake. She also expresses a desire for imaging of her liver and gallbladder, as she recalls previous imaging years ago that mentioned something about her gallbladder.  In addition to her liver concerns, she reports ongoing issues with constipation. She has tried various remedies, including Miralax and psyllium husk, but with limited success. She has found some relief with magnesium citrate, but this is more of a recovery measure after episodes of severe constipation rather than a preventative strategy. She expresses a desire for a more regular and predictable bowel regimen. She has recently started a prebiotic and probiotic regimen, hoping this will help regulate her bowel movements.  She also mentions occasional leg cramps, which occurred most recently after taking magnesium citrate. She is unsure if there is a connection between the two. She is not currently taking topiramate, a medication previously prescribed for cravings as was not felt to be beneficial.  Brandi Ortiz is here to discuss her progress with her obesity treatment plan. She is on the Category 3 Plan  and states she is following her eating plan approximately 90 % of the time. She states she is not exercising 0 minutes 0 times per week.   OBJECTIVE: Visit Diagnoses: Problem List Items Addressed This Visit     NAFLD (nonalcoholic fatty liver disease) - Primary   Relevant Orders   CT ABDOMEN PELVIS W CONTRAST   Insulin resistance   Obesity (HCC)- Start BMI 41.8   Relevant Medications   Semaglutide-Weight Management (WEGOVY) 1.7 MG/0.75ML SOAJ   Drug-induced constipation   BMI 36.0-36.9,adult   Generalized abdominal pain   Relevant Orders   CT ABDOMEN PELVIS W CONTRAST  Obesity 52 year old with obesity, managed with Wegovy 1.7 mg weekly, achieving significant weight loss. Adhering to a meal plan, avoiding beef, incorporating chicken, [place], and fish, meal prepping, and maintaining a consistent breakfast routine. - Continue Wegovy 1.7 mg weekly - Encourage continued adherence to meal plan and avoidance of beef - Continue meal prepping and maintaining a consistent breakfast routine  Metabolically Associated Fatty Liver Disease (MAFLD=NAFLD) Elevated liver enzymes despite GLP-1 medication. Episodic right upper quadrant pain possibly related to liver or gallbladder. Discussed imaging options, with CT scan being more comprehensive than ultrasound. Previously had [place] of ABD as part of work up in 2020 and reports no concerning findings at that time. Had EGD/colonoscopy then as well - Notes from last visit with GI 04/18/2019: Apart from minimal gastritis upper endoscopy in February 2020 was normal. There was no evidence of reflux esophagitis, Barrett's esophagus, peptic ulcer disease, gastric cancer or early gastric cancer. Biopsies of the esophagogastric junction body and antrum of the stomach were unremarkable. There was no evidence of microscopic reflux esophagitis, gastritis or H. pylori  infection. Following her upper endoscopy she was advised about antireflux measures and prescribed famotidine  20 mg p.o. twice daily.   As a part of her evaluation she had an ultrasound scan of her abdomen which was notable for hepatic steatosis and a coincidental 1.3 cm gallstone. Also her alkaline phosphatase and ALT were both elevated. The patient was advised to diet exercise and lose weight.   On 12/25/2018 she underwent a colonoscopy for history of constipation which has improved considerably. She has melanosis coli but that was no evidence of colorectal polyps or cancer. She was advised to to take a high-fiber diet and was prescribed MiraLAX 17 g in 8 ounces of any liquid daily or twice daily.   At this point, do not feel follow up US would be helpful.  She reports intermittent episodes of GI distress/constipation which has been an ongoing issue even prior to initiation of GLP-1 therapy. We discussed other imaging like CT scan for further evaluation as well . If any concerns for NASH or other concerning findings, will plan referral to GI.  - Order ABD/Pelvic CT scan to evaluate liver, gallbladder, and pancreas - Repeat liver function tests at the beginning of the year  Constipation Ongoing constipation not managed with Miralax or psyllium husk. Magnesium citrate effective but seeking consistent solution. Started prebiotic and probiotic. Discussed Natural Calm magnesium supplement to regulate bowel movements and avoid diarrhea. - Recommend Natural Calm magnesium supplement at bedtime - Advise reducing Miralax if using Natural Calm - Encourage continued use of prebiotic and probiotic - Monitor bowel movements and adjust magnesium dosage as needed  Insulin Resistance Well-controlled insulin resistance with recent low A1c. - Continue current management and monitor A1c levels regularly  Vitamin D Deficiency Ongoing vitamin D deficiency. - Continue current vitamin D supplementation and monitor levels as needed  Follow-up - Follow up in 4 weeks - Send a message regarding imaging recommendations  after consulting with another doctor.  Vitals Temp: 98.2 F (36.8 C) BP: 112/74 Pulse Rate: 72 SpO2: 99 %   Anthropometric Measurements Height: 5\' 6"  (1.676 m) Weight: 224 lb (101.6 kg) BMI (Calculated): 36.17 Weight at Last Visit: 230 lb Weight Lost Since Last Visit: 6 lb Weight Gained Since Last Visit: 0 Starting Weight: 259 lb Total Weight Loss (lbs): 35 lb (15.9 kg) Peak Weight: 270 lb   Body Composition  Body Fat %: 38 % Fat Mass (lbs): 85.4 lbs Muscle Mass (lbs): 132.4 lbs Total Body Water (lbs): 87.4 lbs Visceral Fat Rating : 10   Other Clinical Data Fasting: no Labs: no Today's Visit #: 17 Starting Date: 06/08/22     ASSESSMENT AND PLAN:  Diet: Brandi Ortiz Ortiz is currently in the action stage of change. As such, her goal is to continue with weight loss efforts. She has agreed to Category 3 Plan.  Exercise: Brandi Ortiz has been instructed to work up to a goal of 150 minutes of combined cardio and strengthening exercise per week for weight loss and overall health benefits.   Behavior Modification:  We discussed the following Behavioral Modification Strategies today: increasing lean protein intake, decreasing simple carbohydrates, increasing vegetables, increase H2O intake, increase high fiber foods, no skipping meals, holiday eating strategies, and planning for success. We discussed various medication options to help Brandi Ortiz with her weight loss efforts and we both agreed to continue Wegovy 1.7 mg weekly for medical weight loss.   Return in about 4 weeks (around 11/01/2023).Marland Kitchen She was informed of the importance of frequent follow up visits  to maximize her success with intensive lifestyle modifications for her multiple health conditions.  Attestation Statements:   Reviewed by clinician on day of visit: allergies, medications, problem list, medical history, surgical history, family history, social history, and previous encounter notes.   Time spent on visit including  pre-visit chart review and post-visit care and charting was 53 minutes.    Nabil Bubolz, PA-C

## 2023-10-08 ENCOUNTER — Encounter (INDEPENDENT_AMBULATORY_CARE_PROVIDER_SITE_OTHER): Payer: Self-pay | Admitting: Physician Assistant

## 2023-10-08 ENCOUNTER — Telehealth (INDEPENDENT_AMBULATORY_CARE_PROVIDER_SITE_OTHER): Payer: Self-pay

## 2023-10-08 NOTE — Telephone Encounter (Signed)
Prior Auth Started  Key: [id number]

## 2023-10-08 NOTE — Telephone Encounter (Signed)
Auth Approved Reginal Lutes   [id number];Review Type:Prior Auth;Coverage Start Date:09/08/2023;Coverage End Date:10/07/2024;

## 2023-10-10 ENCOUNTER — Other Ambulatory Visit (HOSPITAL_BASED_OUTPATIENT_CLINIC_OR_DEPARTMENT_OTHER): Payer: Self-pay | Admitting: Family Medicine

## 2023-10-10 DIAGNOSIS — Z1231 Encounter for screening mammogram for malignant neoplasm of breast: Secondary | ICD-10-CM

## 2023-10-14 ENCOUNTER — Ambulatory Visit (HOSPITAL_BASED_OUTPATIENT_CLINIC_OR_DEPARTMENT_OTHER)
Admission: RE | Admit: 2023-10-14 | Discharge: 2023-10-14 | Disposition: A | Discharge status: Home / Self Care | Payer: 59 | Source: Ambulatory Visit | Attending: Physician Assistant | Admitting: Physician Assistant

## 2023-10-14 DIAGNOSIS — K760 Fatty (change of) liver, not elsewhere classified: Secondary | ICD-10-CM | POA: Diagnosis present

## 2023-10-14 DIAGNOSIS — R1084 Generalized abdominal pain: Secondary | ICD-10-CM | POA: Diagnosis present

## 2023-10-14 MED ORDER — IOHEXOL 300 MG/ML  SOLN
100.0000 mL | Freq: Once | INTRAMUSCULAR | Status: AC | PRN
  Administered 2023-10-14: 100 mL via INTRAVENOUS

## 2023-10-15 ENCOUNTER — Encounter: Payer: Self-pay | Admitting: Nurse Practitioner

## 2023-10-15 ENCOUNTER — Ambulatory Visit: Payer: 59 | Admitting: Nurse Practitioner

## 2023-10-15 VITALS — BP 118/78 | HR 66 | Temp 97.9°F | Ht 66.0 in | Wt 231.0 lb

## 2023-10-15 DIAGNOSIS — Z23 Encounter for immunization: Secondary | ICD-10-CM | POA: Diagnosis not present

## 2023-10-15 DIAGNOSIS — G4733 Obstructive sleep apnea (adult) (pediatric): Secondary | ICD-10-CM

## 2023-10-15 DIAGNOSIS — K5903 Drug induced constipation: Secondary | ICD-10-CM | POA: Diagnosis not present

## 2023-10-15 DIAGNOSIS — K760 Fatty (change of) liver, not elsewhere classified: Secondary | ICD-10-CM | POA: Diagnosis not present

## 2023-10-15 DIAGNOSIS — Z720 Tobacco use: Secondary | ICD-10-CM

## 2023-10-15 DIAGNOSIS — L4059 Other psoriatic arthropathy: Secondary | ICD-10-CM

## 2023-10-15 DIAGNOSIS — Z0000 Encounter for general adult medical examination without abnormal findings: Secondary | ICD-10-CM | POA: Diagnosis not present

## 2023-10-15 LAB — URINALYSIS, MICROSCOPIC ONLY: RBC / HPF: NONE SEEN (ref 0–?)

## 2023-10-15 NOTE — Patient Instructions (Signed)
Nice to see you today I will see you in 1 year for your next physical We updated your flu vaccine today Consider getting the shingles vaccine either here or at your local pharmacy

## 2023-10-15 NOTE — Progress Notes (Signed)
Established Patient Office Visit  Subjective   Patient ID: Brandi Ortiz, female    DOB: [birth year]  Age: 52 y.o. MRN: [id number]  Chief Complaint  Patient presents with   Transitions Of Care    Would like flu shot.     HPI  TOC: patient was last seen on 11/01/2022 by Hannah Beat, MD. Last CPE done on 07/21/2022 bj Gweneth Dimitri, MD  OSA: CPAP that she wears it nightly. States that she will get at least 4 hours and sleeps approx 6 hours   GERD: pepcid daily that works well. Does not have to avoid certain foods.  But does have a healthy eating style  NAFLD: followed by [facility] and recently did a CT abdomen that is in process. She is on wegovy  Psoriatic arthritis: historycal dx and is not follwed by rheum. States that she started taking tumeric since. States that she does not want to see  for complete physical and follow up of chronic conditions.  Immunizations: -Tetanus: Completed in 2023 -Influenza: update today -Shingles: discussed at visit -Pneumonia: too young    Diet: Fair diet.  3 times a day with snacks. Water through the day and some  hot tea Exercise: No regular exercise currently.   Eye exam: Completes annually. glasses  Dental exam: Completes semi-annually    Colonoscopy: Completed in 2019.  Patient states was negative for any polyps Lung Cancer Screening: N/A  Pap smear: 2017 with abnormal history.  Ambulatory referral to GYN today  Mammogram: up to date.  Has mammogram scheduled for 10/29/2023  Sleep: 10 and will get up at 545. Feels rested and with CPAP        Review of Systems  Constitutional:  Negative for chills and fever.  Respiratory:  Negative for shortness of breath.   Cardiovascular:  Negative for chest pain and leg swelling.  Gastrointestinal:  Positive for constipation. Negative for abdominal pain, blood in stool, diarrhea, nausea and vomiting.  Genitourinary:  Negative for dysuria and hematuria.  Neurological:  Negative for tingling  and headaches.  Psychiatric/Behavioral:  Negative for hallucinations and suicidal ideas.       Objective:     BP 118/78   Pulse 66   Temp 97.9 F (36.6 C) (Oral)   Ht 5\' 6"  (1.676 m)   Wt 231 lb (104.8 kg)   LMP 03/23/2018   SpO2 98%   BMI 37.28 kg/m  BP Readings from Last 3 Encounters:  10/15/23 118/78  10/04/23 112/74  09/04/23 103/69   Wt Readings from Last 3 Encounters:  10/15/23 231 lb (104.8 kg)  10/04/23 224 lb (101.6 kg)  09/04/23 230 lb (104.3 kg)   SpO2 Readings from Last 3 Encounters:  10/15/23 98%  10/04/23 99%  09/04/23 97%      Physical Exam Vitals and nursing note reviewed.  Constitutional:      Appearance: Normal appearance.  HENT:     Right Ear: Tympanic membrane, ear canal and external ear normal.     Left Ear: Tympanic membrane, ear canal and external ear normal.     Mouth/Throat:     Mouth: Mucous membranes are moist.     Pharynx: Oropharynx is clear.  Eyes:     Extraocular Movements: Extraocular movements intact.     Pupils: Pupils are equal, round, and reactive to light.  Cardiovascular:     Rate and Rhythm: Normal rate and regular rhythm.     Pulses: Normal pulses.     Heart sounds: Normal heart  sounds.  Pulmonary:     Effort: Pulmonary effort is normal.     Breath sounds: Normal breath sounds.  Abdominal:     General: Bowel sounds are normal. There is no distension.     Palpations: There is no mass.     Tenderness: There is no abdominal tenderness.     Hernia: No hernia is present.  Musculoskeletal:     Right lower leg: No edema.     Left lower leg: No edema.  Lymphadenopathy:     Cervical: No cervical adenopathy.  Skin:    General: Skin is warm.  Neurological:     General: No focal deficit present.     Mental Status: She is alert.     Deep Tendon Reflexes:     Reflex Scores:      Bicep reflexes are 2+ on the right side and 2+ on the left side.      Patellar reflexes are 2+ on the right side and 2+ on the left side.     Comments: Bilateral upper and lower extremity strength 5/5  Psychiatric:        Mood and Affect: Mood normal.        Behavior: Behavior normal.        Thought Content: Thought content normal.        Judgment: Judgment normal.      No results found for any visits on 10/15/23.    The 10-year ASCVD risk score (Arnett DK, et al., 2019) is: 2.2%    Assessment & Plan:   Problem List Items Addressed This Visit       Respiratory   OSA (obstructive sleep apnea)   Patient currently maintained adherent to CPAP therapy.  Continue        Digestive   NAFLD (nonalcoholic fatty liver disease)   Being followed by healthy weight [facility] wellness clinic.  Underwent CT scan yesterday on 10/14/2023 pending result.  Continue following with specialist as recommended      Drug-induced constipation   Patient currently maintained on Wegovy and followed by healthy weight loss clinic continue medication as prescribed        Musculoskeletal and Integument   Polyarticular psoriatic arthritis (HCC)   History of same.  Patient currently maintained on turmeric does not want to be referred to rheumatologist at this juncture        Other   Preventative health care - Primary   Discussed age-appropriate immunizations and screening exams.  Did review patient's personal, surgical, social, family history.  Patient up-to-date on all age-appropriate vaccinations she would like.  Update flu vaccine today.  Encourage patient to get shingles vaccine.  Patient is up-to-date on CRC screening.  Patient has a mammogram scheduled for breast cancer screening.  Amatory referral to GYN for cervical cancer screening.  Patient was given information at discharge about preventative healthcare maintenance with anticipatory guidance.      Relevant Orders   Ambulatory referral to Gynecology   Tobacco use   Patient smokes cigars sometimes.  Will check urine microscopy to rule out microscopic hematuria      Relevant Orders    Urine Microscopic    Return in about 1 year (around 10/14/2024) for CPE and Labs.    Audria Nine, NP

## 2023-10-15 NOTE — Assessment & Plan Note (Signed)
Patient smokes cigars sometimes.  Will check urine microscopy to rule out microscopic hematuria

## 2023-10-15 NOTE — Assessment & Plan Note (Signed)
Patient currently maintained on Wegovy and followed by healthy weight loss clinic continue medication as prescribed

## 2023-10-15 NOTE — Assessment & Plan Note (Signed)
Discussed age-appropriate immunizations and screening exams.  Did review patient's personal, surgical, social, family history.  Patient up-to-date on all age-appropriate vaccinations she would like.  Update flu vaccine today.  Encourage patient to get shingles vaccine.  Patient is up-to-date on CRC screening.  Patient has a mammogram scheduled for breast cancer screening.  Amatory referral to GYN for cervical cancer screening.  Patient was given information at discharge about preventative healthcare maintenance with anticipatory guidance.

## 2023-10-15 NOTE — Addendum Note (Signed)
Addended by: Melina Copa on: 10/15/2023 01:39 PM   Modules accepted: Orders

## 2023-10-15 NOTE — Assessment & Plan Note (Signed)
Patient currently maintained adherent to CPAP therapy.  Continue

## 2023-10-15 NOTE — Assessment & Plan Note (Signed)
History of same.  Patient currently maintained on turmeric does not want to be referred to rheumatologist at this juncture

## 2023-10-15 NOTE — Assessment & Plan Note (Signed)
Being followed by healthy weight and wellness clinic.  Underwent CT scan yesterday on 10/14/2023 pending result.  Continue following with specialist as recommended

## 2023-10-18 ENCOUNTER — Encounter: Payer: Self-pay | Admitting: Nurse Practitioner

## 2023-10-29 ENCOUNTER — Ambulatory Visit (HOSPITAL_BASED_OUTPATIENT_CLINIC_OR_DEPARTMENT_OTHER): Payer: 59 | Admitting: Radiology

## 2023-11-01 ENCOUNTER — Other Ambulatory Visit (INDEPENDENT_AMBULATORY_CARE_PROVIDER_SITE_OTHER): Payer: Self-pay | Admitting: Physician Assistant

## 2023-11-01 DIAGNOSIS — R1084 Generalized abdominal pain: Secondary | ICD-10-CM

## 2023-11-02 ENCOUNTER — Encounter: Payer: Self-pay | Admitting: Nurse Practitioner

## 2023-11-02 ENCOUNTER — Other Ambulatory Visit: Payer: Self-pay | Admitting: Family Medicine

## 2023-11-04 ENCOUNTER — Other Ambulatory Visit (INDEPENDENT_AMBULATORY_CARE_PROVIDER_SITE_OTHER): Payer: Self-pay | Admitting: Physician Assistant

## 2023-11-06 ENCOUNTER — Encounter (INDEPENDENT_AMBULATORY_CARE_PROVIDER_SITE_OTHER): Payer: 59 | Admitting: Physician Assistant

## 2023-11-06 NOTE — Progress Notes (Signed)
 Erroneous encounter

## 2023-11-07 ENCOUNTER — Encounter (HOSPITAL_BASED_OUTPATIENT_CLINIC_OR_DEPARTMENT_OTHER): Payer: Self-pay | Admitting: Radiology

## 2023-11-07 ENCOUNTER — Ambulatory Visit (HOSPITAL_BASED_OUTPATIENT_CLINIC_OR_DEPARTMENT_OTHER)
Admission: RE | Admit: 2023-11-07 | Discharge: 2023-11-07 | Disposition: A | Discharge status: Home / Self Care | Payer: 59 | Source: Ambulatory Visit | Attending: Family Medicine | Admitting: Family Medicine

## 2023-11-07 DIAGNOSIS — Z1231 Encounter for screening mammogram for malignant neoplasm of breast: Secondary | ICD-10-CM | POA: Insufficient documentation

## 2023-11-13 ENCOUNTER — Ambulatory Visit (INDEPENDENT_AMBULATORY_CARE_PROVIDER_SITE_OTHER): Payer: 59 | Admitting: Physician Assistant

## 2023-11-13 ENCOUNTER — Other Ambulatory Visit (INDEPENDENT_AMBULATORY_CARE_PROVIDER_SITE_OTHER): Payer: Self-pay | Admitting: Physician Assistant

## 2023-11-13 ENCOUNTER — Encounter (INDEPENDENT_AMBULATORY_CARE_PROVIDER_SITE_OTHER): Payer: Self-pay | Admitting: Physician Assistant

## 2023-11-13 VITALS — BP 111/75 | HR 79 | Temp 99.0°F | Ht 66.0 in | Wt 224.0 lb

## 2023-11-13 DIAGNOSIS — R1084 Generalized abdominal pain: Secondary | ICD-10-CM

## 2023-11-13 DIAGNOSIS — Z6836 Body mass index (BMI) 36.0-36.9, adult: Secondary | ICD-10-CM

## 2023-11-13 DIAGNOSIS — E669 Obesity, unspecified: Secondary | ICD-10-CM | POA: Diagnosis not present

## 2023-11-13 DIAGNOSIS — Z6839 Body mass index (BMI) 39.0-39.9, adult: Secondary | ICD-10-CM

## 2023-11-13 DIAGNOSIS — R632 Polyphagia: Secondary | ICD-10-CM | POA: Diagnosis not present

## 2023-11-13 DIAGNOSIS — Z7985 Long-term (current) use of injectable non-insulin antidiabetic drugs: Secondary | ICD-10-CM

## 2023-11-13 DIAGNOSIS — K8020 Calculus of gallbladder without cholecystitis without obstruction: Secondary | ICD-10-CM | POA: Diagnosis not present

## 2023-11-13 MED ORDER — WEGOVY 1 MG/0.5ML ~~LOC~~ SOAJ: 1.0000 mg | SUBCUTANEOUS | 0 refills | Status: DC

## 2023-11-13 NOTE — Progress Notes (Signed)
SUBJECTIVE:  Chief Complaint: Obesity  Interim History: She is maintaining weight loss since her last visit.  Down 35 lbs total TBW loss of 13.5% Brandi Ortiz is a 53 year old individual with a history of obesity, insulin resistance, elevated liver function studies, and vitamin D deficiency. She has been on [facility] for polyphagia and recently underwent a CT of the abdomen due to intermittent diffuse abdominal pain/ episodic nausea with vomiting. The CT scan revealed mild prominence of the appendix with prominent ileocecal lymph nodes, cholelithiasis without acute cholecystitis, and incidental uterine leiomyomas. The patient has been referred to general surgery but has not yet been seen.  The patient reports struggling with episodes of abdominal discomfort, which have been less frequent in the past two weeks. She has been managing her symptoms with a detox tea containing fiber and senna, which she consumes every day or every other day. The patient has also been off [facility] for a week due to illness.  The patient has been following a diet rich in protein and vegetables, with limited starch intake. She has lost 35 pounds since starting Wegovy and has been maintaining her weight despite not exercising regularly due to cold weather. She has free weights at home and is considering starting a strength training routine.  The patient has a history of fatty liver disease and recalls a previous ultrasound revealing gallstones. She has been experiencing episodes of nausea, vomiting, and burping bile, which she associates with her gallbladder. She has an upcoming appointment with general surgery to further evaluate her gallbladder and appendix.  Brandi Ortiz is here to discuss her progress with her obesity treatment plan. She is on the Category 3 Plan and states she is following her eating plan approximately 50 % of the time. She states she is not exercising 0 minutes 0 times per week.   OBJECTIVE: Visit  Diagnoses: Problem List Items Addressed This Visit     Calculus of gallbladder without cholecystitis without obstruction   Polyphagia   Relevant Medications   Semaglutide-Weight Management (WEGOVY) 1 MG/0.5ML SOAJ   Obesity (HCC)- Start BMI 41.8   Relevant Medications   Semaglutide-Weight Management (WEGOVY) 1 MG/0.5ML SOAJ   Generalized abdominal pain - Primary   Other Visit Diagnoses       BMI 39.0-39.9,adult Current BMI 39.9         ABD pain and intermittent N/V- Cholelithiasis CT of the abdomen showed cholelithiasis without acute cholecystitis. Experiences intermittent abdominal pain and nausea, likely related to gallstones.  Explained that weight loss and Wegovy can contribute to gallbladder issues. - Follow up with general surgery on November 22, 2023 - Discuss potential further studies with general surgery - Monitor for symptoms of acute cholecystitis Appendiceal and Ileocecal Lymph Node Prominence CT of the abdomen showed mild prominence of the appendix and prominent ileocecal lymph nodes, suggesting possible chronic appendicitis or other inflammatory conditions. Differential diagnosis includes chronic appendicitis and gallbladder issues. Discussed potential for further imaging or surgical evaluation. - Follow up with general surgery on November 22, 2023 - Discuss potential further studies with general surgery  Obesity with polyphagia 53 year old with obesity, currently on Wegovy. Weight has decreased by 35 pounds to 224 lbs. BMI is 36. Discussed the potential impact of Wegovy on gallbladder issues. Considering reducing the dose of Wegovy to maintain weight loss and minimize side effects. Prefers to continue [facility] until further evaluation by general surgery. - Continue Wegovy at a reduced dose to 1 mg weekly - Monitor weight and BMI - Encourage high-protein  diet and meal prep - Advise on home-based strength training exercises  Insulin Resistance Insulin resistance. No  specific discussion of current management in this visit.   Vitamin D Deficiency Vitamin D deficiency. No specific discussion of current management in this visit.  General Health Maintenance Discussed the importance of maintaining a balanced diet, regular exercise, and monitoring overall health metrics. Encouraged home-based strength training due to difficulty exercising in cold weather. - Encourage high-protein diet and meal prep - Advise on home-based strength training exercises - Monitor overall body fat percentage and visceral adipose rating  Follow-up - Follow up with general surgery on November 22, 2023 - Send MyChart message if further studies are recommended.  Vitals Temp: 99 F (37.2 C) BP: 111/75 Pulse Rate: 79 SpO2: 100 %   Anthropometric Measurements Height: 5\' 6"  (1.676 m) Weight: 224 lb (101.6 kg) BMI (Calculated): 36.17 Weight at Last Visit: 224 lb Weight Lost Since Last Visit: 0 Weight Gained Since Last Visit: 0 Starting Weight: 259 lb Total Weight Loss (lbs): 35 lb (15.9 kg) Peak Weight: 270 lb   Body Composition  Body Fat %: 40.7 % Fat Mass (lbs): 91.4 lbs Muscle Mass (lbs): 126.6 lbs Total Body Water (lbs): 85.4 lbs Visceral Fat Rating : 11   Other Clinical Data Fasting: yes Labs: no Today's Visit #: 18 Starting Date: 06/08/22     ASSESSMENT AND PLAN:  Diet: Brandi Ortiz is currently in the action stage of change. As such, her goal is to maintain weight for now. She has agreed to Category 3 Plan.  Exercise: Brandi Ortiz has been instructed to work up to a goal of 150 minutes of combined cardio and strengthening exercise per week for weight loss and overall health benefits.   Behavior Modification:  We discussed the following Behavioral Modification Strategies today: increasing lean protein intake, decreasing simple carbohydrates, increasing vegetables, increase H2O intake, increase high fiber foods, no skipping meals, meal planning and cooking  strategies, and planning for success. We discussed various medication options to help Brandi Ortiz with her weight loss efforts and we both agreed to decrease Wegovy to 1 mg weekly for medical weight loss.  Return in about 4 weeks (around 12/11/2023).Marland Kitchen She was informed of the importance of frequent follow up visits to maximize her success with intensive lifestyle modifications for her multiple health conditions.  Attestation Statements:   Reviewed by clinician on day of visit: allergies, medications, problem list, medical history, surgical history, family history, social history, and previous encounter notes.   Time spent on visit including pre-visit chart review and post-visit care and charting was 42 minutes.    Dodie Parisi, PA-C

## 2023-11-15 ENCOUNTER — Other Ambulatory Visit (INDEPENDENT_AMBULATORY_CARE_PROVIDER_SITE_OTHER): Payer: Self-pay | Admitting: Physician Assistant

## 2023-11-15 ENCOUNTER — Encounter (INDEPENDENT_AMBULATORY_CARE_PROVIDER_SITE_OTHER): Payer: Self-pay | Admitting: Physician Assistant

## 2023-11-15 DIAGNOSIS — G4733 Obstructive sleep apnea (adult) (pediatric): Secondary | ICD-10-CM

## 2023-11-15 DIAGNOSIS — E88819 Insulin resistance, unspecified: Secondary | ICD-10-CM

## 2023-11-15 DIAGNOSIS — R632 Polyphagia: Secondary | ICD-10-CM

## 2023-11-15 MED ORDER — ZEPBOUND 5 MG/0.5ML ~~LOC~~ SOAJ: 5.0000 mg | SUBCUTANEOUS | 0 refills | Status: DC

## 2023-11-22 ENCOUNTER — Ambulatory Visit: Payer: Self-pay | Admitting: Surgery

## 2023-12-10 NOTE — Progress Notes (Incomplete Revision)
COVID Vaccine Completed: yes  Date of COVID positive in last 90 days: no  PCP - Mordecai Maes, NP Cardiologist - yeas ago for heart murmur  Chest x-ray - n/a EKG - 12/11/23 Epic/chart Stress Test - 06/30/15 [facility] ECHO - 12/25/18 [facility] Cardiac Cath - n./a Pacemaker/ICD device last checked: n/a Spinal Cord Stimulator: n/a  Bowel Prep - no  Sleep Study - yes CPAP - yes, most nights   Fasting Blood Sugar - n/a Checks Blood Sugar _____ times a day  Last dose of GLP1 agonist-  Zebound GLP1 instructions:  Hold 7 days before surgery. Last dose 12/09/23, do not take 12/16/23   Last dose of SGLT-2 inhibitors-  N/A SGLT-2 instructions:  Hold 3 days before surgery    Blood Thinner Instructions:  Last dose: n/a  Time: Aspirin Instructions: Last Dose:  Activity level: Can go up a flight of stairs and perform activities of daily living without stopping and without symptoms of chest pain or shortness of breath.  Anesthesia review: congestion  Patient denies shortness of breath, fever, cough and chest pain at PAT appointment  Patient verbalized understanding of instructions that were given to them at the PAT appointment. Patient was also instructed that they will need to review over the PAT instructions again at home before surgery.

## 2023-12-10 NOTE — Progress Notes (Signed)
COVID Vaccine Completed: yes  Date of COVID positive in last 90 days:  PCP - Mordecai Maes, NP Cardiologist -   Chest x-ray -  EKG -  Stress Test - 06/30/15 [facility] ECHO - 12/25/18 [facility] Cardiac Cath -  Pacemaker/ICD device last checked: Spinal Cord Stimulator:  Bowel Prep -   Sleep Study -  CPAP -   Fasting Blood Sugar -  Checks Blood Sugar _____ times a day  Last dose of GLP1 agonist-  N/A GLP1 instructions:  Hold 7 days before surgery    Last dose of SGLT-2 inhibitors-  N/A SGLT-2 instructions:  Hold 3 days before surgery    Blood Thinner Instructions:  Last dose:   Time: Aspirin Instructions: Last Dose:  Activity level:  Can go up a flight of stairs and perform activities of daily living without stopping and without symptoms of chest pain or shortness of breath.  Able to exercise without symptoms  Unable to go up a flight of stairs without symptoms of     Anesthesia review:   Patient denies shortness of breath, fever, cough and chest pain at PAT appointment  Patient verbalized understanding of instructions that were given to them at the PAT appointment. Patient was also instructed that they will need to review over the PAT instructions again at home before surgery.

## 2023-12-10 NOTE — Patient Instructions (Signed)
 SURGICAL WAITING ROOM VISITATION  Patients having surgery or a procedure may have no more than 2 support people in the waiting area - these visitors may rotate.    Children under the age of 21 must have an adult with them who is not the patient.  Due to an increase in RSV and influenza rates and associated hospitalizations, children ages 54 and under may not visit patients in [facility] hospitals.  Visitors with respiratory illnesses are discouraged from visiting and should remain at home.  If the patient needs to stay at the hospital during part of their recovery, the visitor guidelines for inpatient rooms apply. Pre-op nurse will coordinate an appropriate time for 1 support person to accompany patient in pre-op.  This support person may not rotate.    Please refer to the [facility] website for the visitor guidelines for Inpatients (after your surgery is over and you are in a regular room).    Your procedure is scheduled on: 12/06/23   Report to [facility] Main Entrance    Report to admitting at 10:15 AM   Call this number if you have problems the morning of surgery [phone]   Do not eat food or drink liquids :After Midnight.   After Midnight you may have the following liquids until ______ AM/ PM DAY OF SURGERY         If you have questions, please contact your surgeon's office.   FOLLOW BOWEL PREP AND ANY ADDITIONAL PRE OP INSTRUCTIONS YOU RECEIVED FROM YOUR SURGEON'S OFFICE!!!     Oral Hygiene is also important to reduce your risk of infection.                                    Remember - BRUSH YOUR TEETH THE MORNING OF SURGERY WITH YOUR REGULAR TOOTHPASTE  DENTURES WILL BE REMOVED PRIOR TO SURGERY PLEASE DO NOT APPLY "Poly grip" OR ADHESIVES!!!   Stop all vitamins and herbal supplements 7 days before surgery.   Take these medicines the morning of surgery with A SIP OF WATER: None                               You may not have any metal on your body  including hair pins, jewelry, and body piercing             Do not wear make-up, lotions, powders, perfumes, or deodorant  Do not wear nail polish including gel and S&S, artificial/acrylic nails, or any other type of covering on natural nails including finger and toenails. If you have artificial nails, gel coating, etc. that needs to be removed by a nail salon please have this removed prior to surgery or surgery may need to be canceled/ delayed if the surgeon/ anesthesia feels like they are unable to be safely monitored.   Do not shave  48 hours prior to surgery.   Do not bring valuables to the hospital. [facility] IS NOT             RESPONSIBLE   FOR VALUABLES.   Contacts, glasses, dentures or bridgework may not be worn into surgery.  DO NOT BRING YOUR HOME MEDICATIONS TO THE HOSPITAL. PHARMACY WILL DISPENSE MEDICATIONS LISTED ON YOUR MEDICATION LIST TO YOU DURING YOUR ADMISSION IN THE HOSPITAL!    Patients discharged on the day of surgery will not  be allowed to drive home.  Someone NEEDS to stay with you for the first 24 hours after anesthesia.              Please read over the following fact sheets you were given: IF YOU HAVE QUESTIONS ABOUT YOUR PRE-OP INSTRUCTIONS PLEASE CALL [phone] Contras    If you received a COVID test during your pre-op visit  it is requested that you wear a mask when out in public, stay away from anyone that may not be feeling well and notify your surgeon if you develop symptoms. If you test positive for Covid or have been in contact with anyone that has tested positive in the last 10 days please notify you surgeon.    [facility] - Preparing for Surgery Before surgery, you can play an important role.  Because skin is not sterile, your skin needs to be as free of germs as possible.  You can reduce the number of germs on your skin by washing with CHG (chlorahexidine gluconate) soap before surgery.  CHG is an antiseptic cleaner which kills germs and bonds with  the skin to continue killing germs even after washing. Please DO NOT use if you have an allergy to CHG or antibacterial soaps.  If your skin becomes reddened/irritated stop using the CHG and inform your nurse when you arrive at Short Stay. Do not shave (including legs and underarms) for at least 48 hours prior to the first CHG shower.  You may shave your face/neck.  Please follow these instructions carefully:  1.  Shower with CHG Soap the night before surgery and the  morning of surgery.  2.  If you choose to wash your hair, wash your hair first as usual with your normal  shampoo.  3.  After you shampoo, rinse your hair and body thoroughly to remove the shampoo.                             4.  Use CHG as you would any other liquid soap.  You can apply chg directly to the skin and wash.  Gently with a scrungie or clean washcloth.  5.  Apply the CHG Soap to your body ONLY FROM THE NECK DOWN.   Do   not use on face/ open                           Wound or open sores. Avoid contact with eyes, ears mouth and   genitals (private parts).                       Wash face,  Genitals (private parts) with your normal soap.             6.  Wash thoroughly, paying special attention to the area where your    surgery  will be performed.  7.  Thoroughly rinse your body with warm water from the neck down.  8.  DO NOT shower/wash with your normal soap after using and rinsing off the CHG Soap.                9.  Pat yourself dry with a clean towel.            10.  Wear clean pajamas.            11.  Place clean sheets on your bed the night  of your first shower and do not  sleep with pets. Day of Surgery : Do not apply any lotions/deodorants the morning of surgery.  Please wear clean clothes to the hospital/surgery center.  FAILURE TO FOLLOW THESE INSTRUCTIONS MAY RESULT IN THE CANCELLATION OF YOUR SURGERY  PATIENT SIGNATURE_________________________________  NURSE  SIGNATURE__________________________________  ________________________________________________________________________

## 2023-12-11 ENCOUNTER — Other Ambulatory Visit: Payer: Self-pay

## 2023-12-11 ENCOUNTER — Encounter (HOSPITAL_COMMUNITY): Payer: Self-pay

## 2023-12-11 ENCOUNTER — Encounter (HOSPITAL_COMMUNITY)
Admission: RE | Admit: 2023-12-11 | Discharge: 2023-12-11 | Disposition: A | Discharge status: Home / Self Care | Payer: 59 | Source: Ambulatory Visit | Attending: Surgery | Admitting: Surgery

## 2023-12-11 VITALS — BP 123/87 | HR 75 | Temp 98.0°F | Resp 16 | Ht 66.0 in | Wt 227.0 lb

## 2023-12-11 DIAGNOSIS — Z01818 Encounter for other preprocedural examination: Secondary | ICD-10-CM | POA: Insufficient documentation

## 2023-12-11 DIAGNOSIS — I2510 Atherosclerotic heart disease of native coronary artery without angina pectoris: Secondary | ICD-10-CM | POA: Diagnosis not present

## 2023-12-11 DIAGNOSIS — R748 Abnormal levels of other serum enzymes: Secondary | ICD-10-CM | POA: Diagnosis not present

## 2023-12-11 HISTORY — DX: Unspecified osteoarthritis, unspecified site: M19.90

## 2023-12-11 HISTORY — DX: Sleep apnea, unspecified: G47.30

## 2023-12-11 HISTORY — DX: Nonscarring hair loss, unspecified: L65.9

## 2023-12-11 HISTORY — DX: Gastro-esophageal reflux disease without esophagitis: K21.9

## 2023-12-11 LAB — CBC
HCT: 43.6 % (ref 36.0–46.0)
Hemoglobin: 14 g/dL (ref 12.0–15.0)
MCH: 27 pg (ref 26.0–34.0)
MCHC: 32.1 g/dL (ref 30.0–36.0)
MCV: 84 fL (ref 80.0–100.0)
Platelets: 240 10*3/uL (ref 150–400)
RBC: 5.19 MIL/uL — ABNORMAL HIGH (ref 3.87–5.11)
RDW: 13.7 % (ref 11.5–15.5)
WBC: 6.9 10*3/uL (ref 4.0–10.5)
nRBC: 0 % (ref 0.0–0.2)

## 2023-12-11 LAB — COMPREHENSIVE METABOLIC PANEL
ALT: 62 U/L — ABNORMAL HIGH (ref 0–44)
AST: 36 U/L (ref 15–41)
Albumin: 4.2 g/dL (ref 3.5–5.0)
Alkaline Phosphatase: 139 U/L — ABNORMAL HIGH (ref 38–126)
Anion gap: 10 (ref 5–15)
BUN: 27 mg/dL — ABNORMAL HIGH (ref 6–20)
CO2: 24 mmol/L (ref 22–32)
Calcium: 8.8 mg/dL — ABNORMAL LOW (ref 8.9–10.3)
Chloride: 104 mmol/L (ref 98–111)
Creatinine, Ser: 0.77 mg/dL (ref 0.44–1.00)
GFR, Estimated: >60 mL/min (ref >60)
Glucose, Bld: 86 mg/dL (ref 70–99)
Potassium: 3.8 mmol/L (ref 3.5–5.1)
Sodium: 138 mmol/L (ref 135–145)
Total Bilirubin: 0.7 mg/dL (ref 0.0–1.2)
Total Protein: 7.6 g/dL (ref 6.5–8.1)

## 2023-12-12 ENCOUNTER — Encounter: Payer: Self-pay | Admitting: Family Medicine

## 2023-12-12 ENCOUNTER — Ambulatory Visit: Payer: 59 | Admitting: Family Medicine

## 2023-12-12 ENCOUNTER — Other Ambulatory Visit (HOSPITAL_COMMUNITY)
Admission: RE | Admit: 2023-12-12 | Discharge: 2023-12-12 | Disposition: A | Discharge status: Home / Self Care | Payer: 59 | Source: Ambulatory Visit | Attending: Family Medicine | Admitting: Family Medicine

## 2023-12-12 VITALS — BP 120/82 | HR 69 | Ht 66.0 in | Wt 229.4 lb

## 2023-12-12 DIAGNOSIS — Z124 Encounter for screening for malignant neoplasm of cervix: Secondary | ICD-10-CM | POA: Insufficient documentation

## 2023-12-12 DIAGNOSIS — Z113 Encounter for screening for infections with a predominantly sexual mode of transmission: Secondary | ICD-10-CM | POA: Diagnosis not present

## 2023-12-12 DIAGNOSIS — Z01419 Encounter for gynecological examination (general) (routine) without abnormal findings: Secondary | ICD-10-CM | POA: Diagnosis not present

## 2023-12-12 NOTE — Progress Notes (Addendum)
Subjective:     Brandi Ortiz is a 53 y.o. female and is here for a comprehensive physical exam. The patient reports no problems. She has been menopausal since 2017.    The following portions of the patient's history were reviewed and updated as appropriate: allergies, current medications, past family history, past medical history, past social history, past surgical history, and problem list.  Review of Systems Pertinent items noted in HPI and remainder of comprehensive ROS otherwise negative.   Objective:    BP 120/82   Pulse 69   Ht 5\' 6"  (1.676 m)   Wt 229 lb 6.4 oz (104.1 kg)   LMP 03/23/2018   BMI 37.03 kg/m  General appearance: alert, cooperative, and appears stated age Head: Normocephalic, without obvious abnormality, atraumatic Neck: no adenopathy, supple, symmetrical, trachea midline, and thyroid not enlarged, symmetric, no tenderness/mass/nodules Lungs: clear to auscultation bilaterally Heart: regular rate and rhythm, S1, S2 normal, no murmur, click, rub or gallop Abdomen: soft, non-tender; bowel sounds normal; no masses,  no organomegaly Pelvic: cervix normal in appearance, external genitalia normal, no adnexal masses or tenderness, no cervical motion tenderness, uterus normal size, shape, and consistency, and vagina normal without discharge Extremities: extremities normal, atraumatic, no cyanosis or edema Pulses: 2+ and symmetric Skin: Skin color, texture, turgor normal. No rashes or lesions Lymph nodes: Cervical, supraclavicular, and axillary nodes normal. Neurologic: Grossly normal    Assessment:    Healthy female exam.      Plan:   Problem List Items Addressed This Visit   None Visit Diagnoses       Screening for malignant neoplasm of cervix    -  Primary   Relevant Orders   Cytology - PAP     Encounter for gynecological examination without abnormal finding       recent mammogram     Screen for STD (sexually transmitted disease)       Relevant Orders    RPR+HBsAg+HCVAb+...      Return in 1 year (on 12/11/2024).    See After Visit Summary for Counseling Recommendations

## 2023-12-12 NOTE — Progress Notes (Signed)
Patient presents for Annual.  LMP: Post Menopausal  Last pap: Date: 2019-WNL Contraception: None Mammogram: Up to date: 11/08/23 STD Screening: Accepts Flu Vaccine : N/A  CC:  Annual

## 2023-12-13 LAB — RPR+HBSAG+HCVAB+...
HIV Screen 4th Generation wRfx: NONREACTIVE
Hep C Virus Ab: NONREACTIVE
Hepatitis B Surface Ag: NEGATIVE
RPR Ser Ql: NONREACTIVE

## 2023-12-14 ENCOUNTER — Encounter: Payer: Self-pay | Admitting: Nurse Practitioner

## 2023-12-14 ENCOUNTER — Encounter: Payer: Self-pay | Admitting: Family Medicine

## 2023-12-14 LAB — CYTOLOGY - PAP
Chlamydia: NEGATIVE
Comment: NEGATIVE
Comment: NEGATIVE
Comment: NEGATIVE
Comment: NORMAL
Diagnosis: NEGATIVE
High risk HPV: NEGATIVE
Neisseria Gonorrhea: NEGATIVE
Trichomonas: NEGATIVE

## 2023-12-14 NOTE — Telephone Encounter (Signed)
Printed and placed in box for review

## 2023-12-17 NOTE — Telephone Encounter (Signed)
 Abstracted when first sent

## 2023-12-17 NOTE — Telephone Encounter (Signed)
 Can we abstract records to the chart please

## 2023-12-18 ENCOUNTER — Ambulatory Visit (INDEPENDENT_AMBULATORY_CARE_PROVIDER_SITE_OTHER): Payer: 59 | Admitting: Physician Assistant

## 2023-12-18 ENCOUNTER — Encounter (INDEPENDENT_AMBULATORY_CARE_PROVIDER_SITE_OTHER): Payer: Self-pay | Admitting: Physician Assistant

## 2023-12-18 VITALS — BP 107/75 | HR 80 | Temp 98.3°F | Ht 66.0 in | Wt 222.0 lb

## 2023-12-18 DIAGNOSIS — K8020 Calculus of gallbladder without cholecystitis without obstruction: Secondary | ICD-10-CM | POA: Diagnosis not present

## 2023-12-18 DIAGNOSIS — E88819 Insulin resistance, unspecified: Secondary | ICD-10-CM

## 2023-12-18 DIAGNOSIS — Z6835 Body mass index (BMI) 35.0-35.9, adult: Secondary | ICD-10-CM

## 2023-12-18 DIAGNOSIS — E669 Obesity, unspecified: Secondary | ICD-10-CM

## 2023-12-18 DIAGNOSIS — K760 Fatty (change of) liver, not elsewhere classified: Secondary | ICD-10-CM

## 2023-12-18 DIAGNOSIS — E559 Vitamin D deficiency, unspecified: Secondary | ICD-10-CM | POA: Diagnosis not present

## 2023-12-18 DIAGNOSIS — K047 Periapical abscess without sinus: Secondary | ICD-10-CM

## 2023-12-18 DIAGNOSIS — G4733 Obstructive sleep apnea (adult) (pediatric): Secondary | ICD-10-CM | POA: Diagnosis not present

## 2023-12-18 DIAGNOSIS — K5903 Drug induced constipation: Secondary | ICD-10-CM

## 2023-12-18 MED ORDER — ZEPBOUND 5 MG/0.5ML ~~LOC~~ SOAJ: 5.0000 mg | SUBCUTANEOUS | 0 refills | Status: DC

## 2023-12-18 NOTE — Progress Notes (Signed)
 SUBJECTIVE: Discussed the use of AI scribe software for clinical note transcription with the patient, who gave verbal consent to proceed.  Chief Complaint: Obesity  Interim History: She is down 2 lbs since last visit. Scheduled for Cholecystectomy with Dr. Darnell Level 01/07/24  Zepbound started 11/15/23 Reginal Lutes 8/23 until 11/13/23  Brandi Ortiz is here to discuss her progress with her obesity treatment plan. She is on the Category 3 Plan and states she is following her eating plan approximately 50 % of the time. She states she is exercising elliptical 30 minutes 1 times per week. Brandi Ortiz is a 53 year old female who presents for follow-up of her obesity treatment plan.  She is actively engaged in a weight management program and has made significant progress in managing her obesity. She uses a fiber mix to maintain regular bowel movements and takes Zepband 5 mg, which she finds effective. Her insurance mandates participation in the [facility] weight management program, which includes regular weigh-ins and activity tracking.  She has been diagnosed with cholelithiasis without cholecystitis or obstruction and is scheduled for a laparoscopic cholecystectomy with an intraoperative cholangiogram on January 07, 2024. Liver function tests showed some elevation, and a CT scan revealed a slightly enlarged appendix, which will be evaluated during her surgery.  She is currently taking amoxicillin every eight hours for a dental implant infection, which has resulted in a persistent bump in her mouth. Despite antibiotic treatment, the bump has not resolved, and she plans to contact her dentist for further evaluation.  She recently experienced job-related stress due to changes in her employment status, which has been a significant source of stress. Despite this, she has managed to lose weight. She is in the process of moving to a new apartment, which she plans to complete before her surgery  date.  OBJECTIVE: Visit Diagnoses: Problem List Items Addressed This Visit     Vitamin D deficiency   Calculus of gallbladder without cholecystitis without obstruction - Primary   Insulin resistance   Polyphagia   OSA (obstructive sleep apnea)   Relevant Medications   tirzepatide (ZEPBOUND) 5 MG/0.5ML Pen   Drug-induced constipation   Other Visit Diagnoses       Obesity (HCC)- Start BMI 41.8       Relevant Medications   tirzepatide (ZEPBOUND) 5 MG/0.5ML Pen      Obesity Brandi Ortiz reports significant improvement with her current regimen. On 5 mg Zepbound, covered under obstructive sleep apnea diagnosis. Compliant with weight management program, including fiber mix for regular bowel movements.  - Continue Zepbound 5 mg weekly - Monitor weight and compliance with weight management program  OSA using CPAP Using CPAP with good compliance.  On Zepbound with 37 lb weight loss overall.  Noted improvement in GI symptoms, less nausea overall and no episodes of abdominal pain or other symptoms since starting Zepbound. Monitor closely.  Intensive lifestyle modifications are the first line treatment for this issue. We discussed several lifestyle modifications today and she will continue to work on diet, exercise and weight loss efforts. We will continue to monitor. Orders and follow up as documented in patient record.  Intensive lifestyle modifications are the first line treatment for this issue. We discussed several lifestyle modifications today and she will continue to work on diet, exercise and weight loss efforts. We will continue to monitor. Orders and follow up as documented in patient record.   Continue and refill Zepbound 5 mg weekly  Cholelithiasis Brandi Ortiz has cholelithiasis without cholecystitis or obstruction. Scheduled  for laparoscopic cholecystectomy with intraoperative cholangiogram on January 07, 2024. Preoperative labs show slightly elevated BUN (likely due to dehydration) and low  calcium. Liver function tests show mild elevation, likely related to gallbladder condition.  - Planned laparoscopic cholecystectomy and intraoperative cholangiogram on January 07, 2024 with Dr. Gerrit Friends.  Monitor closely once resumes GLP/GIP medication post operatively.   Dental Infection Brandi Ortiz reports a bump on her gum near a recent dental implant, currently taking amoxicillin every 8 hours. The bump persists, raising concerns about infection impacting upcoming surgery. Plans to contact her dentist for further evaluation. - Contact dentist for evaluation of dental infection - Complete current course of amoxicillin (21 tablets, 7 days)  Insulin Resistance Last fasting insulin was 17.4- improved. A1c was 4.6- at goal. Polyphagia:No Medication(s): Zepbound 5.0 mg SQ weekly Lab Results  Component Value Date   HGBA1C 4.6 (L) 09/04/2023   HGBA1C 4.5 (L) 03/05/2023   HGBA1C 5.1 06/08/2022   HGBA1C 4.8 07/19/2021   HGBA1C 4.5 (L) 04/15/2018   Lab Results  Component Value Date   INSULIN 17.4 09/04/2023   INSULIN 41.6 (H) 03/05/2023   INSULIN 39.3 (H) 06/08/2022    Plan: Continue and refill Zepbound 5.0 mg SQ weekly Continue working on nutrition plan to decrease simple carbohydrates, increase lean proteins and exercise to promote weight loss, improve glycemic control and prevent progression to Type 2 diabetes.    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 Liver function tests show mild elevation, likely related to gallbladder condition in addition to MAFLD. - Monitor liver function tests postoperatively Plan:  Lab results reviewed with patient.  Disease counseling done.  Intensive lifestyle modifications are the first line treatment for this issue.  We discussed several lifestyle modifications today and she will continue to work on diet, exercise and weight loss efforts.   Counseling: NAFLD is an umbrella term that encompasses a disease spectrum that includes  steatosis (fat) without inflammation, steatohepatitis (NASH; fat + inflammation in a characteristic pattern), and cirrhosis. Bland steatosis is felt to be a benign condition, with extremely low to no risk of progression to cirrhosis, whereas NASH can progress to cirrhosis. The mainstay of treatment of NAFLD includes lifestyle modification to achieve weight loss, at least 7% of current body weight. Low carbohydrate diets can be beneficial in improving NAFLD liver histology. Additionally, exercise, even the absence of weight loss can have beneficial effects on the patient's metabolic profile and liver health. We recommend that their metabolic comorbidities be aggressively managed, as patients with NAFLD are at increased risk of coronary artery disease.  Vitamin D Deficiency Brandi Ortiz has vitamin D deficiency. - Monitor vitamin D levels as per previous recommendations  General Health Maintenance Discussed importance of hydration, especially for upcoming surgery and overall health. Encouraged regular physical activity post-surgery to aid recovery. - Encourage adequate hydration - Encourage regular physical activity post-surgery  Follow-up - Follow-up appointment post-surgery to monitor recovery and recheck lab values.  Vitals Temp: 98.3 F (36.8 C) BP: 107/75 Pulse Rate: 80 SpO2: 99 %   Anthropometric Measurements Height: 5\' 6"  (1.676 m) Weight: 222 lb (100.7 kg) BMI (Calculated): 35.85 Weight at Last Visit: 224 lb Weight Lost Since Last Visit: 2 lb Weight Gained Since Last Visit: 0 Starting Weight: 259 lb Total Weight Loss (lbs): 37 lb (16.8 kg) Peak Weight: 270 lb   Body Composition  Body Fat %: 38.6 % Fat Mass (lbs): 85.8 lbs Muscle Mass (lbs): 129.6 lbs Total Body Water (lbs): 84.4 lbs Visceral Fat  Rating : 10   Other Clinical Data Fasting: no Labs: no Today's Visit #: 19 Starting Date: 06/08/22     ASSESSMENT AND PLAN:  Diet: Beatriz is currently in the action  stage of change. As such, her goal is to continue with weight loss efforts and has agreed to the Category 3 Plan.   Exercise:  For substantial health benefits, adults should do at least 150 minutes (2 hours and 30 minutes) a week of moderate-intensity, or 75 minutes (1 hour and 15 minutes) a week of vigorous-intensity aerobic physical activity, or an equivalent combination of moderate- and vigorous-intensity aerobic activity. Aerobic activity should be performed in episodes of at least 10 minutes, and preferably, it should be spread throughout the week.  Behavior Modification:  We discussed the following Behavioral Modification Strategies today: increasing lean protein intake, decreasing simple carbohydrates, increasing vegetables, increase H2O intake, increase high fiber foods, no skipping meals, emotional eating strategies , avoiding temptations, and planning for success. We discussed various medication options to help Laney with her weight loss efforts and we both agreed to continue Zepbound 5 mg weekly.  Return in about 4 weeks (around 01/15/2024).Marland Kitchen She was informed of the importance of frequent follow up visits to maximize her success with intensive lifestyle modifications for her multiple health conditions.  Attestation Statements:   Reviewed by clinician on day of visit: allergies, medications, problem list, medical history, surgical history, family history, social history, and previous encounter notes.   Time spent on visit including pre-visit chart review and post-visit care and charting was 50 minutes  Edilberto Roosevelt,PA-C

## 2023-12-31 NOTE — Patient Instructions (Signed)
 SURGICAL WAITING ROOM VISITATION  Patients having surgery or a procedure may have no more than 2 support people in the waiting area - these visitors may rotate.    Children under the age of 59 must have an adult with them who is not the patient.  Due to an increase in RSV and influenza rates and associated hospitalizations, children ages 86 and under may not visit patients in [facility] hospitals.  Visitors with respiratory illnesses are discouraged from visiting and should remain at home.  If the patient needs to stay at the hospital during part of their recovery, the visitor guidelines for inpatient rooms apply. Pre-op nurse will coordinate an appropriate time for 1 support person to accompany patient in pre-op.  This support person may not rotate.    Please refer to the [facility] website for the visitor guidelines for Inpatients (after your surgery is over and you are in a regular room).    Your procedure is scheduled on: 01/07/24   Report to [facility] Main Entrance    Report to admitting at 7:45 AM   Call this number if you have problems the morning of surgery [phone]   Do not eat food :After Midnight.   After Midnight you may have the following liquids until 7:00 AM DAY OF SURGERY  Water Non-Citrus Juices (without pulp, NO RED-Apple, White grape, White cranberry) Black Coffee (NO MILK/CREAM OR CREAMERS, sugar ok)  Clear Tea (NO MILK/CREAM OR CREAMERS, sugar ok) regular and decaf                             Plain Jell-O (NO RED)                                           Fruit ices (not with fruit pulp, NO RED)                                     Popsicles (NO RED)                                                               Sports drinks like Gatorade (NO RED)                     If you have questions, please contact your surgeon's office.   FOLLOW BOWEL PREP AND ANY ADDITIONAL PRE OP INSTRUCTIONS YOU RECEIVED FROM YOUR SURGEON'S OFFICE!!!     Oral  Hygiene is also important to reduce your risk of infection.                                    Remember - BRUSH YOUR TEETH THE MORNING OF SURGERY WITH YOUR REGULAR TOOTHPASTE  DENTURES WILL BE REMOVED PRIOR TO SURGERY PLEASE DO NOT APPLY "Poly grip" OR ADHESIVES!!!   Stop all vitamins and herbal supplements 7 days before surgery.   Take these medicines the morning of surgery with A SIP OF WATER: Pepcid. Allegra  Hold Zepbound 7 days prior to surgery.  DO NOT TAKE ANY ORAL DIABETIC MEDICATIONS DAY OF YOUR SURGERY  Bring CPAP mask and tubing day of surgery.                              You may not have any metal on your body including hair pins, jewelry, and body piercing             Do not wear make-up, lotions, powders, perfumes, or deodorant  Do not wear nail polish including gel and S&S, artificial/acrylic nails, or any other type of covering on natural nails including finger and toenails. If you have artificial nails, gel coating, etc. that needs to be removed by a nail salon please have this removed prior to surgery or surgery may need to be canceled/ delayed if the surgeon/ anesthesia feels like they are unable to be safely monitored.   Do not shave  48 hours prior to surgery.    Do not bring valuables to the hospital. [facility] IS NOT             RESPONSIBLE   FOR VALUABLES.   Contacts, glasses, dentures or bridgework may not be worn into surgery.  DO NOT BRING YOUR HOME MEDICATIONS TO THE HOSPITAL. PHARMACY WILL DISPENSE MEDICATIONS LISTED ON YOUR MEDICATION LIST TO YOU DURING YOUR ADMISSION IN THE HOSPITAL!    Patients discharged on the day of surgery will not be allowed to drive home.  Someone NEEDS to stay with you for the first 24 hours after anesthesia.              Please read over the following fact sheets you were given: IF YOU HAVE QUESTIONS ABOUT YOUR PRE-OP INSTRUCTIONS PLEASE CALL [phone] Contras    If you received a COVID test during your pre-op visit   it is requested that you wear a mask when out in public, stay away from anyone that may not be feeling well and notify your surgeon if you develop symptoms. If you test positive for Covid or have been in contact with anyone that has tested positive in the last 10 days please notify you surgeon.    [facility] - Preparing for Surgery Before surgery, you can play an important role.  Because skin is not sterile, your skin needs to be as free of germs as possible.  You can reduce the number of germs on your skin by washing with CHG (chlorahexidine gluconate) soap before surgery.  CHG is an antiseptic cleaner which kills germs and bonds with the skin to continue killing germs even after washing. Please DO NOT use if you have an allergy to CHG or antibacterial soaps.  If your skin becomes reddened/irritated stop using the CHG and inform your nurse when you arrive at Short Stay. Do not shave (including legs and underarms) for at least 48 hours prior to the first CHG shower.  You may shave your face/neck.  Please follow these instructions carefully:  1.  Shower with CHG Soap the night before surgery and the  morning of surgery.  2.  If you choose to wash your hair, wash your hair first as usual with your normal  shampoo.  3.  After you shampoo, rinse your hair and body thoroughly to remove the shampoo.  4.  Use CHG as you would any other liquid soap.  You can apply chg directly to the skin and wash.  Gently with a scrungie or clean washcloth.  5.  Apply the CHG Soap to your body ONLY FROM THE NECK DOWN.   Do   not use on face/ open                           Wound or open sores. Avoid contact with eyes, ears mouth and   genitals (private parts).                       Wash face,  Genitals (private parts) with your normal soap.             6.  Wash thoroughly, paying special attention to the area where your    surgery  will be performed.  7.  Thoroughly rinse your body with warm water  from the neck down.  8.  DO NOT shower/wash with your normal soap after using and rinsing off the CHG Soap.                9.  Pat yourself dry with a clean towel.            10.  Wear clean pajamas.            11.  Place clean sheets on your bed the night of your first shower and do not  sleep with pets. Day of Surgery : Do not apply any lotions/deodorants the morning of surgery.  Please wear clean clothes to the hospital/surgery center.  FAILURE TO FOLLOW THESE INSTRUCTIONS MAY RESULT IN THE CANCELLATION OF YOUR SURGERY  PATIENT SIGNATURE_________________________________  NURSE SIGNATURE__________________________________  ________________________________________________________________________

## 2023-12-31 NOTE — Progress Notes (Signed)
 Patient interviewed over the phone. Emailed instructions. Transferring call to admitting.  COVID Vaccine Completed: yes  Date of COVID positive in last 90 days: no  PCP - Mordecai Maes, NP Cardiologist - years ago for heart murmur  Chest x-ray - n/a EKG - 12/11/23 Epic/chart Stress Test - 06/30/15 [facility] ECHO - 12/25/18 [facility] Cardiac Cath - n/a Pacemaker/ICD device last checked: n/a Spinal Cord Stimulator: n/a  Bowel Prep - no  Sleep Study - yes CPAP - most nights  Fasting Blood Sugar - n/a Checks Blood Sugar _____ times a day  Last dose of GLP1 agonist-  Zepbound, takes Sundays GLP1 instructions:  Hold 7 days before surgery. Last dose 12/23/23   Last dose of SGLT-2 inhibitors-  N/A SGLT-2 instructions:  Hold 3 days before surgery    Blood Thinner Instructions:  Last dose: n/a  Time: Aspirin Instructions: Last Dose:  Activity level: Can go up a flight of stairs and perform activities of daily living without stopping and without symptoms of chest pain or shortness of breath.  Anesthesia review: OSA, heart murmur  Patient denies shortness of breath, fever, cough and chest pain at PAT appointment  Patient verbalized understanding of instructions that were given to them at the PAT appointment. Patient was also instructed that they will need to review over the PAT instructions again at home before surgery.

## 2024-01-01 ENCOUNTER — Encounter (HOSPITAL_COMMUNITY): Payer: Self-pay

## 2024-01-01 ENCOUNTER — Encounter (HOSPITAL_COMMUNITY)
Admission: RE | Admit: 2024-01-01 | Discharge: 2024-01-01 | Disposition: A | Discharge status: Home / Self Care | Payer: 59 | Source: Ambulatory Visit | Attending: Surgery | Admitting: Surgery

## 2024-01-01 ENCOUNTER — Other Ambulatory Visit: Payer: Self-pay

## 2024-01-02 NOTE — Progress Notes (Signed)
 Anesthesia Chart Review   Case: [id number] Date/Time: 01/07/24 0945   Procedures:      LAPAROSCOPIC CHOLECYSTECTOMY WITH INTRAOPERATIVE CHOLANGIOGRAM     LAPAROSCOPY, DIAGNOSTIC   Anesthesia type: General   Pre-op diagnosis:      CHRONIC CHOLECYSTITIS     APPENDIX HYPERPLASIA   Location: [facility] ROOM 01 / [facility]   Surgeons: Darnell Level, MD       DISCUSSION:53 y.o. smoker with h/o sleep apnea, chronic cholecystitis, appendix hyperplasia scheduled for above procedure 01/07/2024 with Dr. Darnell Level.   Pt seen by cardiology in 2020 for evaluation of heart murmur. Per notes cardiologist did not hear a murmur on exam.  Echo ordered with left ventricle cavity is normal in size, normal wall motion, EF 60-65%.  VS: Ht 5\' 6"  (1.676 m)   Wt 104.3 kg   LMP 03/23/2018   BMI 37.12 kg/m   PROVIDERS: Eden Emms, NP is PCP    LABS: Labs reviewed: Acceptable for surgery. (all labs ordered are listed, but only abnormal results are displayed)  Labs Reviewed - No data to display   IMAGES:   EKG:   CV:  Past Medical History:  Diagnosis Date   Allergy    Alopecia    Arthritis    Chicken pox    Constipation    Fatty liver    GERD (gastroesophageal reflux disease)    Heart murmur    Joint pain    Sleep apnea    Vitamin D deficiency     Past Surgical History:  Procedure Laterality Date   CERVICAL BIOPSY  W/ LOOP ELECTRODE EXCISION  12/22/2011   COLONOSCOPY     ESOPHAGOGASTRODUODENOSCOPY      MEDICATIONS:  acidophilus (RISAQUAD) CAPS capsule   Cholecalciferol (VITAMIN D) 50 MCG (2000 UT) CAPS   famotidine (PEPCID) 20 MG tablet   fexofenadine (ALLEGRA) 180 MG tablet   fluticasone (CUTIVATE) 0.05 % cream   Glucosamine Sulfate (SYNOVACIN PO)   Iron-Vitamin C (VITRON-C PO)   ketoconazole (NIZORAL) 2 % cream   meloxicam (MOBIC) 15 MG tablet   Multiple Vitamin (MULTIVITAMIN WITH MINERALS) TABS tablet   NON FORMULARY   tacrolimus (PROTOPIC) 0.1 % ointment   tirzepatide  (ZEPBOUND) 5 MG/0.5ML Pen   Turmeric 500 MG CAPS   No current facility-administered medications for this encounter.     Jodell Cipro Ward, PA-C [facility] Pre-Surgical Testing [phone]

## 2024-01-06 ENCOUNTER — Encounter (HOSPITAL_COMMUNITY): Payer: Self-pay | Admitting: Surgery

## 2024-01-06 DIAGNOSIS — K380 Hyperplasia of appendix: Secondary | ICD-10-CM | POA: Diagnosis present

## 2024-01-06 NOTE — H&P (Signed)
 REFERRING PHYSICIAN: Rayburn, Fanny Bien, PA-C  PROVIDER: Jacques Fife Myra Rude, MD   Chief Complaint: New Consultation (Symptomatic gallstones, prominent appendix on CT)  History of Present Illness:  Patient is referred by Lazaro Arms[facility] PA-C[facility] evaluation and recommendations regarding symptomatic cholelithiasis and a prominent appendix as seen on CT scan. Patient is in the healthy weight program. She has been on Wegovy. She has lost a significant amount of weight. Patient is having intermittent episodes of abdominal discomfort associated with nausea and diarrhea. Patient denies any history of jaundice or acholic stools. She has no history of a Pado biliary or pancreatic disease although she has been told she has fatty liver. Patient underwent CT scan in December 2024. This demonstrated cholelithiasis without signs of acute cholecystitis. Also noted was mild prominence of the appendix measuring 8 mm in diameter. There is no family history of gallbladder disease. Patient has had no prior abdominal surgery. Patient presents today to discuss cholecystectomy for treatment of symptomatic cholelithiasis and to consider evaluation of the appendix at the time of laparoscopy.  Review of Systems: A complete review of systems was obtained from the patient. I have reviewed this information and discussed as appropriate with the patient. See HPI as well for other ROS.  Review of Systems  Constitutional: Negative.  HENT: Negative.  Eyes: Negative.  Respiratory: Negative.  Cardiovascular: Negative.  Gastrointestinal: Positive for abdominal pain, diarrhea and nausea.  Genitourinary: Negative.  Musculoskeletal: Negative.  Skin: Negative.  Neurological: Negative.  Endo/Heme/Allergies: Negative.  Psychiatric/Behavioral: Negative.    Medical History: Past Medical History:  Diagnosis Date  Liver disease  Sleep apnea   Patient Active Problem List   Diagnosis  Calculus of gallbladder with chronic cholecystitis without obstruction  Hyperplasia of appendix   Past Surgical History:  Procedure Laterality Date  CERVICAL BIOPSY W/ LOOP ELECTRODE EXCISION    No Known Allergies  Current Outpatient Medications on File Prior to Visit  Medication Sig Dispense Refill  ELDERBERRY FRUIT ORAL Take by mouth  famotidine (PEPCID) 20 MG tablet  meloxicam (MOBIC) 15 MG tablet Take 1 tablet by mouth once daily  multivitamin with iron (MULTI COMPLETE WITH IRON) tablet Take by mouth  ZEPBOUND 5 mg/0.5 mL pen injector Inject 5 mg subcutaneously   No current facility-administered medications on file prior to visit.   Family History  Problem Relation Age of Onset  High blood pressure (Hypertension) Mother  Coronary Artery Disease (Blocked arteries around heart) Mother  Diabetes Mother  Diabetes Father  Obesity Brother  High blood pressure (Hypertension) Brother  Hyperlipidemia (Elevated cholesterol) Brother  Diabetes Brother    Social History   Tobacco Use  Smoking Status Never  Smokeless Tobacco Never    Social History   Socioeconomic History  Marital status: Single  Tobacco Use  Smoking status: Never  Smokeless tobacco: Never  Substance and Sexual Activity  Alcohol use: Yes  Drug use: Never   Social Drivers of Health   Received from [employer]  Social Network  Housing Stability: Unknown (11/22/2023)  Housing Stability Vital Sign  Homeless in the Last Year: No   Objective:   Vitals:  BP: 116/78  Pulse: 88  Temp: 36.7 C (98.1 F)  SpO2: 98%  Weight: (!) 105.7 kg (233 lb)  Height: 167.6 cm (5\' 6" )  PainSc: 0-No pain   Body mass index is 37.61 kg/m.  Physical Exam   GENERAL APPEARANCE Comfortable, no acute issues Development: normal Gross deformities: none  SKIN Rash, lesions, ulcers: none Induration, erythema: none Nodules: none palpable  EYES Conjunctiva and lids: normal Pupils: equal  EARS,  NOSE, MOUTH, THROAT External ears: no lesion or deformity External nose: no lesion or deformity Hearing: grossly normal  CHEST/CV Not assessed  ABDOMEN Abdomen is soft without distention. There are no surgical incisions. Palpation reveals no masses and no tenderness. There is no hepatosplenomegaly.  GENITOURINARY/RECTAL Not assessed  MUSCULOSKELETAL Station and gait: normal Digits and nails: no clubbing or cyanosis Muscle strength: grossly normal all extremities Deformity: none  PSYCHIATRIC Oriented to person, place, and time: yes Mood and affect: normal for situation Judgment and insight: appropriate for situation   Assessment and Plan:   Calculus of gallbladder with chronic cholecystitis without obstruction Hyperplasia of appendix  Patient is referred by Lazaro Arms, PA, for surgical evaluation and management of symptomatic cholelithiasis, chronic cholecystitis, and prominence of the appendix. Patient is provided with written literature on gallbladder surgery to review at home.  Today we reviewed her clinical history. We discussed her symptoms. We discussed the typical symptoms of biliary colic. We reviewed the CT scan findings. We discussed proceeding with laparoscopic cholecystectomy with intraoperative cholangiography. We discussed the size and location of the surgical incisions. We discussed the risk and benefits including the risk of conversion to open surgery. We discussed evaluating the appendix at the same time as cholecystectomy through the laparoscope. The plan would be to leave the appendix alone if everything appears to be normal. If there is any significant abnormality of the appendix, we would consider proceeding with appendectomy at the time of surgery. We discussed doing this as an outpatient surgical procedure. We discussed her postoperative recovery and return to work and activities. The patient understands and wishes to proceed in the near future.   Darnell Level, MD [facility] practice Office: [phone]

## 2024-01-07 ENCOUNTER — Ambulatory Visit (HOSPITAL_COMMUNITY)

## 2024-01-07 ENCOUNTER — Ambulatory Visit (HOSPITAL_COMMUNITY)
Admission: RE | Admit: 2024-01-07 | Discharge: 2024-01-07 | Disposition: A | Discharge status: Home / Self Care | Payer: 59 | Attending: Surgery | Admitting: Surgery

## 2024-01-07 ENCOUNTER — Ambulatory Visit (HOSPITAL_BASED_OUTPATIENT_CLINIC_OR_DEPARTMENT_OTHER): Admitting: Anesthesiology

## 2024-01-07 ENCOUNTER — Other Ambulatory Visit: Payer: Self-pay

## 2024-01-07 ENCOUNTER — Ambulatory Visit (HOSPITAL_COMMUNITY): Payer: Self-pay | Admitting: Physician Assistant

## 2024-01-07 ENCOUNTER — Encounter (HOSPITAL_COMMUNITY): Payer: Self-pay | Admitting: Surgery

## 2024-01-07 ENCOUNTER — Encounter (HOSPITAL_COMMUNITY)
Admission: RE | Disposition: A | Discharge status: Home / Self Care | Payer: Self-pay | Source: Home / Self Care | Attending: Surgery

## 2024-01-07 DIAGNOSIS — E66813 Obesity, class 3: Secondary | ICD-10-CM | POA: Diagnosis not present

## 2024-01-07 DIAGNOSIS — K380 Hyperplasia of appendix: Secondary | ICD-10-CM | POA: Diagnosis not present

## 2024-01-07 DIAGNOSIS — K8010 Calculus of gallbladder with chronic cholecystitis without obstruction: Secondary | ICD-10-CM | POA: Diagnosis present

## 2024-01-07 DIAGNOSIS — K760 Fatty (change of) liver, not elsewhere classified: Secondary | ICD-10-CM | POA: Insufficient documentation

## 2024-01-07 DIAGNOSIS — K8020 Calculus of gallbladder without cholecystitis without obstruction: Secondary | ICD-10-CM | POA: Diagnosis present

## 2024-01-07 DIAGNOSIS — K219 Gastro-esophageal reflux disease without esophagitis: Secondary | ICD-10-CM | POA: Insufficient documentation

## 2024-01-07 DIAGNOSIS — G4730 Sleep apnea, unspecified: Secondary | ICD-10-CM | POA: Insufficient documentation

## 2024-01-07 DIAGNOSIS — Z6837 Body mass index (BMI) 37.0-37.9, adult: Secondary | ICD-10-CM | POA: Diagnosis not present

## 2024-01-07 HISTORY — PX: CHOLECYSTECTOMY: SHX55

## 2024-01-07 HISTORY — PX: LAPAROSCOPY: SHX197

## 2024-01-07 SURGERY — LAPAROSCOPIC CHOLECYSTECTOMY WITH INTRAOPERATIVE CHOLANGIOGRAM
Anesthesia: General | Site: Abdomen

## 2024-01-07 MED ORDER — FENTANYL CITRATE (PF) 100 MCG/2ML IJ SOLN
INTRAMUSCULAR | Status: DC | PRN
  Administered 2024-01-07: 50 ug via INTRAVENOUS
  Administered 2024-01-07: 100 ug via INTRAVENOUS

## 2024-01-07 MED ORDER — MIDAZOLAM HCL 2 MG/2ML IJ SOLN
INTRAMUSCULAR | Status: AC
  Filled 2024-01-07: qty 2

## 2024-01-07 MED ORDER — LACTATED RINGERS IV SOLN: INTRAVENOUS | Status: DC

## 2024-01-07 MED ORDER — AMISULPRIDE (ANTIEMETIC) 5 MG/2ML IV SOLN: 10.0000 mg | Freq: Once | INTRAVENOUS | Status: DC | PRN

## 2024-01-07 MED ORDER — MEPERIDINE HCL 50 MG/ML IJ SOLN: 6.2500 mg | INTRAMUSCULAR | Status: DC | PRN

## 2024-01-07 MED ORDER — HYDROMORPHONE HCL 1 MG/ML IJ SOLN
INTRAMUSCULAR | Status: AC
  Filled 2024-01-07: qty 1

## 2024-01-07 MED ORDER — FENTANYL CITRATE (PF) 100 MCG/2ML IJ SOLN
INTRAMUSCULAR | Status: AC
  Filled 2024-01-07: qty 2

## 2024-01-07 MED ORDER — KETOROLAC TROMETHAMINE 30 MG/ML IJ SOLN
30.0000 mg | Freq: Once | INTRAMUSCULAR | Status: AC
Start: 2024-01-07 — End: 2024-01-07

## 2024-01-07 MED ORDER — MIDAZOLAM HCL 2 MG/2ML IJ SOLN
INTRAMUSCULAR | Status: DC | PRN
Start: 2024-01-07 — End: 2024-01-07
  Administered 2024-01-07: 2 mg via INTRAVENOUS

## 2024-01-07 MED ORDER — ONDANSETRON HCL 4 MG/2ML IJ SOLN
INTRAMUSCULAR | Status: AC
  Filled 2024-01-07: qty 2

## 2024-01-07 MED ORDER — BUPIVACAINE HCL (PF) 0.5 % IJ SOLN
INTRAMUSCULAR | Status: AC
  Filled 2024-01-07: qty 30

## 2024-01-07 MED ORDER — CEFAZOLIN SODIUM-DEXTROSE 2-4 GM/100ML-% IV SOLN
2.0000 g | INTRAVENOUS | Status: AC
  Administered 2024-01-07: 2 g via INTRAVENOUS
  Filled 2024-01-07: qty 100

## 2024-01-07 MED ORDER — CHLORHEXIDINE GLUCONATE 0.12 % MT SOLN
15.0000 mL | Freq: Once | OROMUCOSAL | Status: AC
  Administered 2024-01-07: 15 mL via OROMUCOSAL

## 2024-01-07 MED ORDER — OXYCODONE HCL 5 MG PO TABS
5.0000 mg | ORAL_TABLET | Freq: Once | ORAL | Status: AC | PRN
  Administered 2024-01-07: 5 mg via ORAL

## 2024-01-07 MED ORDER — ROCURONIUM BROMIDE 100 MG/10ML IV SOLN
INTRAVENOUS | Status: DC | PRN
  Administered 2024-01-07: 20 mg via INTRAVENOUS
  Administered 2024-01-07: 60 mg via INTRAVENOUS

## 2024-01-07 MED ORDER — 0.9 % SODIUM CHLORIDE (POUR BTL) OPTIME
TOPICAL | Status: DC | PRN
  Administered 2024-01-07: 1000 mL

## 2024-01-07 MED ORDER — IOHEXOL 300 MG/ML  SOLN
INTRAMUSCULAR | Status: DC | PRN
  Administered 2024-01-07: 10 mL

## 2024-01-07 MED ORDER — BUPIVACAINE HCL (PF) 0.25 % IJ SOLN
INTRAMUSCULAR | Status: AC
  Filled 2024-01-07: qty 30

## 2024-01-07 MED ORDER — DEXAMETHASONE SODIUM PHOSPHATE 10 MG/ML IJ SOLN
INTRAMUSCULAR | Status: AC
Start: 2024-01-07 — End: ?
  Filled 2024-01-07: qty 1

## 2024-01-07 MED ORDER — CHLORHEXIDINE GLUCONATE CLOTH 2 % EX PADS: 6.0000 | MEDICATED_PAD | Freq: Once | CUTANEOUS | Status: DC

## 2024-01-07 MED ORDER — ROCURONIUM BROMIDE 10 MG/ML (PF) SYRINGE
PREFILLED_SYRINGE | INTRAVENOUS | Status: AC
  Filled 2024-01-07: qty 10

## 2024-01-07 MED ORDER — HYDROMORPHONE HCL 1 MG/ML IJ SOLN
0.2500 mg | INTRAMUSCULAR | Status: DC | PRN
  Administered 2024-01-07: 0.5 mg via INTRAVENOUS

## 2024-01-07 MED ORDER — PHENYLEPHRINE 80 MCG/ML (10ML) SYRINGE FOR IV PUSH (FOR BLOOD PRESSURE SUPPORT)
PREFILLED_SYRINGE | INTRAVENOUS | Status: AC
  Filled 2024-01-07: qty 10

## 2024-01-07 MED ORDER — LIDOCAINE HCL (PF) 2 % IJ SOLN
INTRAMUSCULAR | Status: AC
Start: 2024-01-07 — End: ?
  Filled 2024-01-07: qty 5

## 2024-01-07 MED ORDER — BUPIVACAINE-EPINEPHRINE (PF) 0.5% -1:200000 IJ SOLN
INTRAMUSCULAR | Status: AC
  Filled 2024-01-07: qty 30

## 2024-01-07 MED ORDER — ONDANSETRON HCL 4 MG/2ML IJ SOLN
INTRAMUSCULAR | Status: DC | PRN
  Administered 2024-01-07: 4 mg via INTRAVENOUS

## 2024-01-07 MED ORDER — 0.9 % SODIUM CHLORIDE (POUR BTL) OPTIME: TOPICAL | Status: DC | PRN

## 2024-01-07 MED ORDER — PROPOFOL 10 MG/ML IV BOLUS
INTRAVENOUS | Status: DC | PRN
Start: 2024-01-07 — End: 2024-01-07
  Administered 2024-01-07: 50 mg via INTRAVENOUS
  Administered 2024-01-07: 150 mg via INTRAVENOUS

## 2024-01-07 MED ORDER — KETOROLAC TROMETHAMINE 30 MG/ML IJ SOLN
INTRAMUSCULAR | Status: AC
  Administered 2024-01-07: 30 mg via INTRAVENOUS
  Filled 2024-01-07: qty 1

## 2024-01-07 MED ORDER — ACETAMINOPHEN 10 MG/ML IV SOLN
INTRAVENOUS | Status: AC
  Administered 2024-01-07: 1000 mg via INTRAVENOUS
  Filled 2024-01-07: qty 100

## 2024-01-07 MED ORDER — PROPOFOL 10 MG/ML IV BOLUS
INTRAVENOUS | Status: AC
  Filled 2024-01-07: qty 20

## 2024-01-07 MED ORDER — SUGAMMADEX SODIUM 200 MG/2ML IV SOLN
INTRAVENOUS | Status: AC
  Filled 2024-01-07: qty 2

## 2024-01-07 MED ORDER — TRAMADOL HCL 50 MG PO TABS: 50.0000 mg | ORAL_TABLET | Freq: Four times a day (QID) | ORAL | 0 refills | Status: DC | PRN

## 2024-01-07 MED ORDER — ORAL CARE MOUTH RINSE: 15.0000 mL | Freq: Once | OROMUCOSAL | Status: AC

## 2024-01-07 MED ORDER — LACTATED RINGERS IR SOLN
Status: DC | PRN
Start: 2024-01-07 — End: 2024-01-07
  Administered 2024-01-07: 1000 mL

## 2024-01-07 MED ORDER — SODIUM CHLORIDE 0.9 % IV SOLN: 12.5000 mg | INTRAVENOUS | Status: DC | PRN

## 2024-01-07 MED ORDER — ACETAMINOPHEN 10 MG/ML IV SOLN: 1000.0000 mg | Freq: Four times a day (QID) | INTRAVENOUS | Status: DC

## 2024-01-07 MED ORDER — OXYCODONE HCL 5 MG/5ML PO SOLN: 5.0000 mg | Freq: Once | ORAL | Status: AC | PRN

## 2024-01-07 MED ORDER — BUPIVACAINE-EPINEPHRINE (PF) 0.25% -1:200000 IJ SOLN
INTRAMUSCULAR | Status: AC
  Filled 2024-01-07: qty 30

## 2024-01-07 MED ORDER — SUGAMMADEX SODIUM 200 MG/2ML IV SOLN
INTRAVENOUS | Status: DC | PRN
Start: 2024-01-07 — End: 2024-01-07
  Administered 2024-01-07: 200 mg via INTRAVENOUS

## 2024-01-07 MED ORDER — LIDOCAINE HCL (PF) 2 % IJ SOLN
INTRAMUSCULAR | Status: DC | PRN
  Administered 2024-01-07: 100 mg via INTRADERMAL

## 2024-01-07 MED ORDER — DEXAMETHASONE SODIUM PHOSPHATE 10 MG/ML IJ SOLN
INTRAMUSCULAR | Status: DC | PRN
  Administered 2024-01-07: 8 mg via INTRAVENOUS

## 2024-01-07 MED ORDER — OXYCODONE HCL 5 MG PO TABS
ORAL_TABLET | ORAL | Status: AC
  Filled 2024-01-07: qty 1

## 2024-01-07 MED ORDER — BUPIVACAINE-EPINEPHRINE 0.25% -1:200000 IJ SOLN
INTRAMUSCULAR | Status: DC | PRN
  Administered 2024-01-07: 30 mL

## 2024-01-07 SURGICAL SUPPLY — 36 items
APPLIER CLIP ROT 10 11.4 M/L (STAPLE) ×2 IMPLANT
BAG COUNTER SPONGE SURGICOUNT (BAG) IMPLANT
CABLE HIGH FREQUENCY MONO STRZ (ELECTRODE) ×2 IMPLANT
CHLORAPREP W/TINT 26 (MISCELLANEOUS) ×4 IMPLANT
CLIP APPLIE ROT 10 11.4 M/L (STAPLE) ×2 IMPLANT
COVER MAYO STAND XLG (MISCELLANEOUS) ×2 IMPLANT
COVER SURGICAL LIGHT HANDLE (MISCELLANEOUS) ×2 IMPLANT
DERMABOND ADVANCED .7 DNX12 (GAUZE/BANDAGES/DRESSINGS) IMPLANT
DRAPE C-ARM 42X120 X-RAY (DRAPES) ×2 IMPLANT
ELECT REM PT RETURN 15FT ADLT (MISCELLANEOUS) ×2 IMPLANT
GAUZE SPONGE 2X2 8PLY STRL LF (GAUZE/BANDAGES/DRESSINGS) ×2 IMPLANT
GLOVE SURG ORTHO 8.0 STRL STRW (GLOVE) ×2 IMPLANT
GLOVE SURG SYN 7.5 E (GLOVE) IMPLANT
GLOVE SURG SYN 7.5 PF PI (GLOVE) ×4 IMPLANT
GOWN STRL REUS W/ TWL XL LVL3 (GOWN DISPOSABLE) ×2 IMPLANT
IRRIG SUCT STRYKERFLOW 2 WTIP (MISCELLANEOUS) ×2 IMPLANT
IRRIGATION SUCT STRKRFLW 2 WTP (MISCELLANEOUS) ×2 IMPLANT
KIT BASIN OR (CUSTOM PROCEDURE TRAY) ×2 IMPLANT
KIT TURNOVER KIT A (KITS) IMPLANT
SCISSORS LAP 5X35 DISP (ENDOMECHANICALS) ×2 IMPLANT
SET CHOLANGIOGRAPH MIX (MISCELLANEOUS) ×2 IMPLANT
SET TUBE SMOKE EVAC HIGH FLOW (TUBING) ×2 IMPLANT
SHEARS HARMONIC 36 ACE (MISCELLANEOUS) IMPLANT
SLEEVE Z-THREAD 5X100MM (TROCAR) ×2 IMPLANT
SPIKE FLUID TRANSFER (MISCELLANEOUS) ×2 IMPLANT
SUT MNCRL AB 4-0 PS2 18 (SUTURE) ×2 IMPLANT
SYS BAG RETRIEVAL 10MM (BASKET) ×2 IMPLANT
SYSTEM BAG RETRIEVAL 10MM (BASKET) ×2 IMPLANT
TOWEL OR 17X26 10 PK STRL BLUE (TOWEL DISPOSABLE) ×2 IMPLANT
TRAY FOLEY MTR SLVR 16FR STAT (SET/KITS/TRAYS/PACK) IMPLANT
TRAY LAPAROSCOPIC (CUSTOM PROCEDURE TRAY) ×2 IMPLANT
TROCAR 11X100 Z THREAD (TROCAR) ×2 IMPLANT
TROCAR BALLN 12MMX100 BLUNT (TROCAR) ×2 IMPLANT
TROCAR Z-THREAD OPTICAL 5X100M (TROCAR) ×2 IMPLANT
TROCAR Z-THREAD SLEEVE 11X100 (TROCAR) IMPLANT
WATER STERILE IRR 1000ML POUR (IV SOLUTION) ×2 IMPLANT

## 2024-01-07 NOTE — Interval H&P Note (Signed)
 History and Physical Interval Note:  01/07/2024 9:29 AM  Brandi Ortiz  has presented today for surgery, with the diagnosis of CHRONIC CHOLECYSTITIS APPENDIX HYPERPLASIA.  The various methods of treatment have been discussed with the patient and family. After consideration of risks, benefits and other options for treatment, the patient has consented to    Procedure(s): LAPAROSCOPIC CHOLECYSTECTOMY WITH INTRAOPERATIVE CHOLANGIOGRAM (N/A) LAPAROSCOPY, DIAGNOSTIC (N/A) as a surgical intervention.    The patient's history has been reviewed, patient examined, no change in status, stable for surgery.  I have reviewed the patient's chart and labs.  Questions were answered to the patient's satisfaction.    Darnell Level, MD [facility] practice Office: [phone]   Darnell Level

## 2024-01-07 NOTE — Anesthesia Preprocedure Evaluation (Signed)
 Anesthesia Evaluation  Patient identified by MRN, date of birth, ID band Patient awake    Reviewed: Allergy & Precautions, H&P , NPO status , Patient's Chart, lab work & pertinent test results  Airway Mallampati: II  TM Distance: >3 FB Neck ROM: Full    Dental no notable dental hx.    Pulmonary sleep apnea , Current Smoker and Patient abstained from smoking.   Pulmonary exam normal breath sounds clear to auscultation       Cardiovascular negative cardio ROS Normal cardiovascular exam Rhythm:Regular Rate:Normal     Neuro/Psych  Headaches  negative psych ROS   GI/Hepatic Neg liver ROS,GERD  ,,  Endo/Other    Class 3 obesity  Renal/GU negative Renal ROS  negative genitourinary   Musculoskeletal  (+) Arthritis , Osteoarthritis,    Abdominal  (+) + obese  Peds negative pediatric ROS (+)  Hematology negative hematology ROS (+)   Anesthesia Other Findings   Reproductive/Obstetrics negative OB ROS                             Anesthesia Physical Anesthesia Plan  ASA: 2  Anesthesia Plan: General   Post-op Pain Management:    Induction: Intravenous  PONV Risk Score and Plan: 2 and Ondansetron, Midazolam and Treatment may vary due to age or medical condition  Airway Management Planned: Oral ETT  Additional Equipment:   Intra-op Plan:   Post-operative Plan: Extubation in OR  Informed Consent: I have reviewed the patients History and Physical, chart, labs and discussed the procedure including the risks, benefits and alternatives for the proposed anesthesia with the patient or authorized representative who has indicated his/her understanding and acceptance.     Dental advisory given  Plan Discussed with: CRNA  Anesthesia Plan Comments:        Anesthesia Quick Evaluation

## 2024-01-07 NOTE — Anesthesia Procedure Notes (Signed)
 Procedure Name: Intubation Date/Time: 01/07/2024 10:10 AM  Performed by: Micki Riley, CRNAPre-anesthesia Checklist: Patient identified, Emergency Drugs available, Suction available and Patient being monitored Patient Re-evaluated:Patient Re-evaluated prior to induction Oxygen Delivery Method: Circle System Utilized Preoxygenation: Pre-oxygenation with 100% oxygen Induction Type: IV induction Ventilation: Mask ventilation without difficulty Laryngoscope Size: Miller and 2 Grade View: Grade I Tube type: Oral Tube size: 7.0 mm Number of attempts: 1 Airway Equipment and Method: Stylet and Oral airway Placement Confirmation: ETT inserted through vocal cords under direct vision, positive ETCO2 and breath sounds checked- equal and bilateral Tube secured with: Tape Dental Injury: Teeth and Oropharynx as per pre-operative assessment

## 2024-01-07 NOTE — Op Note (Signed)
 Procedure Note  Pre-operative Diagnosis:  chronic cholecystitis, cholelithiasis, appendiceal hyperplasia  Post-operative Diagnosis:  chronic cholecystitis, cholelithiasis, normal appendix  Surgeon:  Darnell Level, MD  Assistant:  Saunders Glance, PA-C   Procedure:  Laparoscopic cholecystectomy with intra-operative cholangiography; diagnostic laparoscopy with evaluation of appendix (photos recorded)  Anesthesia:  General  Estimated Blood Loss:  minimal  Drains: none         Specimen: gallbladder to pathology  Indications:  Patient is referred by Lazaro Arms, PA-C[facility] evaluation and recommendations regarding symptomatic cholelithiasis and a prominent appendix as seen on CT scan. Patient is in the healthy weight program. She has been on Wegovy. She has lost a significant amount of weight. Patient is having intermittent episodes of abdominal discomfort associated with nausea and diarrhea. Patient denies any history of jaundice or acholic stools. She has no history of a Pado biliary or pancreatic disease although she has been told she has fatty liver. Patient underwent CT scan in December 2024. This demonstrated cholelithiasis without signs of acute cholecystitis. Also noted was mild prominence of the appendix measuring 8 mm in diameter. There is no family history of gallbladder disease. Patient has had no prior abdominal surgery. Patient presents today to discuss cholecystectomy for treatment of symptomatic cholelithiasis and to consider evaluation of the appendix at the time of laparoscopy.   Procedure description: The patient was seen in the pre-op holding area. The risks, benefits, complications, treatment options, and expected outcomes were previously discussed with the patient. The patient agreed with the proposed plan and has signed the informed consent form.  The patient was transported to operating room #1 at the [facility]. The patient was  placed in the supine position on the operating room table. Following induction of general anesthesia, the abdomen was prepped and draped in the usual aseptic fashion.  An incision was made in the skin near the umbilicus. The midline fascia was incised and the peritoneal cavity was entered and a Hasson cannula was introduced under direct vision. The cannula was secured with a 0-Vicryl pursestring suture. Pneumoperitoneum was established with carbon dioxide. Additional cannulae were introduced under direct vision along the right costal margin in the midline, mid-clavicular line, and anterior axillary line.  Prior to beginning the dissection of the gallbladder, diagnostic laparoscopy was performed.  Small bowel appeared grossly normal.  Ascending colon was normal.  Cecum was mobilized and a normal appendix was identified.  Representative photographs were taken.  No abnormality was identified.   The gallbladder was identified and the fundus grasped and retracted cephalad. Adhesions were taken down bluntly and the electrocautery was utilized as needed, taking care not to involve any adjacent structures. The infundibulum was grasped and retracted laterally, exposing the peritoneum overlying the triangle of Calot. The peritoneum was incised and structures exposed with blunt dissection. The cystic duct was clearly identified, bluntly dissected circumferentially, and clipped at the neck of the gallbladder.  An incision was made in the cystic duct and the cholangiogram catheter introduced. The catheter was secured using an ligaclip.  Real-time cholangiography was performed using C-arm fluoroscopy.  There was rapid filling of a normal caliber common bile duct.  There was reflux of contrast into the left and right hepatic ductal systems.  There was free flow distally into the duodenum without filling defect or obstruction.  The catheter was removed from the peritoneal cavity.  The cystic duct was then ligated with  ligaclips and divided. The cystic artery was  identified, dissected circumferentially, ligated with ligaclips, and divided.  The gallbladder was dissected away from the gallbladder bed using the electrocautery for hemostasis. The gallbladder was completely removed from the liver and placed into an endocatch bag. The gallbladder was removed in the endocatch bag through the umbilical port site and submitted to pathology for review.  The right upper quadrant was irrigated and the gallbladder bed was inspected. Hemostasis was achieved with the electrocautery.  Cannulae were removed under direct vision and good hemostasis was noted. Pneumoperitoneum was released and the majority of the carbon dioxide evacuated. The umbilical wound was irrigated and the fascia was then closed with the pursestring suture.  Local anesthetic was infiltrated at all port sites. Skin incisions were closed with 4-0 Monocril subcuticular sutures and Dermabond was applied.  Instrument, sponge, and needle counts were correct at the conclusion of the case.  The patient was awakened from anesthesia and brought to the recovery room in stable condition.  The patient tolerated the procedure well.   Darnell Level, MD [facility] Office: [phone]

## 2024-01-07 NOTE — Addendum Note (Signed)
 Addendum  created 01/07/24 1514 by Micki Riley, CRNA   Flowsheet accepted

## 2024-01-07 NOTE — Discharge Instructions (Signed)
 [facility]  LAPAROSCOPIC SURGERY:  POST-OP INSTRUCTIONS  Always review your discharge instruction sheet given to you by the facility where your surgery was performed.  A prescription for pain medication may be given to you upon discharge.  Take your pain medication as prescribed.  If narcotic pain medicine is not needed, then you may take acetaminophen (Tylenol) or ibuprofen (Advil) as needed.  Take your usually prescribed medications unless otherwise directed.  If you need a refill on your pain medication, please contact your pharmacy.  They will contact our office to request authorization. Prescriptions will not be filled after 5 P.M. or on weekends.  You should follow a light diet the first few days after arrival home, such as soup and crackers or toast.  Be sure to include plenty of fluids daily.  Most patients will experience some swelling and bruising in the area of the incisions.  Ice packs will help.  Swelling and bruising can take several days to resolve.   It is common to experience some constipation after surgery.  Increasing fluid intake and taking a stool softener (such as Colace) will usually help or prevent this problem from occurring.  A mild laxative (Milk of Magnesia or Miralax) should be taken according to package instructions if there has been no bowel movement after 48 hours.  You will likely have Dermabond (topical glue) over your incisions.  This seals the incisions and allows you to bathe and shower at any time after your surgery.  Glue should remain in place for up to 10 days.  It may be removed after 10 days by pealing off the Dermabond material or using Vaseline or naval jelly to remove.  If you have steri-strips over your incisions, you may remove the gauze bandage on the second day after surgery, and you may shower at that time.  Leave your steri-strips (small skin tapes) in place directly over the incision.  These strips should remain on the  skin for 5-7 days and then be removed.  You may get them wet in the shower and pat them dry.  Any sutures or staples will be removed at the office during your follow-up visit.  ACTIVITIES:  You may resume regular (light) daily activities beginning the next day - such as daily self-care, walking, climbing stairs - gradually increasing activities as tolerated.  You may have sexual intercourse when it is comfortable.  Refrain from any heavy lifting or straining until approved by your doctor.  You may drive when you are no longer taking prescription pain medication, when you can comfortably wear a seatbelt, and when you can safely maneuver your car and apply brakes.  You should see your doctor in the office for a follow-up appointment approximately 2-3 weeks after your surgery.  Make sure that you call for this appointment within a day or two after you arrive home to insure a convenient appointment time.  WHEN TO CALL YOUR DOCTOR: Fever over 101.0 Inability to urinate Continued bleeding from incision Increased pain, redness, or drainage from the incision Increasing abdominal pain  The clinic staff is available to answer your questions during regular business hours.  Please don't hesitate to call and ask to speak to one of the nurses for clinical concerns.  If you have a medical emergency, go to the nearest emergency room or call 911.  A surgeon from [facility] is always on call for the hospital.  Darnell Level, MD [facility] Office: [phone] Toll Free:  [phone] FAX [phone]  Website: [email or web address]

## 2024-01-07 NOTE — Transfer of Care (Signed)
 Immediate Anesthesia Transfer of Care Note  Patient: Brandi Ortiz  Procedure(s) Performed: LAPAROSCOPIC CHOLECYSTECTOMY WITH INTRAOPERATIVE CHOLANGIOGRAM (Abdomen) LAPAROSCOPY, DIAGNOSTIC  Patient Location: PACU  Anesthesia Type:General  Level of Consciousness: sedated and responds to stimulation  Airway & Oxygen Therapy: Patient Spontanous Breathing and Patient connected to face mask oxygen  Post-op Assessment: Report given to RN and Post -op Vital signs reviewed and stable  Post vital signs: stable  Last Vitals:  Vitals Value Taken Time  BP 133/93 01/07/24 1135  Temp    Pulse 68 01/07/24 1137  Resp 14 01/07/24 1137  SpO2 100 % 01/07/24 1137  Vitals shown include unfiled device data.  Last Pain:  Vitals:   01/07/24 0812  TempSrc:   PainSc: 0-No pain         Complications: No notable events documented.

## 2024-01-07 NOTE — Anesthesia Postprocedure Evaluation (Signed)
 Anesthesia Post Note  Patient: Brandi Ortiz  Procedure(s) Performed: LAPAROSCOPIC CHOLECYSTECTOMY WITH INTRAOPERATIVE CHOLANGIOGRAM (Abdomen) LAPAROSCOPY, DIAGNOSTIC     Patient location during evaluation: PACU Anesthesia Type: General Level of consciousness: awake and alert Pain management: pain level controlled Vital Signs Assessment: post-procedure vital signs reviewed and stable Respiratory status: spontaneous breathing, nonlabored ventilation and respiratory function stable Cardiovascular status: blood pressure returned to baseline and stable Postop Assessment: no apparent nausea or vomiting Anesthetic complications: no   No notable events documented.  Last Vitals:  Vitals:   01/07/24 1243 01/07/24 1327  BP: (!) 137/95 126/81  Pulse: 69 69  Resp: 13 14  Temp: (!) 36.4 C   SpO2: 94% 98%    Last Pain:  Vitals:   01/07/24 1327  TempSrc:   PainSc: 2                  Lowella Curb

## 2024-01-08 ENCOUNTER — Telehealth (INDEPENDENT_AMBULATORY_CARE_PROVIDER_SITE_OTHER): Payer: Self-pay | Admitting: *Deleted

## 2024-01-08 ENCOUNTER — Encounter (HOSPITAL_COMMUNITY): Payer: Self-pay | Admitting: Surgery

## 2024-01-08 LAB — SURGICAL PATHOLOGY

## 2024-01-08 NOTE — Telephone Encounter (Signed)
 A request for prior authorization (Zepbound 5 mg/0.5 Ml pen) has been sent to plan today, waiting for response.

## 2024-01-08 NOTE — Telephone Encounter (Signed)
 Message from Plan An active PA is already on file with expiration date of 07/16/2024. Please wait to resubmit request within 60 days of that expiration date to obtain a PA renewal.

## 2024-01-15 NOTE — Progress Notes (Unsigned)
 SUBJECTIVE: Discussed the use of AI scribe software for clinical note transcription with the patient, who gave verbal consent to proceed.  Chief Complaint: Obesity  Interim History: She is up 4 lbs since last visit Down 33 lbs overall TBW loss of 14.9%  Had Lap Cholecystectomy by Dr. Gerrit Friends 01/07/24- Doing well post op.   Just restarted Zepbound 5 mg on 01/11/24  Brandi Ortiz is here to discuss her progress with her obesity treatment plan. She is on the Category 3 Plan and states she is following her eating plan approximately 40 % of the time. She states she is not exercising. Just underwent surgery.  Brandi Ortiz is a 53 year old female who presents for follow-up on her obesity treatment plan.  She was recently switched to Zepbound after having been on [facility] for the past year. She had a cholecystectomy 9 days ago and has experienced a weight gain of 4.5 pounds, attributed to decreased activity and dietary changes post-surgery.   She notes some appetite suppression with Zepbound 5 mg weekly, although she still has cravings, particularly for sweets.  She has a history of obstructive sleep apnea and continues to experience symptoms despite medication. Zepbound was recently started to aid in weight loss, which may improve her sleep apnea symptoms.  She underwent a cholecystectomy on January 07, 2024, due to symptomatic cholelithiasis without obstruction. Post-surgery, her bowel movements are regular now. She reports soreness around the belly button incision, which is still bruised and has Dermabond present.  Socially, she recently moved to a new apartment on the third floor, which involved significant physical activity. She is currently working part-time at [employer] and plans to return to work on January 23, 2024, after taking time off for surgery.   Pharmacotherapy: Reginal Lutes (started 10/2022- titrated to 1.7 mg weekly for last 6 months of 2024 with good result but insurance stopped  covering)   Switch to Zepbound 11/15/23-  OBJECTIVE: Visit Diagnoses: Problem List Items Addressed This Visit     NAFLD (nonalcoholic fatty liver disease)   Insulin resistance   Relevant Medications   tirzepatide 7.5 MG/0.5ML injection vial   OSA (obstructive sleep apnea) - Primary   Relevant Medications   tirzepatide 7.5 MG/0.5ML injection vial   BMI 36.0-36.9,adult   Other Visit Diagnoses       S/P laparoscopic cholecystectomy         Obesity (HCC)- Start BMI 41.8       Relevant Medications   tirzepatide 7.5 MG/0.5ML injection vial      Obesity She is undergoing management for obesity with GLP-1 receptor agonist/GIP medication, Zepbound with primary indication of OSA.  Comorbidities include  insulin resistance, and nonalcoholic fatty liver disease.  Post-surgical weight gain is attributed to decreased activity and dietary changes. Zepbound provides some appetite suppression, but cravings for sweets persist. Current dose is 5 mg, with plans to increase to 7.5 mg for enhanced appetite suppression, as she tolerates the current dose without dysphagia, dyspepsia, persistent hoarseness, abdominal pain, or N/V/Constipation or diarrhea.    - Continue Zepbound 5 mg for total of four weeks, then increase to Zepbound 7.5 mg weekly.  - Schedule follow-up visit in four weeks. - Plan for fasting labs at the second follow-up visit in ~ 8 weeks.   Obstructive Sleep Apnea Obstructive sleep apnea is a primary indication for Zepbound, which is expected to aid in weight loss and improve symptoms. - Continue Zepbound as prescribed for weight management and sleep apnea.  Insulin Resistance Last  fasting insulin was 17.4- not at goal, but significantly improved. A1c was 4.6- at goal. Polyphagia:No Medication(s): Zepbound 5.0 mg SQ weekly Lab Results  Component Value Date   HGBA1C 4.6 (L) 09/04/2023   HGBA1C 4.5 (L) 03/05/2023   HGBA1C 5.1 06/08/2022   HGBA1C 4.8 07/19/2021   HGBA1C 4.5 (L)  04/15/2018   Lab Results  Component Value Date   INSULIN 17.4 09/04/2023   INSULIN 41.6 (H) 03/05/2023   INSULIN 39.3 (H) 06/08/2022    Plan: Continue and increase dose Zepbound 7.5 mg SQ weekly Continue working on nutrition plan to decrease simple carbohydrates, increase lean proteins and exercise to promote weight loss, improve glycemic control and prevent progression to Type 2 diabetes.  Plan to recheck labs in ~ 8 weeks  Nonalcoholic Fatty Liver Disease Nonalcoholic fatty liver disease with elevated liver function tests. Weight management is crucial for this condition. Monitoring liver function tests will assess the impact of weight management on liver health. - Monitor liver function tests at the next lab evaluation in ~ 8 weeks.  The mainstay of treatment of NAFLD includes lifestyle modification to achieve weight loss, at least 7% of current body weight and she has met and exceeded this goal. Low carbohydrate diets can be beneficial in improving NAFLD liver histology. Additionally, exercise, even the absence of weight loss can have beneficial effects on the patient's metabolic profile and liver health. We recommend that their metabolic comorbidities be aggressively managed, as patients with NAFLD are at increased risk of coronary artery disease.   Cholelithiasis status post-cholecystectomy Status post-cholecystectomy on January 07, 2024, for symptomatic gallstones. Bowel movements are regular with medication. Surgical site is healing with expected soreness at the belly button incision. - Monitor surgical site for healing and signs of infection. - Continue current bowel regimen.  General Health Maintenance She is adjusting her living space, impacting dietary habits. Efforts to return to a regular eating plan and maintain physical activity are ongoing. Encouragement for a balanced diet and regular physical activity is important. - Encourage adherence to a balanced diet and regular  physical activity. Vitals Temp: 98.1 F (36.7 C) BP: 105/70 Pulse Rate: 92 SpO2: 98 %   Anthropometric Measurements Height: 5\' 6"  (1.676 m) Weight: 226 lb (102.5 kg) BMI (Calculated): 36.49 Weight at Last Visit: 222 lb Weight Lost Since Last Visit: 0 lb Weight Gained Since Last Visit: 4 lb Starting Weight: 222 lb Total Weight Loss (lbs): 33 lb (15 kg) Peak Weight: 270 lb   Body Composition  Body Fat %: 39.8 % Fat Mass (lbs): 90.2 lbs Muscle Mass (lbs): 129.6 lbs Total Body Water (lbs): 86.4 lbs Visceral Fat Rating : 11   No data recorded   ASSESSMENT AND PLAN:  Diet: Brandi Ortiz is currently in the action stage of change. As such, her goal is to continue with weight loss efforts. She has agreed to Category 3 Plan.  Exercise: Brandi Ortiz has been instructed to work up to a goal of 150 minutes of combined cardio and strengthening exercise per week for weight loss and overall health benefits.   Behavior Modification:  We discussed the following Behavioral Modification Strategies today: increasing lean protein intake, decreasing simple carbohydrates, increasing vegetables, increase H2O intake, increase high fiber foods, no skipping meals, meal planning and cooking strategies, avoiding temptations, and planning for success. We discussed various medication options to help Brandi Ortiz with her weight loss efforts and we both agreed to increase Zepbound to 7.5 mg weekly after completing 4 weeks at Zepbound 5  mg weekly and continue to work on nutritional and behavioral strategies to promote weight loss.  .  Return in about 4 weeks (around 02/13/2024).Marland Kitchen She was informed of the importance of frequent follow up visits to maximize her success with intensive lifestyle modifications for her multiple health conditions.  Attestation Statements:   Reviewed by clinician on day of visit: allergies, medications, problem list, medical history, surgical history, family history, social history, and  previous encounter notes.   Time spent on visit including pre-visit chart review and post-visit care and charting was 42 minutes.    Keithon Mccoin, PA-C

## 2024-01-16 ENCOUNTER — Ambulatory Visit (INDEPENDENT_AMBULATORY_CARE_PROVIDER_SITE_OTHER): Payer: 59 | Admitting: Physician Assistant

## 2024-01-16 ENCOUNTER — Encounter: Payer: Self-pay | Admitting: Nurse Practitioner

## 2024-01-16 ENCOUNTER — Encounter (INDEPENDENT_AMBULATORY_CARE_PROVIDER_SITE_OTHER): Payer: Self-pay | Admitting: Physician Assistant

## 2024-01-16 VITALS — BP 105/70 | HR 92 | Temp 98.1°F | Ht 66.0 in | Wt 226.0 lb

## 2024-01-16 DIAGNOSIS — E669 Obesity, unspecified: Secondary | ICD-10-CM

## 2024-01-16 DIAGNOSIS — G4733 Obstructive sleep apnea (adult) (pediatric): Secondary | ICD-10-CM | POA: Diagnosis not present

## 2024-01-16 DIAGNOSIS — E88819 Insulin resistance, unspecified: Secondary | ICD-10-CM | POA: Diagnosis not present

## 2024-01-16 DIAGNOSIS — E559 Vitamin D deficiency, unspecified: Secondary | ICD-10-CM

## 2024-01-16 DIAGNOSIS — Z6836 Body mass index (BMI) 36.0-36.9, adult: Secondary | ICD-10-CM

## 2024-01-16 DIAGNOSIS — K760 Fatty (change of) liver, not elsewhere classified: Secondary | ICD-10-CM | POA: Diagnosis not present

## 2024-01-16 DIAGNOSIS — Z9049 Acquired absence of other specified parts of digestive tract: Secondary | ICD-10-CM | POA: Diagnosis not present

## 2024-01-16 MED ORDER — TIRZEPATIDE-WEIGHT MANAGEMENT 7.5 MG/0.5ML ~~LOC~~ SOAJ
7.5000 mg | SUBCUTANEOUS | 0 refills | Status: DC
  Filled 2024-02-04: qty 2, 28d supply, fill #0

## 2024-02-01 ENCOUNTER — Other Ambulatory Visit (HOSPITAL_COMMUNITY): Payer: Self-pay

## 2024-02-04 ENCOUNTER — Other Ambulatory Visit (HOSPITAL_COMMUNITY): Payer: Self-pay

## 2024-02-05 ENCOUNTER — Encounter: Payer: Self-pay | Admitting: Nurse Practitioner

## 2024-02-05 ENCOUNTER — Other Ambulatory Visit (HOSPITAL_COMMUNITY): Payer: Self-pay

## 2024-02-05 NOTE — Telephone Encounter (Signed)
 I have done her physical within the year. We can schedule her for the PPD that is requested by her future employer

## 2024-02-06 NOTE — Telephone Encounter (Signed)
 Called patient reviewed all information. Both placement and reading appointment scheduled. She will bring ppw for us  to fill out at placement appointment.

## 2024-02-07 ENCOUNTER — Ambulatory Visit

## 2024-02-08 ENCOUNTER — Encounter (INDEPENDENT_AMBULATORY_CARE_PROVIDER_SITE_OTHER): Payer: Self-pay | Admitting: Physician Assistant

## 2024-02-11 ENCOUNTER — Encounter: Payer: Self-pay | Admitting: Nurse Practitioner

## 2024-02-11 NOTE — Progress Notes (Unsigned)
 PPD Placement note Brandi Ortiz, 53 y.o. female is here today for placement of PPD test Reason for PPD test: Employment  Pt taken PPD test before: yes Verified in allergy area and with patient that they are not allergic to the products PPD is made of (Phenol or Tween). Yes Is patient taking any oral or IV steroid medication now or have they taken it in the last month? no Has the patient ever received the BCG vaccine?: no Has the patient been in recent contact with anyone known or suspected of having active TB disease?: no      Date of exposure (if applicable):       Name of person they were exposed to (if applicable):  Patient's Country of origin[place]   O: Alert and oriented in NAD. P:  PPD placed on 02/11/2024.  Patient advised to return for reading within 48-72 hours.    Patient has work form that has been placed in PCP box for review.

## 2024-02-12 ENCOUNTER — Ambulatory Visit (INDEPENDENT_AMBULATORY_CARE_PROVIDER_SITE_OTHER)

## 2024-02-12 ENCOUNTER — Other Ambulatory Visit: Payer: Self-pay | Admitting: Nurse Practitioner

## 2024-02-12 DIAGNOSIS — Z111 Encounter for screening for respiratory tuberculosis: Secondary | ICD-10-CM | POA: Diagnosis not present

## 2024-02-13 ENCOUNTER — Ambulatory Visit (INDEPENDENT_AMBULATORY_CARE_PROVIDER_SITE_OTHER): Admitting: Physician Assistant

## 2024-02-13 ENCOUNTER — Other Ambulatory Visit (HOSPITAL_COMMUNITY): Payer: Self-pay

## 2024-02-13 ENCOUNTER — Encounter (INDEPENDENT_AMBULATORY_CARE_PROVIDER_SITE_OTHER): Payer: Self-pay | Admitting: Physician Assistant

## 2024-02-13 VITALS — BP 102/70 | HR 72 | Temp 98.3°F | Ht 66.0 in | Wt 230.0 lb

## 2024-02-13 DIAGNOSIS — E88819 Insulin resistance, unspecified: Secondary | ICD-10-CM

## 2024-02-13 DIAGNOSIS — Z9049 Acquired absence of other specified parts of digestive tract: Secondary | ICD-10-CM

## 2024-02-13 DIAGNOSIS — E669 Obesity, unspecified: Secondary | ICD-10-CM

## 2024-02-13 DIAGNOSIS — G4733 Obstructive sleep apnea (adult) (pediatric): Secondary | ICD-10-CM

## 2024-02-13 DIAGNOSIS — K5903 Drug induced constipation: Secondary | ICD-10-CM

## 2024-02-13 DIAGNOSIS — Z6837 Body mass index (BMI) 37.0-37.9, adult: Secondary | ICD-10-CM

## 2024-02-13 MED ORDER — DOCUSATE SODIUM 100 MG PO CAPS
100.0000 mg | ORAL_CAPSULE | Freq: Two times a day (BID) | ORAL | 0 refills | Status: DC
  Filled 2024-02-13: qty 60, 30d supply, fill #0

## 2024-02-13 MED ORDER — TIRZEPATIDE-WEIGHT MANAGEMENT 7.5 MG/0.5ML ~~LOC~~ SOAJ
7.5000 mg | SUBCUTANEOUS | 0 refills | Status: DC
  Filled 2024-02-13 – 2024-03-11 (×2): qty 2, 28d supply, fill #0

## 2024-02-13 NOTE — Progress Notes (Unsigned)
 SUBJECTIVE: Discussed the use of AI scribe software for clinical note transcription with the patient, who gave verbal consent to proceed.  Chief Complaint: Obesity  Interim History: She is up 4 lbs since last visit.  Down 29 lbs overall TBW loss of 11.2%  Brandi Ortiz is here to discuss her progress with her obesity treatment plan. She is on the Category 3 Plan and states she is following her eating plan approximately 50 % of the time. She states she is exercising 30 minutes 1 times per week.  Brandi Ortiz is a 53 year old female with obesity who presents for follow-up of her obesity treatment plan.  She has been on Zepbound  7.5 mg weekly and has experienced a total weight loss of 29 pounds. She started the 7.5 mg dose this past Friday and has not noticed a change in hunger, stating 'I'm still hungry.' She is currently following her plan at about 50% adherence and has only exercised twice since last month, with one session lasting 30 minutes.  She has a history of obstructive sleep apnea, insulin  resistance, and non-alcoholic fatty liver disease. She underwent a laparoscopic cholecystectomy on January 07, 2024, and has been doing well post-surgery. No issues with loose stools post-surgery, contrary to expectations, and describes her bowel movements as 'very hard.'  She is employed at [employer], where her hours have increased, and she is trying to adjust to a new routine. She mentions stress related to financial concerns and the need to improve her meal planning and preparation. She is focusing on increasing her protein intake but acknowledges a lack of fruits and vegetables in her diet. She has been making efforts to incorporate more fiber, such as a snack with Brandi Ortiz apple and cucumber, which she describes as 'only ninety five calories.'  She reports constipation issues, stating that she has tried Docolax but finds magnesium-based products too harsh, causing cramping. She has not  tried Colace. She mentions a recent incident at work where she was constipated and had to spend an extended time in the bathroom.  She has a stitch from her recent surgery that is protruding slightly, which she has discussed with her surgeon. She is monitoring it and considering having it checked if it does not resolve. OBJECTIVE: Visit Diagnoses: Problem List Items Addressed This Visit     Insulin  resistance   Relevant Medications   tirzepatide  (ZEPBOUND ) 7.5 MG/0.5ML Pen   OSA (obstructive sleep apnea) - Primary   Relevant Medications   tirzepatide  (ZEPBOUND ) 7.5 MG/0.5ML Pen   Drug-induced constipation   Relevant Medications   docusate sodium  (COLACE) 100 MG capsule   Other Visit Diagnoses       S/P laparoscopic cholecystectomy         Obesity (HCC)- Start BMI 41.8       Relevant Medications   tirzepatide  (ZEPBOUND ) 7.5 MG/0.5ML Pen     BMI 37.0-37.9, adult Current BMI 37.2          Obesity Brandi Ortiz has experienced a 29-pound weight loss on Zepbound  7.5 mg weekly, maintaining muscle mass but gaining 3.8 pounds of adipose tissue after holiday eating. She increased her dose from 5 mg to 7.5 mg without a change in hunger levels. She adheres to her diet plan at 50% and exercises minimally, with stress and lack of meal planning as challenges. She is considering a further dose increase if hunger control does not improve. - Continue Zepbound  7.5 mg weekly - Emphasize meal planning and preparation - Gradually increase  exercise - Consider increasing Zepbound  dose if hunger control does not improve  OSA on CPAP On Zepbound  for primary indication of OSA and continues to be symptomatic.   Intensive lifestyle modifications are the first line treatment for this issue. We discussed several lifestyle modifications today and she will continue to work on diet, exercise and weight loss efforts. We will continue to monitor. Orders and follow up as documented in patient record. Educated pt that OSA  is a cause of systemic hypertension and is associated with an increased incidence of stroke, heart failure, atrial fibrillation, and coronary heart disease.  Severe OSA increases all-cause mortality and cardiovascular mortality.  -Goal: Treatment of OSA via CPAP compliance and weight loss. Plasma ghrelin levels (appetite or "hunger hormone") are significantly higher in OSA patients than in BMI-matched controls, but decrease to levels similar to those of obese patients without OSA after CPAP treatment.  Weight loss improves OSA by several mechanisms, including reduction in fatty tissue in the throat (i.e. parapharyngeal fat) and the tongue. Loss of abdominal fat increases mediastinal traction on the upper airway making it less likely to collapse during sleep. Studies have also shown that compliance with CPAP treatment improves leptin imbalance.  Continue Zepbound  7.5 mg weekly and consider increasing at the next visit if hunger control not improved.  Meds ordered this encounter  Medications   tirzepatide  (ZEPBOUND ) 7.5 MG/0.5ML Pen    Sig: Inject 7.5 mg into the skin once a week.    Dispense:  2 mL    Refill:  0   docusate sodium  (COLACE) 100 MG capsule    Sig: Take 1 capsule (100 mg total) by mouth 2 (two) times daily.    Dispense:  60 capsule    Refill:  0    Insulin  resistance Insulin  resistance is under monitoring with plans to check insulin  levels during the next lab work. Metformin is considered to aid insulin  resistance and bowel movements, with no kidney issues present. - Check insulin  levels during next lab work - Consider starting metformin if insulin  resistance persists  Constipation Constipation persists/despite post-cholecystectomy. Docolax tablet was too harsh, and magnesium-based treatments cause cramping. Colace (docusate sodium ) is recommended to ease bowel movements without cramping. Linzess is an alternative if constipation persists and insurance allows. - Start Colace  (docusate sodium ) 1 tablet daily, increase to 2 if needed - Consider Linzess if constipation persists and insurance coverage allows   Cholecystectomy Post-laparoscopic cholecystectomy recovery is progressing well. A stitch protrudes from the incision site, which may need clipping if not dissolved. She should contact the surgeon if unresolved in 6-8 weeks. - Monitor incision site for stitch absorption - Contact surgeon if stitch does not dissolve in 6-8 weeks Vitals Temp: 98.3 F (36.8 C) BP: 102/70 Pulse Rate: 72 SpO2: 99 %   Anthropometric Measurements Height: 5\' 6"  (1.676 m) Weight: 230 lb (104.3 kg) BMI (Calculated): 37.14 Weight at Last Visit: 0 Weight Lost Since Last Visit: 0 Weight Gained Since Last Visit: 4 lb Starting Weight: 259 lb Total Weight Loss (lbs): 29 lb (13.2 kg) Peak Weight: 270   Body Composition  Body Fat %: 40.8 % Fat Mass (lbs): 94 lbs Muscle Mass (lbs): 129.4 lbs Total Body Water (lbs): 93 lbs Visceral Fat Rating : 11   Other Clinical Data Fasting: no Labs: no Today's Visit #: 20 Starting Date: 06/08/22     ASSESSMENT AND PLAN:  Diet: Brandi Ortiz is currently in the action stage of change. As such, her goal is to  continue with weight loss efforts. She has agreed to Category 3 Plan.  Exercise: Brandi Ortiz has been instructed to work up to a goal of 150 minutes of combined cardio and strengthening exercise per week for weight loss and overall health benefits.   Behavior Modification:  We discussed the following Behavioral Modification Strategies today: increasing lean protein intake, decreasing simple carbohydrates, increasing vegetables, increase H2O intake, increase high fiber foods, no skipping meals, meal planning and cooking strategies, avoiding temptations, and planning for success. We discussed various medication options to help Brandi Ortiz with her weight loss efforts and we both agreed to continue current regimen and continue to work on  nutritional and behavioral strategies to promote weight loss.  .  Return in about 4 weeks (around 03/12/2024).Brandi Ortiz She was informed of the importance of frequent follow up visits to maximize her success with intensive lifestyle modifications for her multiple health conditions.  Attestation Statements:   Reviewed by clinician on day of visit: allergies, medications, problem list, medical history, surgical history, family history, social history, and previous encounter notes.   Time spent on visit including pre-visit chart review and post-visit care and charting was 32 minutes.    Brandi Grantham, PA-C

## 2024-02-14 ENCOUNTER — Ambulatory Visit (INDEPENDENT_AMBULATORY_CARE_PROVIDER_SITE_OTHER)

## 2024-02-14 DIAGNOSIS — Z111 Encounter for screening for respiratory tuberculosis: Secondary | ICD-10-CM | POA: Diagnosis not present

## 2024-02-14 LAB — TB SKIN TEST
Induration: 0 mm
TB Skin Test: NEGATIVE

## 2024-02-14 NOTE — Progress Notes (Signed)
 PPD Reading Note PPD read and results entered in [employer]. Result: 0 mm induration. Interpretation: neg If test not read within 48-72 hours of initial placement, patient advised to repeat in other arm 1-3 weeks after this test. Allergic reaction: no  Patient had form that she needed filled out. She was given copy and one sent to scan in chart.

## 2024-02-16 ENCOUNTER — Other Ambulatory Visit (HOSPITAL_COMMUNITY): Payer: Self-pay

## 2024-02-21 ENCOUNTER — Other Ambulatory Visit (HOSPITAL_COMMUNITY): Payer: Self-pay

## 2024-03-04 ENCOUNTER — Encounter (INDEPENDENT_AMBULATORY_CARE_PROVIDER_SITE_OTHER): Payer: Self-pay | Admitting: Physician Assistant

## 2024-03-11 ENCOUNTER — Other Ambulatory Visit (HOSPITAL_COMMUNITY): Payer: Self-pay

## 2024-03-11 ENCOUNTER — Encounter (INDEPENDENT_AMBULATORY_CARE_PROVIDER_SITE_OTHER): Payer: Self-pay | Admitting: Physician Assistant

## 2024-03-11 ENCOUNTER — Ambulatory Visit (INDEPENDENT_AMBULATORY_CARE_PROVIDER_SITE_OTHER): Admitting: Physician Assistant

## 2024-03-11 VITALS — BP 108/71 | HR 68 | Temp 98.1°F | Ht 66.0 in | Wt 228.0 lb

## 2024-03-11 DIAGNOSIS — E88819 Insulin resistance, unspecified: Secondary | ICD-10-CM | POA: Diagnosis not present

## 2024-03-11 DIAGNOSIS — E559 Vitamin D deficiency, unspecified: Secondary | ICD-10-CM | POA: Diagnosis not present

## 2024-03-11 DIAGNOSIS — E669 Obesity, unspecified: Secondary | ICD-10-CM

## 2024-03-11 DIAGNOSIS — K760 Fatty (change of) liver, not elsewhere classified: Secondary | ICD-10-CM

## 2024-03-11 DIAGNOSIS — G4733 Obstructive sleep apnea (adult) (pediatric): Secondary | ICD-10-CM

## 2024-03-11 DIAGNOSIS — R5383 Other fatigue: Secondary | ICD-10-CM

## 2024-03-11 DIAGNOSIS — Z6836 Body mass index (BMI) 36.0-36.9, adult: Secondary | ICD-10-CM

## 2024-03-11 MED ORDER — TIRZEPATIDE-WEIGHT MANAGEMENT 10 MG/0.5ML ~~LOC~~ SOAJ
10.0000 mg | SUBCUTANEOUS | 0 refills | Status: DC
  Filled 2024-03-11: qty 2, 28d supply, fill #0

## 2024-03-11 NOTE — Progress Notes (Signed)
 SUBJECTIVE: Discussed the use of AI scribe software for clinical note transcription with the patient, who gave verbal consent to proceed.  Chief Complaint: Obesity  Interim History: She is down 2 lbs since last visit.  Muscle mass + 0.8 lbs Adipose mass -2.8 lbs  Down 31 lbs overall TBW loss of ~12%  Brandi Ortiz is here to discuss her progress with her obesity treatment plan. She is on the Category 3 Plan and states she is following her eating plan approximately 65 % of the time. She states she is exercising cardio/strength training 30 minutes 2 times per week.  Brandi Ortiz is a 53 year old female who presents for follow-up of her obesity treatment plan.  She has been participating in a thirty-day exercise challenge but managed to complete only two days of exercise last week due to personal commitments. Despite these challenges, she notes a slight weight loss and an increase in muscle mass, which she attributes to her exercise routine that includes cardio and weight training.  She has a history of insulin  resistance, obstructive sleep apnea, nonalcoholic fatty liver disease, and vitamin D  deficiency. She recently underwent a laparoscopic cholecystectomy and has done well following surgery. She reports sleeping well, having slept nine to twelve hours the previous night.  She is currently on Zepbound  7.5 mg for primary indication of sleep apnea and well as medical weight loss.  She feels hungry on this dose. She previously experienced constipation and has since managed her symptoms with over-the-counter remedies that are not magnesium-based.  She has been actively meal prepping, including making honey garlic chicken, balsamic rice, and sauted coleslaw, as well as preparing immunity shots and green smoothies. She is committed to her health and weight loss journey, despite the challenges she faces. Fasting labs were obtained today.  The patient was informed we would discuss the lab results  at the next visit unless there is a critical issue that needs to be addressed sooner. The patient agreed to keep the next visit at the agreed upon time to discuss these results.   OBJECTIVE: Visit Diagnoses: Problem List Items Addressed This Visit     Vitamin D  deficiency   Relevant Orders   VITAMIN D  25 Hydroxy (Vit-D Deficiency, Fractures)   NAFLD (nonalcoholic fatty liver disease)   Other fatigue   Relevant Orders   Vitamin B12   CBC with Differential/Platelet   Insulin  resistance   Relevant Medications   tirzepatide  (ZEPBOUND ) 10 MG/0.5ML Pen   Other Relevant Orders   CMP14+EGFR   Insulin , random   Hemoglobin A1c   Lipid Panel With LDL/HDL Ratio   OSA (obstructive sleep apnea) - Primary   Relevant Medications   tirzepatide  (ZEPBOUND ) 10 MG/0.5ML Pen   BMI 36.0-36.9,adult   Other Visit Diagnoses       Obesity (HCC)- Start BMI 41.8       Relevant Medications   tirzepatide  (ZEPBOUND ) 10 MG/0.5ML Pen     Obesity Obesity management is ongoing. She has been participating in a 30-day challenge and has exercised intermittently despite personal stressors. Muscle mass is almost a pound up, but adipose tissue is down by three pounds, indicating a positive change in body composition.   She is currently on Zepbound  7.5 mg for weight management but reports persistent hunger, indicating a need for dosage adjustment. Zepbound  is preferred over Wegovy  due to better tolerance and weight loss outcomes. - Increase Zepbound  to 10 mg for better appetite control. - Order fasting labs including insulin , A1c, and liver  function tests to monitor metabolic parameters and assess liver status.  Obstructive Sleep Apnea Obstructive sleep apnea is being managed without CPAP currently. She reports adequate sleep duration and using CPAP with good compliance. On Zepbound  for primary indication of OSA.  Intensive lifestyle modifications are the first line treatment for this issue. We discussed several  lifestyle modifications today and she will continue to work on diet, exercise and weight loss efforts. We will continue to monitor. Orders and follow up as documented in patient record. Educated pt that OSA is a cause of systemic hypertension and is associated with an increased incidence of stroke, heart failure, atrial fibrillation, and coronary heart disease.  Severe OSA increases all-cause mortality and cardiovascular mortality.  -Goal: Treatment of OSA via CPAP compliance and weight loss. Plasma ghrelin levels (appetite or "hunger hormone") are significantly higher in OSA patients than in BMI-matched controls, but decrease to levels similar to those of obese patients without OSA after CPAP treatment.  Weight loss improves OSA by several mechanisms, including reduction in fatty tissue in the throat (i.e. parapharyngeal fat) and the tongue. Loss of abdominal fat increases mediastinal traction on the upper airway making it less likely to collapse during sleep. Studies have also shown that compliance with CPAP treatment improves leptin imbalance. Plan to increase Zepbound  to 10 mg weekly and monitor result.   Insulin  Resistance Insulin  resistance is being monitored as part of the obesity management plan. She is on Zepbound , which may aid in improving insulin  sensitivity.  Lab Results  Component Value Date   HGBA1C 4.6 (L) 09/04/2023   HGBA1C 4.5 (L) 03/05/2023   HGBA1C 5.1 06/08/2022   Lab Results  Component Value Date   LDLCALC 79 09/04/2023   CREATININE 0.77 12/11/2023   INSULIN   Date Value Ref Range Status  09/04/2023 17.4 2.6 - 24.9 uIU/mL Final  ]Continue working on nutrition plan to decrease simple carbohydrates, increase lean proteins and exercise to promote weight loss, improve glycemic control and prevent progression to Type 2 diabetes.  Fasting labs will provide further insight into current insulin  levels and A1c.   Nonalcoholic Fatty Liver Disease Nonalcoholic fatty liver  disease is being monitored. Previous labs indicated elevated liver enzymes, possibly related to gallbladder issues, which have since been addressed with cholecystectomy. She is not currently constipated, which may positively impact liver function tests. Lab Results  Component Value Date   ALT 62 (H) 12/11/2023   AST 36 12/11/2023   ALKPHOS 139 (H) 12/11/2023   BILITOT 0.7 12/11/2023   Plan: recheck Liver function on CMET   Intensive lifestyle modifications are the first line treatment for this issue.  We discussed several lifestyle modifications today and she will continue to work on diet, exercise and weight loss efforts.    Vitamin D  Deficiency Vitamin D  is not at goal of 50.  Most recent vitamin D  level was 35.1. She is on OTC vitamin D3 2000 IU daily. No N/V or muscle weakness with vitamin D .  Lab Results  Component Value Date   VD25OH 35.1 09/04/2023   VD25OH 30.9 03/05/2023   VD25OH 27.8 (L) 06/08/2022    Plan: Continue OTC vitamin D3 2000 IU daily Low vitamin D  levels can be associated with adiposity and may result in leptin resistance and weight gain. Also associated with fatigue.  Currently on vitamin D  supplementation without any adverse effects such as nausea, vomiting or muscle weakness.  Recheck vitamin D  level today.   Other fatigue Recheck B 12 and CBC today.  Vitals Temp: 98.1 F (36.7 C) BP: 108/71 Pulse Rate: 68 SpO2: 98 %   Anthropometric Measurements Height: 5\' 6"  (1.676 m) Weight: 228 lb (103.4 kg) BMI (Calculated): 36.82 Weight at Last Visit: 230 lb Weight Lost Since Last Visit: 2 lb Weight Gained Since Last Visit: 0 Total Weight Loss (lbs): 31 lb (14.1 kg) Peak Weight: 270 lb   Body Composition  Body Fat %: 39.9 % Fat Mass (lbs): 91.2 lbs Muscle Mass (lbs): 130.2 lbs Total Body Water (lbs): 87.2 lbs Visceral Fat Rating : 11   Other Clinical Data Fasting: Yes Labs: Yes Today's Visit #: 21 Starting Date: 06/08/22     ASSESSMENT  AND PLAN:  Diet: Kerrin is currently in the action stage of change. As such, her goal is to continue with weight loss efforts. She has agreed to Category 3 Plan.  Exercise: Malloree has been instructed to work up to a goal of 150 minutes of combined cardio and strengthening exercise per week for weight loss and overall health benefits.   Behavior Modification:  We discussed the following Behavioral Modification Strategies today: increasing lean protein intake, decreasing simple carbohydrates, increasing vegetables, increase H2O intake, increase high fiber foods, no skipping meals, meal planning and cooking strategies, avoiding temptations, and planning for success. We discussed various medication options to help Zahria with her weight loss efforts and we both agreed to increase Zepbound  to 10 mg weekly for primary indication of OSA and medical weight loss and continue to work on nutritional and behavioral strategies to promote weight loss.  .  Return in about 4 weeks (around 04/08/2024).Aaron Aas She was informed of the importance of frequent follow up visits to maximize her success with intensive lifestyle modifications for her multiple health conditions.  Attestation Statements:   Reviewed by clinician on day of visit: allergies, medications, problem list, medical history, surgical history, family history, social history, and previous encounter notes.   Time spent on visit including pre-visit chart review and post-visit care and charting was 35 minutes.    Wealthy Danielski, PA-C

## 2024-03-12 LAB — CMP14+EGFR
ALT: 26 IU/L (ref 0–32)
AST: 20 IU/L (ref 0–40)
Albumin: 4.2 g/dL (ref 3.8–4.9)
Alkaline Phosphatase: 153 IU/L — ABNORMAL HIGH (ref 44–121)
BUN/Creatinine Ratio: 15 (ref 9–23)
BUN: 14 mg/dL (ref 6–24)
Bilirubin Total: 0.7 mg/dL (ref 0.0–1.2)
CO2: 20 mmol/L (ref 20–29)
Calcium: 8.8 mg/dL (ref 8.7–10.2)
Chloride: 103 mmol/L (ref 96–106)
Creatinine, Ser: 0.94 mg/dL (ref 0.57–1.00)
Globulin, Total: 2.3 g/dL (ref 1.5–4.5)
Glucose: 78 mg/dL (ref 70–99)
Potassium: 4.2 mmol/L (ref 3.5–5.2)
Sodium: 141 mmol/L (ref 134–144)
Total Protein: 6.5 g/dL (ref 6.0–8.5)
eGFR: 73 mL/min/{1.73_m2} (ref >59)

## 2024-03-12 LAB — CBC WITH DIFFERENTIAL/PLATELET
Basophils Absolute: 0 10*3/uL (ref 0.0–0.2)
Basos: 0 %
EOS (ABSOLUTE): 0.1 10*3/uL (ref 0.0–0.4)
Eos: 3 %
Hematocrit: 45.7 % (ref 34.0–46.6)
Hemoglobin: 14 g/dL (ref 11.1–15.9)
Immature Grans (Abs): 0 10*3/uL (ref 0.0–0.1)
Immature Granulocytes: 0 %
Lymphocytes Absolute: 1.9 10*3/uL (ref 0.7–3.1)
Lymphs: 36 %
MCH: 26.8 pg (ref 26.6–33.0)
MCHC: 30.6 g/dL — ABNORMAL LOW (ref 31.5–35.7)
MCV: 87 fL (ref 79–97)
Monocytes Absolute: 0.2 10*3/uL (ref 0.1–0.9)
Monocytes: 4 %
Neutrophils Absolute: 3 10*3/uL (ref 1.4–7.0)
Neutrophils: 57 %
Platelets: 227 10*3/uL (ref 150–450)
RBC: 5.23 x10E6/uL (ref 3.77–5.28)
RDW: 14.6 % (ref 11.7–15.4)
WBC: 5.2 10*3/uL (ref 3.4–10.8)

## 2024-03-12 LAB — HEMOGLOBIN A1C
Est. average glucose Bld gHb Est-mCnc: 80 mg/dL
Hgb A1c MFr Bld: 4.4 % — ABNORMAL LOW (ref 4.8–5.6)

## 2024-03-12 LAB — LIPID PANEL WITH LDL/HDL RATIO
Cholesterol, Total: 159 mg/dL (ref 100–199)
HDL: 58 mg/dL (ref >39)
LDL Chol Calc (NIH): 83 mg/dL (ref 0–99)
LDL/HDL Ratio: 1.4 ratio (ref 0.0–3.2)
Triglycerides: 96 mg/dL (ref 0–149)
VLDL Cholesterol Cal: 18 mg/dL (ref 5–40)

## 2024-03-12 LAB — VITAMIN B12: Vitamin B-12: 792 pg/mL (ref 232–1245)

## 2024-03-12 LAB — INSULIN, RANDOM: INSULIN: 22.8 u[IU]/mL (ref 2.6–24.9)

## 2024-03-12 LAB — VITAMIN D 25 HYDROXY (VIT D DEFICIENCY, FRACTURES): Vit D, 25-Hydroxy: 30.2 ng/mL (ref 30.0–100.0)

## 2024-04-07 NOTE — Progress Notes (Signed)
 SUBJECTIVE: Discussed the use of AI scribe software for clinical note transcription with the patient, who gave verbal consent to proceed.  Chief Complaint: Obesity  Interim History: She is up 2 lbs since last visit.  Down 29 lbs overall TBW loss of 11.2%  Brandi Ortiz is here to discuss her progress with her obesity treatment plan. She is on the Category 3 Plan and states she is following her eating plan approximately 65 % of the time. She states she is exercising 30 minutes 1 times per week. Brandi Ortiz is a 53 year old female with obesity who presents for follow-up of her obesity treatment plan.  She experiences increased hunger despite a recent increase in her medication dosage. She wakes up hungry and experiences hunger to the point of headache by mid-morning. Her current medication, Zepbound , does not seem to be effective in controlling her appetite.  She has a history of obstructive sleep apnea, insulin  resistance, nonalcoholic fatty liver disease, and vitamin D  deficiency. She underwent a laparoscopic cholecystectomy earlier this year due to gallstones. Her GLP-1 medication was held around the time of her gallbladder surgery and has since been resumed.  She experiences drug-induced constipation and manages it with dietary adjustments, including smoothies, green tea with cloves and ginger, and increased water intake. She reports occasional constipation but finds that her regimen generally helps maintain regular bowel movements.  She is currently taking 2,000 IU of vitamin D  daily. She does not take vitamin B supplements but her levels are adequate. She monitors her water intake and uses strategies to remind herself to drink more fluids, such as carrying a water bottle and using sticky notes as reminders.  Recent lab results show normal cholesterol levels, with HDL at 58, triglycerides at 96, and LDL at 83. Her vitamin D  levels are not at goal, and her MCHC is slightly low. Her A1c is  low, and she occasionally feels weak if she waits too long to eat, but she does not experience frequent hypoglycemic symptoms. No frequent hypoglycemic symptoms.  OBJECTIVE: Visit Diagnoses: Problem List Items Addressed This Visit     Vitamin D  deficiency   NAFLD (nonalcoholic fatty liver disease)   Insulin  resistance   OSA (obstructive sleep apnea) - Primary   Relevant Medications   tirzepatide  (ZEPBOUND ) 12.5 MG/0.5ML Pen   Other Visit Diagnoses       Obesity (HCC)- Start BMI 41.8       Relevant Medications   tirzepatide  (ZEPBOUND ) 12.5 MG/0.5ML Pen     Obesity She experiences increased hunger despite recent adjustments to her GLP-1 medication, Zepbound , leading to headaches and reduced exercise. The current dose is insufficient for appetite suppression. Insurance covers the medication for sleep apnea, and she uses a coupon to reduce costs. - Increase Zepbound  dosage to improve appetite control. - Encourage protein-based snacks to manage hunger and prevent hypoglycemia. - Monitor weight and appetite response to medication adjustment.  Insulin  Resistance Her insulin  level is elevated at 22.8, indicating insulin  resistance, although her A1c is low, suggesting effective glucose control. The transition from Wegovy  to Zepbound  may have influenced insulin  levels. The goal is to improve insulin  sensitivity and reduce insulin  levels through weight loss and medication adjustment. - Continue monitoring insulin  levels and adjust treatment as needed. - Focus on weight loss to improve insulin  sensitivity.  Nonalcoholic Fatty Liver Disease (NAFLD) She has NAFLD with intermittently elevated alkaline phosphatase levels, indicating ongoing liver function concerns. - Monitor liver function tests periodically. - Emphasize weight loss to improve  liver health.  Obstructive Sleep Apnea Her obstructive sleep apnea is managed with her current treatment plan. She continues to be symptomatic due to  OSA. Plan: Continue and increase Zepbound  to optimize management.  Meds ordered this encounter  Medications   tirzepatide  (ZEPBOUND ) 12.5 MG/0.5ML Pen    Sig: Inject 12.5 mg into the skin once a week.    Dispense:  2 mL    Refill:  1     Drug-Induced Constipation She manages drug-induced constipation with dietary adjustments, including smoothies and green tea, and reports occasional constipation. - Continue current dietary regimen to manage constipation. - Monitor bowel habits and adjust as needed.  Vitamin D  Deficiency Her vitamin D  level is below the target range of 50-70. She is currently taking 2000 IU of vitamin D  daily. - Increase vitamin D  intake to 3000 IU daily.  General Health Maintenance She is improving hydration habits, her cholesterol levels are within target ranges, and her vitamin B levels are adequate. Her MCHC is slightly low, possibly due to post-surgical anemia, but other hemoglobin levels are normal. - Encourage continued hydration strategies. - Monitor cholesterol and vitamin levels periodically. - Recheck MCHC levels to monitor for improvement.  Follow-up She has scheduled follow-up appointments to monitor her progress and adjust treatment as needed.  - Schedule follow-up appointments for August and September. - Ensure medication refills are available to avoid gaps in treatment.  Vitals Temp: 98.5 F (36.9 C) BP: 106/71 Pulse Rate: 72 SpO2: 99 %   Anthropometric Measurements Height: 5' 6 (1.676 m) Weight: 230 lb (104.3 kg) BMI (Calculated): 37.14 Weight at Last Visit: 228 lb Weight Lost Since Last Visit: 0 Weight Gained Since Last Visit: 2 lb Total Weight Loss (lbs): 29 lb (13.2 kg) Peak Weight: 270 lb   Body Composition  Body Fat %: 40.9 % Fat Mass (lbs): 94.4 lbs Muscle Mass (lbs): 129.4 lbs Total Body Water (lbs): 89.6 lbs Visceral Fat Rating : 11   Other Clinical Data Fasting: No Labs: No Today's Visit #: 22 Starting Date:  06/08/22     ASSESSMENT AND PLAN:  Diet: Brandi Ortiz is currently in the action stage of change. As such, her goal is to continue with weight loss efforts. She has agreed to Category 3 Plan.  Exercise: Brandi Ortiz has been instructed to work up to a goal of 150 minutes of combined cardio and strengthening exercise per week for weight loss and overall health benefits.   Behavior Modification:  We discussed the following Behavioral Modification Strategies today: increasing lean protein intake, decreasing simple carbohydrates, increasing vegetables, increase H2O intake, increase high fiber foods, no skipping meals, meal planning and cooking strategies, avoiding temptations, and planning for success. We discussed various medication options to help Brandi Ortiz with her weight loss efforts and we both agreed to increase Zepbound  to 12.5 mg weekly, continue to work on nutritional and behavioral strategies to promote weight loss.  .  Return in about 4 weeks (around 05/06/2024).SABRA She was informed of the importance of frequent follow up visits to maximize her success with intensive lifestyle modifications for her multiple health conditions.  Attestation Statements:   Reviewed by clinician on day of visit: allergies, medications, problem list, medical history, surgical history, family history, social history, and previous encounter notes.   Time spent on visit including pre-visit chart review and post-visit care and charting was 28 minutes.    Dawsyn Ramsaran, PA-C

## 2024-04-08 ENCOUNTER — Encounter (INDEPENDENT_AMBULATORY_CARE_PROVIDER_SITE_OTHER): Payer: Self-pay | Admitting: *Deleted

## 2024-04-08 ENCOUNTER — Telehealth (INDEPENDENT_AMBULATORY_CARE_PROVIDER_SITE_OTHER): Payer: Self-pay | Admitting: *Deleted

## 2024-04-08 ENCOUNTER — Other Ambulatory Visit (HOSPITAL_COMMUNITY): Payer: Self-pay

## 2024-04-08 ENCOUNTER — Ambulatory Visit (INDEPENDENT_AMBULATORY_CARE_PROVIDER_SITE_OTHER): Admitting: Physician Assistant

## 2024-04-08 ENCOUNTER — Encounter (INDEPENDENT_AMBULATORY_CARE_PROVIDER_SITE_OTHER): Payer: Self-pay | Admitting: Physician Assistant

## 2024-04-08 VITALS — BP 106/71 | HR 72 | Temp 98.5°F | Ht 66.0 in | Wt 230.0 lb

## 2024-04-08 DIAGNOSIS — E88819 Insulin resistance, unspecified: Secondary | ICD-10-CM

## 2024-04-08 DIAGNOSIS — G4733 Obstructive sleep apnea (adult) (pediatric): Secondary | ICD-10-CM

## 2024-04-08 DIAGNOSIS — E669 Obesity, unspecified: Secondary | ICD-10-CM

## 2024-04-08 DIAGNOSIS — E559 Vitamin D deficiency, unspecified: Secondary | ICD-10-CM

## 2024-04-08 DIAGNOSIS — K760 Fatty (change of) liver, not elsewhere classified: Secondary | ICD-10-CM | POA: Diagnosis not present

## 2024-04-08 DIAGNOSIS — Z6837 Body mass index (BMI) 37.0-37.9, adult: Secondary | ICD-10-CM

## 2024-04-08 MED ORDER — ZEPBOUND 12.5 MG/0.5ML ~~LOC~~ SOAJ
12.5000 mg | SUBCUTANEOUS | 1 refills | Status: DC
  Filled 2024-04-08: qty 2, 28d supply, fill #0

## 2024-04-08 NOTE — Telephone Encounter (Signed)
 PA submitted via coverMymeds. Key: [id number]  Information regarding your request Member required to enroll in virtual care program before PA can be submitted.  Patient was notified of this via Mychart.

## 2024-04-09 ENCOUNTER — Other Ambulatory Visit (HOSPITAL_COMMUNITY): Payer: Self-pay

## 2024-04-09 ENCOUNTER — Other Ambulatory Visit: Payer: Self-pay

## 2024-04-18 ENCOUNTER — Encounter: Payer: Self-pay | Admitting: Nurse Practitioner

## 2024-05-05 NOTE — Progress Notes (Unsigned)
 SUBJECTIVE: Discussed the use of AI scribe software for clinical note transcription with the patient, who gave verbal consent to proceed.  Chief Complaint: Obesity  Interim History: Brandi Ortiz is down 2 pounds since last visit Down 31 pounds overall TBW loss of ~ 12% Brandi Ortiz is here to discuss Brandi Ortiz progress with Brandi Ortiz obesity treatment plan. Brandi Ortiz is on the Category 3 Plan and states Brandi Ortiz is following Brandi Ortiz eating plan approximately 70 % of the time. Brandi Ortiz states Brandi Ortiz is exercising 30 minutes 2 times per week. Brandi Ortiz is a 53 year old female who presents for follow-up of Brandi Ortiz obesity treatment plan.  Brandi Ortiz has been experiencing constipation, which Brandi Ortiz attributes to Brandi Ortiz medication regimen. Brandi Ortiz had a severe episode of constipation resulting in hard stools and bright red blood in Brandi Ortiz stool, which resolved by the third day. No further issues have occurred since then. Brandi Ortiz last colonoscopy in 2020 was clear with no polyps or diverticular disease, no other concerning issues and the gastroenterologist recommended no repeat colonoscopy for 10 years.  Brandi Ortiz has a history of obstructive sleep apnea, insulin  resistance, nonalcoholic fatty liver disease, drug-induced constipation, and vitamin D  deficiency. Brandi Ortiz is currently taking Brandi Ortiz  12.5 mg once weekly for primary indication of obstructive sleep apnea, over-the-counter vitamin D  2000 units daily, and a daily multivitamin. Brandi Ortiz experiences constipation, which Brandi Ortiz manages by drinking detox tea containing senna and increasing Brandi Ortiz fiber intake through fresh fruits.  Brandi Ortiz actively manages Brandi Ortiz weight through diet and exercise, running and walking a mile at a track, and incorporating weight training exercises such as arm lifts and lunges. Brandi Ortiz has lost 31 pounds and we discussed focusing on building muscle mass/weight training as well.  Brandi Ortiz practices meditation to aid in Brandi Ortiz weight management and overall well-being.  Brandi Ortiz is mindful of Brandi Ortiz diet, consuming fresh fruits and  avoiding excessive intake of unhealthy foods. 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 Brandi Ortiz is not prone to overeating and often discards excess food.  Brandi Ortiz is not skipping meals.  Brandi Ortiz feels Brandi Ortiz has consistently met protein needs and is working on ensuring adequate fluid/water intake daily  Brandi Ortiz has been postmenopausal since 2017 and acknowledges the challenges of weight management during menopause.  OBJECTIVE: Visit Diagnoses: Problem List Items Addressed This Visit     Vitamin D  deficiency   Insulin  resistance   OSA (obstructive sleep apnea) - Primary   Relevant Medications   tirzepatide  (Brandi Ortiz ) 12.5 MG/0.5ML Pen   Drug-induced constipation   BMI 36.0-36.9,adult   Other Visit Diagnoses       BRBPR (bright red blood per rectum)         Obesity (HCC)- Start BMI 41.8       Relevant Medications   tirzepatide  (Brandi Ortiz ) 12.5 MG/0.5ML Pen     Obesity Brandi Ortiz has lost 31 pounds and increased muscle mass, indicating a positive outcome. Currently on Brandi Ortiz  12.5 mg once weekly, which is effective for weight management. Brandi Ortiz experiences constipation as a side effect, managed with increased water intake, fiber, and detox tea containing senna.  Denies mass in neck, dysphagia, dyspepsia, persistent hoarseness, abdominal pain, or N/V or diarrhea. Has annual eye exam. Mood is stable.   Engages in regular physical activity, including running and weight training. Aware of insurance limitations on medication and considering adjusting dosing schedule to extend supply. - Continue Brandi Ortiz  12.5 mg once weekly. - Increase water intake and dietary fiber to manage constipation. - Consider adjusting Brandi Ortiz   dosing to every 10 days or every other week to extend medication supply. - Engage in regular physical activity, including running and weight training. - Monitor weight and muscle mass regularly.  Obstructive Sleep  Apnea Obstructive sleep apnea is managed with Brandi Ortiz , which is effective in Brandi Ortiz treatment plan.  Brandi Ortiz has experienced a 12% weight loss but remains symptomatic and under treatment for sleep apnea - Continue Brandi Ortiz  12.5 mg once weekly for primary indication obstructive sleep apnea. Meds ordered this encounter  Medications   tirzepatide  (Brandi Ortiz ) 12.5 MG/0.5ML Pen    Sig: Inject 12.5 mg into the skin once a week.    Dispense:  2 mL    Refill:  1   Insulin  Resistance Last fasting insulin  was 22.8-not at goal and increased. A1c was 4.4 at goal. Polyphagia:No Medication(s): Brandi Ortiz  12.5 mg SQ weekly for primary indication of obstructive sleep apnea see above Lab Results  Component Value Date   HGBA1C 4.4 (L) 03/11/2024   HGBA1C 4.6 (L) 09/04/2023   HGBA1C 4.5 (L) 03/05/2023   HGBA1C 5.1 06/08/2022   HGBA1C 4.8 07/19/2021   Lab Results  Component Value Date   INSULIN  22.8 03/11/2024   INSULIN  17.4 09/04/2023   INSULIN  41.6 (H) 03/05/2023   INSULIN  39.3 (H) 06/08/2022    Plan: Continue and refill Brandi Ortiz  12.5 mg SQ weekly Continue working on nutrition plan to decrease simple carbohydrates, increase lean proteins and exercise to promote weight loss, improve glycemic control and prevent progression to Type 2 diabetes.   Constipation/ Episode of BRBPR Experiences constipation exacerbated by Brandi Ortiz , leading to hard stools and rectal bleeding. Managed with increased water intake, fiber, and detox tea containing senna.  Denies any abdominal pain or other associated symptoms.  Had colonoscopy in 2020 without polyps evidence of any diverticular disease or other concerning findings - Increase water intake and dietary fiber. - Use detox tea containing senna as needed to manage constipation.  Vitamin D  Deficiency Currently taking over-the-counter vitamin D  2000 units daily as part of ongoing management.  No nausea/vomiting muscle weakness or other side effects with over-the-counter  vitamin D  Last vitamin D  Lab Results  Component Value Date   VD25OH 30.2 03/11/2024  Low vitamin D  levels can be associated with adiposity and may result in leptin resistance and weight gain. Also associated with fatigue.  Currently on vitamin D  supplementation without any adverse effects such as nausea, vomiting or muscle weakness.  - Continue over-the-counter vitamin D  2000 units daily.  General Health Maintenance Actively engaged in lifestyle modifications, including a diet rich in fruits, regular physical activity, and meditation for mental well-being. - Continue regular physical activity, including running and weight training. - Maintain a diet rich in fruits and fiber. - Continue meditation practices for mental well-being.  Follow-up Aware of insurance limitations on medication and considering adjusting dosing schedule. Plans to follow up on insurance policy and explore alternative weight loss medications if necessary. - Follow up with insurance to clarify medication coverage and time limits. - Consider alternative weight loss medications if Brandi Ortiz  is no longer covered. - Schedule regular follow-up appointments to monitor progress.  Vitals Temp: 98.1 F (36.7 C) BP: 108/73 Pulse Rate: 69 SpO2: 100 %   Anthropometric Measurements Height: 5' 6 (1.676 m) Weight: 228 lb (103.4 kg) BMI (Calculated): 36.82 Weight at Last Visit: 230 lb Weight Lost Since Last Visit: 2 lb Weight Gained Since Last Visit: 0 Starting Weight: 259 lb Total Weight Loss (lbs): 31 lb (14.1 kg)   Body Composition  Body Fat %: 39.9 % Fat Mass (lbs): 91 lbs Muscle Mass (lbs): 130.2 lbs Total Body Water (lbs): 88.8 lbs Visceral Fat Rating : 11   Other Clinical Data Fasting: No Labs: No Today's Visit #: 23 Starting Date: 06/08/22     ASSESSMENT AND PLAN:  Diet: Brandi Ortiz is currently in the action stage of change. As such, Brandi Ortiz goal is to continue with weight loss efforts. Brandi Ortiz has agreed  to Category 3 Plan.  Exercise: Brandi Ortiz has been instructed to work up to a goal of 150 minutes of combined cardio and strengthening exercise per week for weight loss and overall health benefits.   Behavior Modification:  We discussed the following Behavioral Modification Strategies today: increasing lean protein intake, decreasing simple carbohydrates, increasing vegetables, increase H2O intake, increase high fiber foods, no skipping meals, meal planning and cooking strategies, avoiding temptations, and planning for success. We discussed various medication options to help Brandi Ortiz with Brandi Ortiz weight loss efforts and we both agreed to continue current treatment plan  Return in about 4 weeks (around 06/03/2024).SABRA Brandi Ortiz was informed of the importance of frequent follow up visits to maximize Brandi Ortiz success with intensive lifestyle modifications for Brandi Ortiz multiple health conditions.  Attestation Statements:   Reviewed by clinician on day of visit: allergies, medications, problem list, medical history, surgical history, family history, social history, and previous encounter notes.   Time spent on visit including pre-visit chart review and post-visit care and charting was 40 minutes.    Brandi Lore, PA-C

## 2024-05-06 ENCOUNTER — Other Ambulatory Visit (HOSPITAL_COMMUNITY): Payer: Self-pay

## 2024-05-06 ENCOUNTER — Encounter (INDEPENDENT_AMBULATORY_CARE_PROVIDER_SITE_OTHER): Payer: Self-pay | Admitting: Physician Assistant

## 2024-05-06 ENCOUNTER — Other Ambulatory Visit: Payer: Self-pay

## 2024-05-06 ENCOUNTER — Ambulatory Visit (INDEPENDENT_AMBULATORY_CARE_PROVIDER_SITE_OTHER): Admitting: Physician Assistant

## 2024-05-06 VITALS — BP 108/73 | HR 69 | Temp 98.1°F | Ht 66.0 in | Wt 228.0 lb

## 2024-05-06 DIAGNOSIS — E669 Obesity, unspecified: Secondary | ICD-10-CM

## 2024-05-06 DIAGNOSIS — G4733 Obstructive sleep apnea (adult) (pediatric): Secondary | ICD-10-CM

## 2024-05-06 DIAGNOSIS — K625 Hemorrhage of anus and rectum: Secondary | ICD-10-CM | POA: Diagnosis not present

## 2024-05-06 DIAGNOSIS — K760 Fatty (change of) liver, not elsewhere classified: Secondary | ICD-10-CM

## 2024-05-06 DIAGNOSIS — K5903 Drug induced constipation: Secondary | ICD-10-CM

## 2024-05-06 DIAGNOSIS — E88819 Insulin resistance, unspecified: Secondary | ICD-10-CM

## 2024-05-06 DIAGNOSIS — Z6836 Body mass index (BMI) 36.0-36.9, adult: Secondary | ICD-10-CM

## 2024-05-06 DIAGNOSIS — E559 Vitamin D deficiency, unspecified: Secondary | ICD-10-CM

## 2024-05-06 MED ORDER — ZEPBOUND 12.5 MG/0.5ML ~~LOC~~ SOAJ
12.5000 mg | SUBCUTANEOUS | 1 refills | Status: DC
Start: 2024-05-06 — End: 2024-06-04
  Filled 2024-05-06: qty 2, 28d supply, fill #0

## 2024-05-24 ENCOUNTER — Other Ambulatory Visit: Payer: Self-pay | Admitting: Nurse Practitioner

## 2024-06-03 NOTE — Progress Notes (Unsigned)
 SUBJECTIVE: Discussed the use of AI scribe software for clinical note transcription with the patient, who gave verbal consent to proceed.  Chief Complaint: Obesity  Interim History: She is down 2 lbs since her last visit Down 33 lbs overall TBW loss of 12.7%  Body adipose down to 38.6%   Brandi Ortiz is here to discuss her progress with her obesity treatment plan. She is on the Category 3 Plan and states she is following her eating plan approximately 75 % of the time. She states she is exercising 30 minutes 2 times per week.  Brandi Ortiz is a 53 year old female who presents for follow-up of her obesity treatment plan.  She has been taking Zioptan 12.5 mg every other week instead of weekly due to insurance issues. Despite this, she feels good overall. She has increased her physical activity to almost daily, which has led to a gain of two pounds of muscle and a loss of 3.6 pounds of adipose tissue.  She has a history of obstructive sleep apnea and uses CPAP therapy. She experiences trouble sleeping, which she attributes to her irregular work schedule, as her work hours vary significantly, making it difficult to establish a consistent routine.  She has insulin resistance and non-alcoholic fatty liver disease. She maintains a diet of fresh foods, including watermelon, cucumber, lettuce, lemon juice, garlic, and basil, and prepares her meals in advance to avoid unhealthy eating at work. She is mindful of her budget and uses a strategic approach to grocery shopping to maximize savings and fuel points.  She experiences drug-induced constipation and manages it by drinking tea daily and staying active. She ensures she does not go more than a day without a bowel movement, using tea and exercise to maintain regularity. She has not had an episode of constipation this month.  Her A1c is 4.4, indicating she tends to run on the lower side. She ensures to have snacks available to prevent hypoglycemia,  cannot skip meals, and always carries a snack to work to avoid feeling unwell if she cannot take a break.  OBJECTIVE: Visit Diagnoses: Problem List Items Addressed This Visit     Vitamin D deficiency   NAFLD (nonalcoholic fatty liver disease)   Insulin resistance   OSA (obstructive sleep apnea) - Primary   Relevant Medications   tirzepatide (ZEPBOUND) 12.5 MG/0.5ML Pen   Drug-induced constipation   BMI 36.0-36.9,adult   Other Visit Diagnoses       Obesity (HCC)- Start BMI 41.8       Relevant Medications   tirzepatide (ZEPBOUND) 12.5 MG/0.5ML Pen     Obesity Obesity management is ongoing with a focus on weight loss and muscle gain. She has lost 3.6 pounds of adipose tissue and gained 2 pounds of muscle. She is managing her weight effectively despite a variable work schedule. - Continue current exercise regimen with increased frequency. - Maintain dietary habits focusing on fresh foods and portion control. - Refill Zepbound at [facility].  Insulin resistance Her A1c is 4.4, indicating good glycemic control. - Continue dietary management with focus on regular meals and snacks.  Nonalcoholic fatty liver disease Nonalcoholic fatty liver disease is being managed.  Obstructive sleep apnea Obstructive sleep apnea is being managed with CPAP therapy. She is on Zepbound 12.5 mg once weekly, primarily for this condition. - Continue CPAP therapy. - Refill Zepbound at [facility]  Difficulty initiating sleep Difficulty initiating sleep is likely related to an irregular work schedule. She is exploring strategies to improve  sleep hygiene, such as adjusting meal times and considering light snacks before bed. - Consider small protein-rich snacks before bed to avoid reflux and aid sleep.  Drug-induced constipation Constipation is being managed with daily tea consumption and increased physical activity. She is attentive to bowel habits and takes action if there is no bowel movement  for a day. - Continue daily tea consumption to manage constipation. - Maintain regular physical activity to aid bowel movements.  Vitals Temp: 97.9 F (36.6 C) BP: 111/70 Pulse Rate: 71 SpO2: 98 %   Anthropometric Measurements Height: 5' 6 (1.676 m) Weight: 226 lb (102.5 kg) BMI (Calculated): 36.49 Weight at Last Visit: 228 lb Weight Lost Since Last Visit: 2 lb Weight Gained Since Last Visit: 0 Starting Weight: 259 lb Total Weight Loss (lbs): 33 lb (15 kg) Peak Weight: 270 lb   Body Composition  Body Fat %: 38.6 % Fat Mass (lbs): 87.4 lbs Muscle Mass (lbs): 132.2 lbs Total Body Water (lbs): 88.6 lbs Visceral Fat Rating : 11   Other Clinical Data Fasting: no Labs: no Today's Visit #: 24 Starting Date: 06/08/22     ASSESSMENT AND PLAN:  Diet: Brandi Ortiz is currently in the action stage of change. As such, her goal is to continue with weight loss efforts. She has agreed to Category 3 Plan and keeping a food journal and adhering to recommended goals of 1500-1600 calories and 100 grams of protein.  Exercise: Brandi Ortiz has been instructed to work up to a goal of 150 minutes of combined cardio and strengthening exercise per week and to continue exercising as is for weight loss and overall health benefits.   Behavior Modification:  We discussed the following Behavioral Modification Strategies today: increasing lean protein intake, decreasing simple carbohydrates, increasing vegetables, increase H2O intake, increase high fiber foods, meal planning and cooking strategies, avoiding temptations, planning for success, and keep a strict food journal. We discussed various medication options to help Brandi Ortiz with her weight loss efforts and we both agreed to continue current treatment plan.  Return in about 4 weeks (around 07/02/2024).Brandi Ortiz She was informed of the importance of frequent follow up visits to maximize her success with intensive lifestyle modifications for her multiple  health conditions.  Attestation Statements:   Reviewed by clinician on day of visit: allergies, medications, problem list, medical history, surgical history, family history, social history, and previous encounter notes.   Time spent on visit including pre-visit chart review and post-visit care and charting was *** minutes.    Brandi Connery, PA-C

## 2024-06-04 ENCOUNTER — Other Ambulatory Visit (HOSPITAL_COMMUNITY): Payer: Self-pay

## 2024-06-04 ENCOUNTER — Ambulatory Visit (INDEPENDENT_AMBULATORY_CARE_PROVIDER_SITE_OTHER): Admitting: Physician Assistant

## 2024-06-04 ENCOUNTER — Encounter (INDEPENDENT_AMBULATORY_CARE_PROVIDER_SITE_OTHER): Payer: Self-pay | Admitting: Physician Assistant

## 2024-06-04 VITALS — BP 111/70 | HR 71 | Temp 97.9°F | Ht 66.0 in | Wt 226.0 lb

## 2024-06-04 DIAGNOSIS — E559 Vitamin D deficiency, unspecified: Secondary | ICD-10-CM

## 2024-06-04 DIAGNOSIS — G4733 Obstructive sleep apnea (adult) (pediatric): Secondary | ICD-10-CM

## 2024-06-04 DIAGNOSIS — K5903 Drug induced constipation: Secondary | ICD-10-CM

## 2024-06-04 DIAGNOSIS — E88819 Insulin resistance, unspecified: Secondary | ICD-10-CM

## 2024-06-04 DIAGNOSIS — Z6836 Body mass index (BMI) 36.0-36.9, adult: Secondary | ICD-10-CM

## 2024-06-04 DIAGNOSIS — E669 Obesity, unspecified: Secondary | ICD-10-CM

## 2024-06-04 DIAGNOSIS — K760 Fatty (change of) liver, not elsewhere classified: Secondary | ICD-10-CM

## 2024-06-04 MED ORDER — ZEPBOUND 12.5 MG/0.5ML ~~LOC~~ SOAJ
12.5000 mg | SUBCUTANEOUS | 1 refills | Status: DC
  Filled 2024-06-04 (×2): qty 2, 28d supply, fill #0

## 2024-06-18 ENCOUNTER — Telehealth (INDEPENDENT_AMBULATORY_CARE_PROVIDER_SITE_OTHER): Payer: Self-pay | Admitting: *Deleted

## 2024-06-18 NOTE — Telephone Encounter (Signed)
(  Key: [id number]) Zepbound  5MG /0.5ML pen-injectors Form [employer] Electronic PA Form [phone] NCPDP)

## 2024-06-18 NOTE — Telephone Encounter (Signed)
 Message from Plan CaseId:[id number]; Status:Approved;Review Type:Prior Auth;Coverage  Start Date:05/19/2024;Coverage End Date:06/18/2025 Authorization Expiration Date: June 18, 2025.   Patient notified via Mychart message.

## 2024-07-02 ENCOUNTER — Encounter (INDEPENDENT_AMBULATORY_CARE_PROVIDER_SITE_OTHER): Payer: Self-pay | Admitting: Physician Assistant

## 2024-07-02 ENCOUNTER — Other Ambulatory Visit (HOSPITAL_COMMUNITY): Payer: Self-pay

## 2024-07-02 ENCOUNTER — Ambulatory Visit (INDEPENDENT_AMBULATORY_CARE_PROVIDER_SITE_OTHER): Admitting: Physician Assistant

## 2024-07-02 VITALS — BP 125/83 | HR 66 | Temp 98.2°F | Ht 66.0 in | Wt 226.0 lb

## 2024-07-02 DIAGNOSIS — K5903 Drug induced constipation: Secondary | ICD-10-CM

## 2024-07-02 DIAGNOSIS — E669 Obesity, unspecified: Secondary | ICD-10-CM

## 2024-07-02 DIAGNOSIS — G4733 Obstructive sleep apnea (adult) (pediatric): Secondary | ICD-10-CM | POA: Diagnosis not present

## 2024-07-02 DIAGNOSIS — R202 Paresthesia of skin: Secondary | ICD-10-CM

## 2024-07-02 DIAGNOSIS — R632 Polyphagia: Secondary | ICD-10-CM

## 2024-07-02 DIAGNOSIS — Z6836 Body mass index (BMI) 36.0-36.9, adult: Secondary | ICD-10-CM

## 2024-07-02 DIAGNOSIS — E88819 Insulin resistance, unspecified: Secondary | ICD-10-CM

## 2024-07-02 MED ORDER — ZEPBOUND 12.5 MG/0.5ML ~~LOC~~ SOAJ
12.5000 mg | SUBCUTANEOUS | 1 refills | Status: DC
  Filled 2024-07-02: qty 2, 28d supply, fill #0

## 2024-07-02 NOTE — Progress Notes (Signed)
 SUBJECTIVE: Discussed the use of AI scribe software for clinical note transcription with the patient, who gave verbal consent to proceed.  Chief Complaint: Obesity  Interim History: She has maintained her weight since last visit. Down 33 lbs overall TBW loss of 12.7%  Brandi Ortiz is here to discuss her progress with her obesity treatment plan. She is on the Category 3 Plan and states she is following her eating plan approximately 60 % of the time. She states she is exercising cardio 30 minutes 1-2 times per week.  Brandi Ortiz is a 53 year old female with obesity who presents for follow-up of her obesity treatment plan.  She has been on Zepbound  12.5 mg weekly since June and takes cholecalciferol 2000 units daily, along with a multivitamin, turmeric, iron, and vitamin C supplements. She also uses Colace 100 mg twice daily as needed and regularly consumes Senna teas to manage constipation associated with Zepbound .  She experiences challenges with maintaining a consistent exercise routine due to her fluctuating work schedule, which alternates between 7 AM to 3 PM and 3 PM to 11 PM shifts. This inconsistency has made it difficult for her to exercise regularly, achieving only one to two days of exercise in the past month. She wants to reduce her work hours to part-time to better manage her health and routine.  She experiences hunger fluctuations related to her Zepbound  injections, noting that hunger is well-controlled immediately after the injection but increases as the week progresses. She manages hunger with small meals and snacks, including Sig[employer]'s 11-gram protein packs, especially after her late shifts.  She reports intermittent numbness and tingling in her right hand, described as 'pins and needles,' primarily affecting her whole right hand. The sensation is not present currently, but she does feel it involves the entire right hand, but nothing proximal to the wrist.   This sensation has  been more frequent since last Friday and is relieved by applying pressure. No neck pain, no weakness, and the symptoms do not wake her at night. She maintains regular chiropractic visits and massages for other issues. She has not seen her PCP concerning this new paresthesia of her right hand. OBJECTIVE: Visit Diagnoses: Problem List Items Addressed This Visit     Insulin  resistance   Polyphagia   OSA (obstructive sleep apnea) - Primary   Relevant Medications   tirzepatide  (ZEPBOUND ) 12.5 MG/0.5ML Pen   Drug-induced constipation   BMI 36.0-36.9,adult   Other Visit Diagnoses       Paresthesia of hand, right hand         Obesity (HCC)- Start BMI 41.8       Relevant Medications   tirzepatide  (ZEPBOUND ) 12.5 MG/0.5ML Pen     OSA using CPAP On Zepbound  12.5 mg weekly. ;Tried every other week dosing with limited success . Noted increased hunger and cravings on off  weeks.  Okayed for Zepbound  by insurance thru 05/2025 at this point.  Resume/refill Zepbound  12.5 mg weekly and monitor response. Intensive lifestyle modifications are the first line treatment for this issue. We discussed several lifestyle modifications today and she will continue to work on diet, exercise and weight loss efforts. We will continue to monitor. Orders and follow up as documented in patient record.  Meds ordered this encounter  Medications   tirzepatide  (ZEPBOUND ) 12.5 MG/0.5ML Pen    Sig: Inject 12.5 mg into the skin once a week.    Dispense:  2 mL    Refill:  1     Obesity  with polyphagia Obesity with polyphagia, managed with Zepbound  12.5 mg weekly for primary indication of OSA, but did go to every other week dosing. Noted increased hunger and cravings during the off weeks.  She reports difficulty maintaining exercise routine due to variable work schedule. Increased polyphagia on off weeks, but well controlled otherwise.  Approved for Zepbound  thru insurance until August 2026.  Discussed hunger  management and dietary choices, including use of protein snacks and low-calorie options. No significant cravings reported except on off weeks as noted, and some challenges with maintaining routine due to work schedule. - Continue Zepbound  12.5 mg weekly. - Resume weekly visits for obesity management. - Encourage maintaining a consistent exercise routine as work schedule allows. - Discussed dietary management strategies, including use of protein snacks and low-calorie options.   Insulin  Resistance Last fasting insulin  was 22.8- not at goal. A1c was 4.4- at goal. Polyphagia:Yes- when on every other week dosing.  Medication(s): Zepbound  12.5 mg SQ weekly Denies mass in neck, dysphagia, dyspepsia, persistent hoarseness, abdominal pain, or N/V/Constipation or diarrhea. Has annual eye exam. Mood is stable.  Constipation has been well managed with drinking senna tea and mindful of fluid intake/fiber intake.  Lab Results  Component Value Date   HGBA1C 4.4 (L) 03/11/2024   HGBA1C 4.6 (L) 09/04/2023   HGBA1C 4.5 (L) 03/05/2023   HGBA1C 5.1 06/08/2022   HGBA1C 4.8 07/19/2021   Lab Results  Component Value Date   INSULIN  22.8 03/11/2024   INSULIN  17.4 09/04/2023   INSULIN  41.6 (H) 03/05/2023   INSULIN  39.3 (H) 06/08/2022    Plan: Continue and refill Zepbound  12.5 mg SQ weekly Continue working on nutrition plan to decrease simple carbohydrates, increase lean proteins and exercise to promote weight loss, improve glycemic control and prevent progression to Type 2 diabetes.    Right hand paresthesia, possible carpal tunnel syndrome or radiculopathy Intermittent right hand paresthesia since last Friday, described as pins and needles sensation, primarily affecting the whole hand, including the index finger and thumb. No associated neck pain or weakness. Mild swelling noted on the right hand. Symptoms do not occur at night.  No symptoms proximal to wrist.  - Refer to primary care for evaluation and  potential referral to a hand specialist. - Consider use of a cock-up wrist splint to alleviate symptoms.   Vitals Temp: 98.2 F (36.8 C) BP: 125/83 Pulse Rate: 66 SpO2: 99 %   Anthropometric Measurements Height: 5' 6 (1.676 m) Weight: 226 lb (102.5 kg) BMI (Calculated): 36.49 Weight at Last Visit: 226 lb Weight Lost Since Last Visit: 0 Weight Gained Since Last Visit: 0 Starting Weight: 259 lb Total Weight Loss (lbs): 33 lb (15 kg)   Body Composition  Body Fat %: 39 % Fat Mass (lbs): 88.4 lbs Muscle Mass (lbs): 131.4 lbs Total Body Water (lbs): 87.8 lbs Visceral Fat Rating : 11   Other Clinical Data Fasting: No Labs: No Today's Visit #: 25 Starting Date: 06/08/22     ASSESSMENT AND PLAN:  Diet: Brandi Ortiz is currently in the action stage of change. As such, her goal is to continue with weight loss efforts. She has agreed to Category 3 Plan.  Exercise: Brandi Ortiz has been instructed to work up to a goal of 150 minutes of combined cardio and strengthening exercise per week for weight loss and overall health benefits.   Behavior Modification:  We discussed the following Behavioral Modification Strategies today: increasing lean protein intake, decreasing simple carbohydrates, increasing vegetables, increase H2O intake, increase high  fiber foods, no skipping meals, meal planning and cooking strategies, better snacking choices, avoiding temptations, and planning for success. We discussed various medication options to help Brandi Ortiz with her weight loss efforts and we both agreed to continue current treatment plan.  Return in about 4 weeks (around 07/30/2024).Brandi Ortiz She was informed of the importance of frequent follow up visits to maximize her success with intensive lifestyle modifications for her multiple health conditions.  Attestation Statements:   Reviewed by clinician on day of visit: allergies, medications, problem list, medical history, surgical history, family history,  social history, and previous encounter notes.   Time spent on visit including pre-visit chart review and post-visit care and charting was 41 minutes.    Brandi Encalada, PA-C

## 2024-07-29 NOTE — Progress Notes (Unsigned)
 SUBJECTIVE: Discussed the use of AI scribe software for clinical note transcription with the patient, who gave verbal consent to proceed.  Chief Complaint: Obesity  Interim History: She is up 2 lbs since her last visit.  Down 31 lbs overall TBW loss of ~ 12  Brandi Ortiz is here to discuss her progress with her obesity treatment plan. She is on the Category 3 Plan and states she is following her eating plan approximately 70 % of the time. She states she is not exercising due to changing work schedules.   Brandi Ortiz is a 53 year old female who presents for follow-up of her obesity treatment plan.  She has been on Zepbound  12.5 mg weekly for OSA and obesity management. Her irregular work schedule, which includes varying shifts and hours, has made it challenging to maintain a consistent exercise routine and balanced diet. This often results in skipped meals and insufficient protein intake. She attempts to focus on protein intake when possible, using options like Siggi's yogurt and protein-based Lunchables.  She has a history of obstructive sleep apnea and uses a CPAP machine. Her sleep schedule is disrupted due to her work hours, leading to insufficient sleep, typically around six hours per night. Occasionally, she sleeps without the CPAP but tries to use it regularly. She experiences variable restfulness, feeling both rested and tired at times, depending on her sleep quality. She had an unusual day where she slept for nine and a half hours due to having no obligations.  She has a history of insulin  resistance and metabolic associated fatty liver disease. She was previously on cholecalciferol 2000 units daily for vitamin D  deficiency and a multivitamin daily. She has not taken Pepcid for about two months and reports not having issues with regurgitation or acid reflux at night.  She manages constipation with Colace 100 mg twice daily. She has experienced gastrointestinal symptoms, such as  needing to use the bathroom urgently, which has occasionally impacted her work schedule.  She has a history of arthritis in her hands, which she associates with the consumption of energy drinks like Celsius. She has stopped drinking these as they exacerbate her hand pain when she is not exercising. OBJECTIVE: Visit Diagnoses: Problem List Items Addressed This Visit     Vitamin D  deficiency   Insulin  resistance   Polyphagia   OSA (obstructive sleep apnea) - Primary   Relevant Medications   tirzepatide  (ZEPBOUND ) 12.5 MG/0.5ML Pen   Drug-induced constipation   BMI 36.0-36.9,adult   Other Visit Diagnoses       Obesity (HCC)- Start BMI 41.8       Relevant Medications   tirzepatide  (ZEPBOUND ) 12.5 MG/0.5ML Pen     Obesity with polyphagia  Obesity management is ongoing with a focus on weight loss and lifestyle modifications. She has lost 31 pounds, exceeding her initial goal, but aims to reach a weight in the 200-pound range. Her work schedule and stress levels are impacting her ability to maintain a consistent exercise routine and meal schedule, contributing to challenges in weight management. She is attempting to adjust her medication schedule to improve regulation and adherence.Polyphagia is well controlled overall.  She is skipping meals at times due to work schedule, but trying to make sure she gets adequate protein. She has been trying to go to every other week dosing of medication, but finds it more difficult to stay on plan with qoweek and resumed weekly dose. - Continue Zepbound  12.5 mg weekly - Encourage regular exercise as schedule permits -  Advise on maintaining a balanced diet with adequate protein intake - Discuss strategies to manage work-related stress and improve sleep patterns  Obstructive sleep apnea Obstructive sleep apnea is managed with CPAP therapy. Inconsistent sleep patterns due to work schedule may affect the quality of sleep and CPAP adherence. On Zepbound  for  OSA that remains symptomatic.  Plan: Continue/refill Zepbound  12.5 mg weekly. - Continue CPAP therapy - Encourage consistent sleep schedule as much as possible Meds ordered this encounter  Medications   tirzepatide  (ZEPBOUND ) 12.5 MG/0.5ML Pen    Sig: Inject 12.5 mg into the skin once a week.    Dispense:  2 mL    Refill:  1    Insulin  Resistance Last fasting insulin  was 22.8- increased at last check and not at goal. A1c was 4.4- at goal. Polyphagia:No-  Medication(s): Zepbound  12.5 mg SQ weekly Constipation managed with stool softner, but otherwise Denies mass in neck, dysphagia, dyspepsia, persistent hoarseness, abdominal pain, or N/V or diarrhea. Has annual eye exam. Mood is stable.   Lab Results  Component Value Date   HGBA1C 4.4 (L) 03/11/2024   HGBA1C 4.6 (L) 09/04/2023   HGBA1C 4.5 (L) 03/05/2023   HGBA1C 5.1 06/08/2022   HGBA1C 4.8 07/19/2021   Lab Results  Component Value Date   INSULIN  22.8 03/11/2024   INSULIN  17.4 09/04/2023   INSULIN  41.6 (H) 03/05/2023   INSULIN  39.3 (H) 06/08/2022    Plan: Continue and refill Zepbound  12.5 mg SQ weekly Continue working on nutrition plan to decrease simple carbohydrates, increase lean proteins and exercise to promote weight loss, improve glycemic control and prevent progression to Type 2 diabetes.    Vitamin D  deficiency Vitamin D  deficiency is managed with supplementation. She continues to take cholecalciferol 2000 units daily. Pepcid has been discontinued as it may interfere with vitamin D  absorption. - Continue cholecalciferol 2000 units daily - Plan to check vitamin D  levels at next visit  Constipation Constipation is managed with Colace 100 mg twice daily. Likely related to GLP/GIP medication use.  - Continue Colace 100 mg twice daily Continue to hydrate well daily.     Vitals Temp: 97.9 F (36.6 C) BP: 119/78 Pulse Rate: 63 SpO2: 98 %   Anthropometric Measurements Height: 5' 6 (1.676 m) Weight: 228 lb  (103.4 kg) BMI (Calculated): 36.82 Weight at Last Visit: 226 lb Weight Lost Since Last Visit: 0 Weight Gained Since Last Visit: 2 lb Starting Weight: 259 lb Total Weight Loss (lbs): 31 lb (14.1 kg) Peak Weight: 270 lb   Body Composition  Body Fat %: 41 % Fat Mass (lbs): 93.8 lbs Muscle Mass (lbs): 128 lbs Total Body Water (lbs): 90.6 lbs Visceral Fat Rating : 11   Other Clinical Data Fasting: No Labs: No Today's Visit #: 26 Starting Date: 06/08/22     ASSESSMENT AND PLAN:  Diet: Brandi Ortiz is currently in the action stage of change. As such, her goal is to continue with weight loss efforts. She has agreed to Category 3 Plan.  Exercise: Brandi Ortiz has been instructed to work up to a goal of 150 minutes of combined cardio and strengthening exercise per week for weight loss and overall health benefits.   Behavior Modification:  We discussed the following Behavioral Modification Strategies today: increasing lean protein intake, decreasing simple carbohydrates, increasing vegetables, increase H2O intake, increase high fiber foods, no skipping meals, meal planning and cooking strategies, avoiding temptations, and planning for success. We discussed various medication options to help Brandi Ortiz with her weight loss efforts  and we both agreed to continue current treatment plan.  Return in about 4 weeks (around 08/27/2024) for Fasting Lab.Brandi Ortiz She was informed of the importance of frequent follow up visits to maximize her success with intensive lifestyle modifications for her multiple health conditions.  Attestation Statements:   Reviewed by clinician on day of visit: allergies, medications, problem list, medical history, surgical history, family history, social history, and previous encounter notes.   Time spent on visit including pre-visit chart review and post-visit care and charting was 35 minutes.    Brandi Weiss, PA-C

## 2024-07-30 ENCOUNTER — Other Ambulatory Visit (HOSPITAL_COMMUNITY): Payer: Self-pay

## 2024-07-30 ENCOUNTER — Ambulatory Visit (INDEPENDENT_AMBULATORY_CARE_PROVIDER_SITE_OTHER): Admitting: Physician Assistant

## 2024-07-30 ENCOUNTER — Encounter (INDEPENDENT_AMBULATORY_CARE_PROVIDER_SITE_OTHER): Payer: Self-pay | Admitting: Physician Assistant

## 2024-07-30 VITALS — BP 119/78 | HR 63 | Temp 97.9°F | Ht 66.0 in | Wt 228.0 lb

## 2024-07-30 DIAGNOSIS — R632 Polyphagia: Secondary | ICD-10-CM

## 2024-07-30 DIAGNOSIS — E88819 Insulin resistance, unspecified: Secondary | ICD-10-CM | POA: Diagnosis not present

## 2024-07-30 DIAGNOSIS — E669 Obesity, unspecified: Secondary | ICD-10-CM

## 2024-07-30 DIAGNOSIS — G4733 Obstructive sleep apnea (adult) (pediatric): Secondary | ICD-10-CM | POA: Diagnosis not present

## 2024-07-30 DIAGNOSIS — K760 Fatty (change of) liver, not elsewhere classified: Secondary | ICD-10-CM

## 2024-07-30 DIAGNOSIS — Z6836 Body mass index (BMI) 36.0-36.9, adult: Secondary | ICD-10-CM

## 2024-07-30 DIAGNOSIS — E559 Vitamin D deficiency, unspecified: Secondary | ICD-10-CM

## 2024-07-30 DIAGNOSIS — R202 Paresthesia of skin: Secondary | ICD-10-CM

## 2024-07-30 DIAGNOSIS — K5903 Drug induced constipation: Secondary | ICD-10-CM

## 2024-07-30 MED ORDER — ZEPBOUND 12.5 MG/0.5ML ~~LOC~~ SOAJ
12.5000 mg | SUBCUTANEOUS | 1 refills | Status: DC
  Filled 2024-07-30: qty 2, 28d supply, fill #0

## 2024-08-18 ENCOUNTER — Encounter: Payer: Self-pay | Admitting: Nurse Practitioner

## 2024-08-18 NOTE — Telephone Encounter (Signed)
 Can we call and get the patient scheduled for a CPE or 10/15/2024 or slightly after. Any slot will do

## 2024-08-19 NOTE — Telephone Encounter (Signed)
Left detailed message for patient to call and schedule.

## 2024-08-26 NOTE — Progress Notes (Unsigned)
 SUBJECTIVE: Discussed the use of AI scribe software for clinical note transcription with the patient, who gave verbal consent to proceed.  Chief Complaint: Obesity  Interim History: Brandi Ortiz is down 4 lbs since her last visit.  Down 35 lbs overall TBW loss of 13.5%  Brandi Ortiz is here to discuss her progress with her obesity treatment plan. Brandi Ortiz is on the Category 3 Plan and states Brandi Ortiz is following her eating plan approximately 75 % of the time. Brandi Ortiz states Brandi Ortiz is exercising starting back with regular exercise over the past week.   Brandi Ortiz is a 53 year old female who presents for follow-up of her obesity treatment plan.  Brandi Ortiz is adhering to her category three obesity treatment plan approximately 75% of the time, resulting in a total weight loss of 35 pounds, including a 4-pound loss since her last visit. Brandi Ortiz is consuming more whole foods and meeting her protein requirements but struggles with consistent water intake. Brandi Ortiz has resumed exercising but is not achieving seven or more hours of sleep per night.  Her past medical history includes obstructive sleep apnea, for which Brandi Ortiz uses CPAP, insulin  resistance, vitamin D  deficiency, drug-induced constipation, and nonalcoholic fatty liver disease. For constipation, Brandi Ortiz uses a specific tea  which Brandi Ortiz finds effective and easy to incorporate into her routine, consuming a cup daily at any time.  Her current medication regimen includes a weekly injection, which Brandi Ortiz resumed and finds helpful, and meloxicam . Brandi Ortiz ensures adequate protein intake to avoid adverse effects such as hunger and pain. Brandi Ortiz reports a decent energy level and a manageable work schedule, although Brandi Ortiz worked six consecutive days recently.  Brandi Ortiz does not skip meals but adjusts her meal composition based on her work schedule, often consuming a 'protein adult lunchable', protein drink, water, and fruit as a night meal. Brandi Ortiz is preparing for upcoming lab tests and has not eaten or drunk  anything today in anticipation. Brandi Ortiz is also planning for Thanksgiving, where Brandi Ortiz will be responsible for cooking a turkey and we discussed holiday strategies of portion control and mindful eating.  Fasting labs obtained today. The patient was informed we would discuss the lab results at the next visit unless there is a critical issue that needs to be addressed sooner. The patient agreed to keep the next visit at the agreed upon time to discuss these results.   OBJECTIVE: Visit Diagnoses: Problem List Items Addressed This Visit     Vitamin D  deficiency   Relevant Orders   VITAMIN D  25 Hydroxy (Vit-D Deficiency, Fractures)   NAFLD (nonalcoholic fatty liver disease)   Other fatigue   Relevant Orders   Vitamin B12   CBC with Differential/Platelet   TSH   Insulin  resistance   Relevant Orders   CMP14+EGFR   Hemoglobin A1c   Insulin , random   Lipid Panel With LDL/HDL Ratio   Polyphagia   OSA (obstructive sleep apnea) - Primary   Relevant Medications   tirzepatide  (ZEPBOUND ) 12.5 MG/0.5ML Pen   Drug-induced constipation   BMI 36.0-36.9,adult   Other Visit Diagnoses       Obesity (HCC)- Start BMI 41.8       Relevant Medications   tirzepatide  (ZEPBOUND ) 12.5 MG/0.5ML Pen     Obesity with polyphagia Obesity with a total weight loss of 35 pounds. Following a category three plan approximately 75% of the time. Increased physical activity has contributed to weight loss. Consuming more whole foods and adequate protein. Struggles with consistent water intake. No meal skipping, but meal  timing varies due to work schedule. Energy levels are decent despite a busy work schedule. Polyphagia well controlled.  - Continue current weight management plan - Encouraged consistent water intake - Encouraged regular physical activity - Recheck fasting labs today.   OSA using CPAP Intensive lifestyle modifications are the first line treatment for this issue. We discussed several lifestyle modifications  today and Brandi Ortiz will continue to work on diet, exercise and weight loss efforts. -Goal: Treatment of OSA via CPAP compliance and weight loss. Brandi Ortiz remains symptomatic with her OSA.  Brandi Ortiz continues on Zepbound  12.5 mg weekly- refilled today.  Plasma ghrelin levels (appetite or "hunger hormone") are significantly higher in OSA patients than in BMI-matched controls, but decrease to levels similar to those of obese patients without OSA after CPAP treatment.  Weight loss improves OSA by several mechanisms, including reduction in fatty tissue in the throat (i.e. parapharyngeal fat) and the tongue. Loss of abdominal fat increases mediastinal traction on the upper airway making it less likely to collapse during sleep. Studies have also shown that compliance with CPAP treatment improves leptin imbalance. Meds ordered this encounter  Medications   tirzepatide  (ZEPBOUND ) 12.5 MG/0.5ML Pen    Sig: Inject 12.5 mg into the skin once a week.    Dispense:  2 mL    Refill:  1    Insulin  Resistance Last fasting insulin  was 22.8- not at goal. A1c was 4.4- exceeded goal. Polyphagia:No Medication(s): Zepbound  12.5 mg SQ weekly Denies mass in neck, dysphagia, dyspepsia, persistent hoarseness, abdominal pain, or N/V or diarrhea. Has annual eye exam. Mood is stable.  Constipation managed with daily tea for constipation.  Lab Results  Component Value Date   HGBA1C 4.4 (L) 03/11/2024   HGBA1C 4.6 (L) 09/04/2023   HGBA1C 4.5 (L) 03/05/2023   HGBA1C 5.1 06/08/2022   HGBA1C 4.8 07/19/2021   Lab Results  Component Value Date   INSULIN  22.8 03/11/2024   INSULIN  17.4 09/04/2023   INSULIN  41.6 (H) 03/05/2023   INSULIN  39.3 (H) 06/08/2022    Plan: Continue and refill Zepbound  12.5 mg SQ weekly for primary indication of OSA Continue working on nutrition plan to decrease simple carbohydrates, increase lean proteins and exercise to promote weight loss, improve glycemic control and prevent progression to Type 2  diabetes.    Vitamin D  deficiency On OTC vitamin D  2000 units daily without SE.  Last vitamin D  Lab Results  Component Value Date   VD25OH 30.2 03/11/2024  Low vitamin D  levels can be associated with adiposity and may result in leptin resistance and weight gain. Also associated with fatigue.  Currently on vitamin D  supplementation without any adverse effects such as nausea, vomiting or muscle weakness.  Recheck vitamin D  level today and adjust supplementation as indicated.    Fatigue Overall improved energy levels with intermittent fatigue.  Plan Recheck vitamin D , B 12 levels and CBC as well as TSH   Drug-induced constipation Managed effectively with a specific tea regimen. The tea is effective, though costly, and is taken daily to maintain bowel movements. - Continue current tea regimen for constipation management  MAFLD History of hepatic steatosis on remote US  in past.  Current FIB 4 = 0.93- likely excludes advanced fibrosis. CT ABD 09/2023 without steatosis.  Plan:   Intensive lifestyle modifications are the first line treatment for this issue.  We discussed several lifestyle modifications today and Brandi Ortiz will continue to work on diet, exercise and weight loss efforts.  Brandi Ortiz has lost 13.5% on current management  plan.  Liver function tests to be included in upcoming labs. - Ordered liver function tests as part of lab work  Vitals Temp: 98 F (36.7 C) BP: 110/72 Pulse Rate: 66 SpO2: 97 %   Anthropometric Measurements Height: 5' 6 (1.676 m) Weight: 224 lb (101.6 kg) BMI (Calculated): 36.17 Weight at Last Visit: 228 lb Weight Lost Since Last Visit: 4 lb Weight Gained Since Last Visit: 0 Starting Weight: 259 lb Total Weight Loss (lbs): 35 lb (15.9 kg) Peak Weight: 270 lb   Body Composition  Body Fat %: 39.6 % Fat Mass (lbs): 88.8 lbs Muscle Mass (lbs): 128.6 lbs Total Body Water (lbs): 88 lbs Visceral Fat Rating : 11   Other Clinical Data Fasting: Yes Labs:  Yes Today's Visit #: 27 Starting Date: 06/08/22     ASSESSMENT AND PLAN:  Diet: Brandi Ortiz is currently in the action stage of change. As such, her goal is to continue with weight loss efforts. Brandi Ortiz has agreed to Category 3 Plan.  Exercise: Brandi Ortiz has been instructed to work up to a goal of 150 minutes of combined cardio and strengthening exercise per week and to continue exercising as is for weight loss and overall health benefits.   Behavior Modification:  We discussed the following Behavioral Modification Strategies today: increasing lean protein intake, decreasing simple carbohydrates, increasing vegetables, increase H2O intake, increase high fiber foods, meal planning and cooking strategies, holiday eating strategies, avoiding temptations, and planning for success. We discussed various medication options to help Brandi Ortiz with her weight loss efforts and we both agreed to continue current treatment plan.  Return in about 4 weeks (around 09/24/2024).Brandi Ortiz Brandi Ortiz was informed of the importance of frequent follow up visits to maximize her success with intensive lifestyle modifications for her multiple health conditions.  Attestation Statements:   Reviewed by clinician on day of visit: allergies, medications, problem list, medical history, surgical history, family history, social history, and previous encounter notes.   Time spent on visit including pre-visit chart review and post-visit care and charting was 32 minutes.    Brandi Aung, PA-C

## 2024-08-27 ENCOUNTER — Ambulatory Visit (INDEPENDENT_AMBULATORY_CARE_PROVIDER_SITE_OTHER): Payer: Self-pay | Admitting: Physician Assistant

## 2024-08-27 ENCOUNTER — Encounter (INDEPENDENT_AMBULATORY_CARE_PROVIDER_SITE_OTHER): Payer: Self-pay | Admitting: Physician Assistant

## 2024-08-27 ENCOUNTER — Other Ambulatory Visit (HOSPITAL_COMMUNITY): Payer: Self-pay

## 2024-08-27 VITALS — BP 110/72 | HR 66 | Temp 98.0°F | Ht 66.0 in | Wt 224.0 lb

## 2024-08-27 DIAGNOSIS — K760 Fatty (change of) liver, not elsewhere classified: Secondary | ICD-10-CM

## 2024-08-27 DIAGNOSIS — K5903 Drug induced constipation: Secondary | ICD-10-CM

## 2024-08-27 DIAGNOSIS — E88819 Insulin resistance, unspecified: Secondary | ICD-10-CM | POA: Diagnosis not present

## 2024-08-27 DIAGNOSIS — E559 Vitamin D deficiency, unspecified: Secondary | ICD-10-CM | POA: Diagnosis not present

## 2024-08-27 DIAGNOSIS — R632 Polyphagia: Secondary | ICD-10-CM

## 2024-08-27 DIAGNOSIS — G4733 Obstructive sleep apnea (adult) (pediatric): Secondary | ICD-10-CM

## 2024-08-27 DIAGNOSIS — E669 Obesity, unspecified: Secondary | ICD-10-CM

## 2024-08-27 DIAGNOSIS — Z6836 Body mass index (BMI) 36.0-36.9, adult: Secondary | ICD-10-CM

## 2024-08-27 DIAGNOSIS — R5383 Other fatigue: Secondary | ICD-10-CM

## 2024-08-27 MED ORDER — ZEPBOUND 12.5 MG/0.5ML ~~LOC~~ SOAJ
12.5000 mg | SUBCUTANEOUS | 1 refills | Status: DC
  Filled 2024-08-27: qty 2, 28d supply, fill #0

## 2024-08-28 LAB — CMP14+EGFR
ALT: 29 IU/L (ref 0–32)
AST: 30 IU/L (ref 0–40)
Albumin: 4.4 g/dL (ref 3.8–4.9)
Alkaline Phosphatase: 143 IU/L — ABNORMAL HIGH (ref 49–135)
BUN/Creatinine Ratio: 22 (ref 9–23)
BUN: 20 mg/dL (ref 6–24)
Bilirubin Total: 0.6 mg/dL (ref 0.0–1.2)
CO2: 21 mmol/L (ref 20–29)
Calcium: 8.9 mg/dL (ref 8.7–10.2)
Chloride: 103 mmol/L (ref 96–106)
Creatinine, Ser: 0.89 mg/dL (ref 0.57–1.00)
Globulin, Total: 2.4 g/dL (ref 1.5–4.5)
Glucose: 78 mg/dL (ref 70–99)
Potassium: 4 mmol/L (ref 3.5–5.2)
Sodium: 139 mmol/L (ref 134–144)
Total Protein: 6.8 g/dL (ref 6.0–8.5)
eGFR: 78 mL/min/1.73 (ref >59)

## 2024-08-28 LAB — VITAMIN D 25 HYDROXY (VIT D DEFICIENCY, FRACTURES): Vit D, 25-Hydroxy: 32.8 ng/mL (ref 30.0–100.0)

## 2024-08-28 LAB — CBC WITH DIFFERENTIAL/PLATELET
Basophils Absolute: 0 x10E3/uL (ref 0.0–0.2)
Basos: 0 %
EOS (ABSOLUTE): 0.2 x10E3/uL (ref 0.0–0.4)
Eos: 3 %
Hematocrit: 42.9 % (ref 34.0–46.6)
Hemoglobin: 13.9 g/dL (ref 11.1–15.9)
Immature Grans (Abs): 0 x10E3/uL (ref 0.0–0.1)
Immature Granulocytes: 0 %
Lymphocytes Absolute: 2.1 x10E3/uL (ref 0.7–3.1)
Lymphs: 33 %
MCH: 27.7 pg (ref 26.6–33.0)
MCHC: 32.4 g/dL (ref 31.5–35.7)
MCV: 86 fL (ref 79–97)
Monocytes Absolute: 0.3 x10E3/uL (ref 0.1–0.9)
Monocytes: 5 %
Neutrophils Absolute: 3.8 x10E3/uL (ref 1.4–7.0)
Neutrophils: 59 %
Platelets: 232 x10E3/uL (ref 150–450)
RBC: 5.01 x10E6/uL (ref 3.77–5.28)
RDW: 14 % (ref 11.7–15.4)
WBC: 6.5 x10E3/uL (ref 3.4–10.8)

## 2024-08-28 LAB — HEMOGLOBIN A1C
Est. average glucose Bld gHb Est-mCnc: 80 mg/dL
Hgb A1c MFr Bld: 4.4 % — ABNORMAL LOW (ref 4.8–5.6)

## 2024-08-28 LAB — INSULIN, RANDOM: INSULIN: 16.8 u[IU]/mL (ref 2.6–24.9)

## 2024-08-28 LAB — LIPID PANEL WITH LDL/HDL RATIO
Cholesterol, Total: 142 mg/dL (ref 100–199)
HDL: 52 mg/dL (ref >39)
LDL Chol Calc (NIH): 74 mg/dL (ref 0–99)
LDL/HDL Ratio: 1.4 ratio (ref 0.0–3.2)
Triglycerides: 85 mg/dL (ref 0–149)
VLDL Cholesterol Cal: 16 mg/dL (ref 5–40)

## 2024-08-28 LAB — TSH: TSH: 1.42 u[IU]/mL (ref 0.450–4.500)

## 2024-08-28 LAB — VITAMIN B12: Vitamin B-12: 731 pg/mL (ref 232–1245)

## 2024-09-23 NOTE — Progress Notes (Unsigned)
 SUBJECTIVE: Discussed the use of AI scribe software for clinical note transcription with the patient, who gave verbal consent to proceed.  Chief Complaint: Obesity  Interim History: She is up 1 lb since her last visit.  Down 34 lbs overall TBW Loss of 13.1%  Brandi Ortiz is here to discuss her progress with her obesity treatment plan. She is on the Category 3 Plan and states she is following her eating plan approximately 60 % of the time. She states she is exercising walking 30  minutes 1 times per week.  Brandi Ortiz is a 53 year old female who presents for follow-up of her obesity treatment plan.  She has lost 34 pounds and is currently on Zepbound  12.5 mg once weekly. There has been a slight increase in weight recently, but she remains significantly down from her initial weight. She is interested in reviewing her recent lab results and discussing her progress.  She has a history of insulin  resistance and experiences occasional symptoms of feeling shaky if she waits too long to eat, particularly at work. She manages these episodes by keeping crackers and protein drinks on hand. Her A1c remains low at 4.4, and she does not have prediabetes. She is not on any diabetes medications.  She has a history of metabolically associated liver disease with previous evidence of fat infiltration in the liver. Her alkaline phosphatase levels remain elevated, but a recent fib-4 score was low, and a CT scan from December of last year showed no fatty liver.  She is currently taking cholecalciferol 2000 units daily for vitamin D  deficiency, but her levels remain on the lower end. Her vitamin B12 levels are good, and her hemoglobin, hematocrit, white blood cell count, and platelets are all normal.  Her cholesterol levels are good, with HDL slightly decreased, likely due to reduced exercise. Her triglycerides and LDL cholesterol are within normal limits. She is mindful of her diet, avoiding saturated fats and  focusing on lean meats.  She does not have a history of polycystic ovary syndrome and has not been informed of any ovarian cysts. She maintains regular mammograms and gynecological check-ups, although she has not had a Pap smear since 2017. OBJECTIVE: Visit Diagnoses: Problem List Items Addressed This Visit     Vitamin D  deficiency   Insulin  resistance   Polyphagia - Primary   BMI 36.0-36.9,adult   Other Visit Diagnoses       Metabolic dysfunction-associated fatty liver disease (MAFLD)         Obesity (HCC)- Start BMI 41.8       Relevant Medications   tirzepatide  (ZEPBOUND ) 12.5 MG/0.5ML Pen     Obesity with polyphagia Management is ongoing with significant weight loss of 34 pounds, equating to a 13.5% reduction in total body weight. Current treatment includes Zepbound  12.5 mg once weekly. She is advised to maintain dietary habits and consider protein loading before events to manage appetite. Denies mass in neck, dysphagia, dyspepsia, persistent hoarseness, abdominal pain, or N/V/Constipation or diarrhea. Has annual eye exam. Mood is stable.  Polyphagia moderate to well controlled.  - Continue Zepbound  12.5 mg once weekly - Encouraged protein loading before events to manage appetite Meds ordered this encounter  Medications   tirzepatide  (ZEPBOUND ) 12.5 MG/0.5ML Pen    Sig: Inject 12.5 mg into the skin once a week.    Dispense:  2 mL    Refill:  1    Insulin  resistance Present but improving, with a reduction in insulin  resistance score from 22 to  16. A1c remains low at 4.4, with occasional hypoglycemic symptoms when meals are delayed. No current need for continuous glucose monitoring unless symptoms worsen. - Continue to monitor dietary habits and ensure regular meals to prevent hypoglycemia - Will consider continuous glucose monitoring if symptoms worsen  Metabolic dysfunction-associated fatty liver disease Metabolic dysfunction-associated fatty liver disease appears to be in  remission, likely due to significant weight loss and GLP-1 therapy.  Liver enzymes- (alk Phos only) are slightly elevated, but fib-4 score is low, excluding significant fibrosis and CT ABD /pelvis in 09/2023 did not show ongoing evidence of hepatic steatosis. Alcohol consumption and liver toxic medications should be monitored. Lab Results  Component Value Date   ALT 29 08/27/2024   AST 30 08/27/2024   ALKPHOS 143 (H) 08/27/2024   BILITOT 0.6 08/27/2024  Plan:  Lab results reviewed with patient.  Disease counseling done.  Intensive lifestyle modifications are the first line treatment for this issue.  We discussed several lifestyle modifications today and she will continue to work on diet, exercise and weight loss efforts.   Counseling: MAFLD is an umbrella term that encompasses a disease spectrum that includes steatosis (fat) without inflammation, steatohepatitis (MASH; fat + inflammation in a characteristic pattern), and cirrhosis. Bland steatosis is felt to be a benign condition, with extremely low to no risk of progression to cirrhosis, whereas MASH can progress to cirrhosis. The mainstay of treatment of MAFLD includes lifestyle modification to achieve weight loss, at least 7% of current body weight. Low carbohydrate diets can be beneficial in improving MAFLD liver histology. Additionally, exercise, even the absence of weight loss can have beneficial effects on the patient's metabolic profile and liver health. We recommend that their metabolic comorbidities be aggressively managed, as patients with MAFLD are at increased risk of coronary artery disease. - Advised minimizing alcohol consumption to no more than 3 drinks per week - Advised monitoring for liver toxic medications and supplements  Vitamin D  deficiency Current supplementation is 2000 units daily. No N/V or muscle weakness with OTC vitamin D .  Last vitamin D  Lab Results  Component Value Date   VD25OH 32.8 08/27/2024   Low vitamin  D levels can be associated with adiposity and may result in leptin resistance and weight gain. Also associated with fatigue.  Currently on vitamin D  supplementation without any adverse effects such as nausea, vomiting or muscle weakness.  - Increased cholecalciferol to 3000 units daily - Will recheck vitamin D  levels in the future  General Health Maintenance Routine health maintenance is up to date with normal lab results, including electrolytes, kidney function, cholesterol levels, vitamin B12, hemoglobin, hematocrit, white blood cell count, and platelets. A1c is low but not concerning due to lack of symptoms. - Continue routine health maintenance and screenings - Ensure regular OB GYN visits and mammograms  Vitals Temp: 98.2 F (36.8 C) BP: 114/73 Pulse Rate: 73 SpO2: 99 %   Anthropometric Measurements Height: 5' 6 (1.676 m) Weight: 225 lb (102.1 kg) BMI (Calculated): 36.33 Weight at Last Visit: 224 lbs Weight Lost Since Last Visit: 0 Weight Gained Since Last Visit: 1 lbs Starting Weight: 259 lbs Total Weight Loss (lbs): 34 lb (15.4 kg) Peak Weight: 270 lbs   Body Composition  Body Fat %: 40.6 % Fat Mass (lbs): 91.4 lbs Muscle Mass (lbs): 126.8 lbs Total Body Water (lbs): 89.4 lbs Visceral Fat Rating : 12   Other Clinical Data Fasting: no Today's Visit #: 28 Starting Date: 06/08/22     ASSESSMENT AND  PLAN:  Diet: Brandi Ortiz is currently in the action stage of change. As such, her goal is to continue with weight loss efforts. She has agreed to Category 3 Plan.  Exercise: Brandi Ortiz has been instructed to work up to a goal of 150 minutes of combined cardio and strengthening exercise per week for weight loss and overall health benefits.   Behavior Modification:  We discussed the following Behavioral Modification Strategies today: increasing lean protein intake, decreasing simple carbohydrates, increasing vegetables, increase H2O intake, increase high fiber foods,  no skipping meals, meal planning and cooking strategies, holiday eating strategies, avoiding temptations, and planning for success. We discussed various medication options to help Brandi Ortiz with her weight loss efforts and we both agreed to continue Zepbound  12.5 mg weekly.  Return in about 5 weeks (around 10/29/2024) for Fasting IC- Arrive at least 30 minutes prior to visit to complete IC testing. .. She was informed of the importance of frequent follow up visits to maximize her success with intensive lifestyle modifications for her multiple health conditions.  Attestation Statements:   Reviewed by clinician on day of visit: allergies, medications, problem list, medical history, surgical history, family history, social history, and previous encounter notes.   Time spent on visit including pre-visit chart review and post-visit care and charting was 33 minutes.    Mysty Kielty, PA-C

## 2024-09-24 ENCOUNTER — Other Ambulatory Visit (HOSPITAL_COMMUNITY): Payer: Self-pay

## 2024-09-24 ENCOUNTER — Encounter (INDEPENDENT_AMBULATORY_CARE_PROVIDER_SITE_OTHER): Payer: Self-pay | Admitting: Physician Assistant

## 2024-09-24 ENCOUNTER — Ambulatory Visit (INDEPENDENT_AMBULATORY_CARE_PROVIDER_SITE_OTHER): Payer: Self-pay | Admitting: Physician Assistant

## 2024-09-24 VITALS — BP 114/73 | HR 73 | Temp 98.2°F | Ht 66.0 in | Wt 225.0 lb

## 2024-09-24 DIAGNOSIS — E559 Vitamin D deficiency, unspecified: Secondary | ICD-10-CM | POA: Diagnosis not present

## 2024-09-24 DIAGNOSIS — E669 Obesity, unspecified: Secondary | ICD-10-CM

## 2024-09-24 DIAGNOSIS — G4733 Obstructive sleep apnea (adult) (pediatric): Secondary | ICD-10-CM

## 2024-09-24 DIAGNOSIS — E88819 Insulin resistance, unspecified: Secondary | ICD-10-CM

## 2024-09-24 DIAGNOSIS — R632 Polyphagia: Secondary | ICD-10-CM

## 2024-09-24 DIAGNOSIS — K760 Fatty (change of) liver, not elsewhere classified: Secondary | ICD-10-CM | POA: Diagnosis not present

## 2024-09-24 DIAGNOSIS — Z6836 Body mass index (BMI) 36.0-36.9, adult: Secondary | ICD-10-CM

## 2024-09-24 DIAGNOSIS — K5903 Drug induced constipation: Secondary | ICD-10-CM

## 2024-09-24 MED ORDER — ZEPBOUND 12.5 MG/0.5ML ~~LOC~~ SOAJ
12.5000 mg | SUBCUTANEOUS | 1 refills | Status: DC
  Filled 2024-09-24: qty 2, 28d supply, fill #0

## 2024-10-14 ENCOUNTER — Encounter (INDEPENDENT_AMBULATORY_CARE_PROVIDER_SITE_OTHER): Payer: Self-pay | Admitting: Physician Assistant

## 2024-10-27 ENCOUNTER — Ambulatory Visit (INDEPENDENT_AMBULATORY_CARE_PROVIDER_SITE_OTHER): Admitting: Physician Assistant

## 2024-10-27 ENCOUNTER — Other Ambulatory Visit (HOSPITAL_COMMUNITY): Payer: Self-pay

## 2024-10-27 ENCOUNTER — Encounter (INDEPENDENT_AMBULATORY_CARE_PROVIDER_SITE_OTHER): Payer: Self-pay | Admitting: Nurse Practitioner

## 2024-10-27 VITALS — BP 103/70 | HR 82 | Temp 98.4°F | Ht 66.0 in | Wt 223.0 lb

## 2024-10-27 DIAGNOSIS — E88819 Insulin resistance, unspecified: Secondary | ICD-10-CM | POA: Diagnosis not present

## 2024-10-27 DIAGNOSIS — G4733 Obstructive sleep apnea (adult) (pediatric): Secondary | ICD-10-CM | POA: Diagnosis not present

## 2024-10-27 DIAGNOSIS — E66812 Obesity, class 2: Secondary | ICD-10-CM

## 2024-10-27 DIAGNOSIS — R632 Polyphagia: Secondary | ICD-10-CM

## 2024-10-27 DIAGNOSIS — E559 Vitamin D deficiency, unspecified: Secondary | ICD-10-CM | POA: Diagnosis not present

## 2024-10-27 DIAGNOSIS — Z6836 Body mass index (BMI) 36.0-36.9, adult: Secondary | ICD-10-CM | POA: Diagnosis not present

## 2024-10-27 MED ORDER — ZEPBOUND 12.5 MG/0.5ML ~~LOC~~ SOAJ
12.5000 mg | SUBCUTANEOUS | 0 refills | Status: DC
  Filled 2024-10-27: qty 2, 28d supply, fill #0

## 2024-10-27 NOTE — Progress Notes (Signed)
 " Office: [phone]  /  Fax: [phone]  WEIGHT SUMMARY AND BIOMETRICS  Weight Lost Since Last Visit: 2 lb  Weight Gained Since Last Visit: 0   Vitals Temp: 98.4 F (36.9 C) BP: 103/70 Pulse Rate: 82 SpO2: 98 %   Anthropometric Measurements Height: 5' 6 (1.676 m) Weight: 223 lb (101.2 kg) BMI (Calculated): 36.01 Weight at Last Visit: 225 lb Weight Lost Since Last Visit: 2 lb Weight Gained Since Last Visit: 0 Starting Weight: 259 lb Total Weight Loss (lbs): 36 lb (16.3 kg) Peak Weight: 270 lb   Body Composition  Body Fat %: 38.9 % Fat Mass (lbs): 87 lbs Muscle Mass (lbs): 129.6 lbs Total Body Water (lbs): 89.2 lbs Visceral Fat Rating : 11   Other Clinical Data Fasting: No Labs: No Today's Visit #: 29 Starting Date: 06/08/22    Total Weight Loss: 36 pounds  Percent of body weight lost: 13.9% Bio Impedance Data reviewed with patient: Muscle is up 2.8 pounds, adipose is down 4.4 pounds. Visceral fat rating is down 1 point from 12 to 11.  HPI  Chief Complaint: OBESITY  Brandi Ortiz is here to discuss her progress with her obesity treatment plan. She is on the the Category 3 Plan and states she is following her eating plan approximately 50 % of the time. She states she is not currently exercising.    Interval History:  Since last office visit she has been doing more activity with great nephews and nieces. She has been trying to get 70-80 grams of protein daily Breakfast- breakfast sandwich 16,  Lunch- turkey sandwich Dinner: chicken with vegetables-broccoli or peas/corn She is getting about 16-32 ounces of water daily, no other drinks She is more motivated to do exercise- lives on 3rd floor but steps have been getting harder. She plans to do more cardio- she has run marathons in the past. Plans to start more running. She may plan goal to do 10K in [place] in 01/17/25.   She does have OSA and tries to use her CPAP 100% of the time.   She recently  increased her Cholecalciferol dose to 3000 units daily for Vit D deficiency Last vitamin D  Lab Results  Component Value Date   VD25OH 32.8 08/27/2024    Pharmacotherapy for weight loss: She is currently taking Zepbound  12.5 mg SQ once a week for medical weight loss.  Denies side effects.  She does take Vacia tea to prevent constipation.   She does have insulin  resistance but has improved with use of Zepbound  12.5 mg SQ once a week.  Her A1c is in low range but denies symptoms of hypoglycemia including dizziness, fatigue, feeling faint, and hot flashes.  Denies side effects. She makes sure she eats all her meals.  Her appetite is well controlled on Zepbound  12.5 mg SQ once a week. Lab Results  Component Value Date   HGBA1C 4.4 (L) 08/27/2024      PHYSICAL EXAM:  Blood pressure 103/70, pulse 82, temperature 98.4 F (36.9 C), height 5' 6 (1.676 m), weight 223 lb (101.2 kg), last menstrual period 03/23/2018, SpO2 98%. Body mass index is 35.99 kg/m.  General: Well Developed, well nourished, and in no acute distress.  HEENT: Normocephalic, atraumatic; EOMI, sclerae are anicteric. Skin: Warm and dry, good turgor Chest:  Normal excursion, shape, no gross ABN Respiratory: No conversational dyspnea; speaking in full sentences NeuroM-Sk:  Normal gross ROM * 4 extremities  Psych: A and O X 3, insight adequate, mood- full  DIAGNOSTIC DATA REVIEWED:  BMET    Component Value Date/Time   NA 139 08/27/2024 0854   K 4.0 08/27/2024 0854   CL 103 08/27/2024 0854   CO2 21 08/27/2024 0854   GLUCOSE 78 08/27/2024 0854   GLUCOSE 86 12/11/2023 1100   BUN 20 08/27/2024 0854   CREATININE 0.89 08/27/2024 0854   CALCIUM 8.9 08/27/2024 0854   GFRNONAA >60 12/11/2023 1100   GFRAA 85 11/13/2018 1447   Lab Results  Component Value Date   HGBA1C 4.4 (L) 08/27/2024   HGBA1C 4.5 (L) 04/15/2018   Lab Results  Component Value Date   INSULIN  16.8 08/27/2024   INSULIN  39.3 (H) 06/08/2022   Lab  Results  Component Value Date   TSH 1.420 08/27/2024   CBC    Component Value Date/Time   WBC 6.5 08/27/2024 0854   WBC 6.9 12/11/2023 1100   RBC 5.01 08/27/2024 0854   RBC 5.19 (H) 12/11/2023 1100   HGB 13.9 08/27/2024 0854   HCT 42.9 08/27/2024 0854   PLT 232 08/27/2024 0854   MCV 86 08/27/2024 0854   MCH 27.7 08/27/2024 0854   MCH 27.0 12/11/2023 1100   MCHC 32.4 08/27/2024 0854   MCHC 32.1 12/11/2023 1100   RDW 14.0 08/27/2024 0854   Iron Studies    Component Value Date/Time   IRON 74 11/14/2018 0000   FERRITIN 141.7 07/05/2020 1640   Lipid Panel     Component Value Date/Time   CHOL 142 08/27/2024 0854   TRIG 85 08/27/2024 0854   HDL 52 08/27/2024 0854   CHOLHDL 3 07/19/2021 1156   VLDL 26.2 07/19/2021 1156   LDLCALC 74 08/27/2024 0854   Hepatic Function Panel     Component Value Date/Time   PROT 6.8 08/27/2024 0854   ALBUMIN 4.4 08/27/2024 0854   AST 30 08/27/2024 0854   ALT 29 08/27/2024 0854   ALKPHOS 143 (H) 08/27/2024 0854   BILITOT 0.6 08/27/2024 0854   BILIDIR 0.1 11/14/2018 0000      Component Value Date/Time   TSH 1.420 08/27/2024 0854   Nutritional Lab Results  Component Value Date   VD25OH 32.8 08/27/2024   VD25OH 30.2 03/11/2024   VD25OH 35.1 09/04/2023     ASSESSMENT AND PLAN Class 2 severe obesity with serious comorbidity and body mass index (BMI) of 36.0 to 36.9 in adult, unspecified obesity type Polyphagia TREATMENT PLAN FOR OBESITY:  Recommended Dietary Goals  [name] is currently in the action stage of change. As such, her goal is to continue weight management plan. She has agreed to the Category 3 Plan.  Behavioral Intervention  We discussed the following Behavioral Modification Strategies today: increasing water intake , continue to work on maintaining a reduced calorie state, getting the recommended amount of protein, incorporating whole foods, making healthy choices, staying well hydrated and practicing mindfulness  when eating., and increase protein intake, fibrous foods (25 grams per day for women, 30 grams for men) and water to improve satiety and decrease hunger signals. .    Recommended Physical Activity Goals  [name] has been advised to work up to 150 minutes of moderate intensity aerobic activity a week and strengthening exercises 2-3 times per week for cardiovascular health, weight loss maintenance and preservation of muscle mass.   She has agreed to Start aerobic activity with a goal of 150 minutes a week at moderate intensity.  and Combine aerobic and strengthening exercises for efficiency and improved cardiometabolic health.   Pharmacotherapy We discussed various medication  options to help Megham with her weight loss efforts and we both agreed to continue Zepbound  12.5 mg SQ once a week.  ASSOCIATED CONDITIONS ADDRESSED TODAY  Action/Plan   Vitamin D  deficiency       Low vitamin D  levels can be associated with adiposity and may result in leptin resistance and weight gain. Also associated with fatigue.        Currently on vitamin D  supplementation without any adverse effects such as nausea, vomiting or muscle weakness.    OSA (obstructive sleep apnea)       Use CPAP 100% of the time       Continued weight loss can decrease AHI        Insulin  resistance Continue Category 3  meal plan, limit simple carbohydrates. Increase water to at least 40 ounces daily. Continue increased fiber and lean protein Continue Zepbound  12.5 mg SQ QW Continue exercise with current goal of 150 minutes of moderate to high intensity exercise/week.   Class 2 severe obesity with serious comorbidity and body mass index (BMI) of 36.0 to 36.9 in adult, unspecified obesity type       See plan above -     Zepbound ; Inject 12.5 mg into the skin once a week.  Dispense: 2 mL; Refill: 0         Return in about 4 weeks (around 11/24/2024).SABRA She was informed of the importance of frequent follow up visits to  maximize her success with intensive lifestyle modifications for her multiple health conditions.   ATTESTASTION STATEMENTS:  Reviewed by clinician on day of visit: allergies, medications, problem list, medical history, surgical history, family history, social history, and previous encounter notes.     Lonell Liverpool ANP-C "

## 2024-10-28 ENCOUNTER — Other Ambulatory Visit (HOSPITAL_COMMUNITY): Payer: Self-pay

## 2024-11-25 ENCOUNTER — Other Ambulatory Visit (HOSPITAL_COMMUNITY): Payer: Self-pay

## 2024-11-25 ENCOUNTER — Ambulatory Visit (INDEPENDENT_AMBULATORY_CARE_PROVIDER_SITE_OTHER): Admitting: Physician Assistant

## 2024-11-25 ENCOUNTER — Encounter (INDEPENDENT_AMBULATORY_CARE_PROVIDER_SITE_OTHER): Payer: Self-pay | Admitting: Physician Assistant

## 2024-11-25 VITALS — BP 100/69 | HR 74 | Temp 97.9°F | Ht 66.0 in | Wt 220.0 lb

## 2024-11-25 DIAGNOSIS — E559 Vitamin D deficiency, unspecified: Secondary | ICD-10-CM

## 2024-11-25 DIAGNOSIS — K760 Fatty (change of) liver, not elsewhere classified: Secondary | ICD-10-CM

## 2024-11-25 DIAGNOSIS — Z6835 Body mass index (BMI) 35.0-35.9, adult: Secondary | ICD-10-CM

## 2024-11-25 DIAGNOSIS — E88819 Insulin resistance, unspecified: Secondary | ICD-10-CM

## 2024-11-25 DIAGNOSIS — R632 Polyphagia: Secondary | ICD-10-CM | POA: Diagnosis not present

## 2024-11-25 DIAGNOSIS — R0602 Shortness of breath: Secondary | ICD-10-CM

## 2024-11-25 DIAGNOSIS — E66812 Obesity, class 2: Secondary | ICD-10-CM

## 2024-11-25 DIAGNOSIS — R7989 Other specified abnormal findings of blood chemistry: Secondary | ICD-10-CM

## 2024-11-25 DIAGNOSIS — Z6836 Body mass index (BMI) 36.0-36.9, adult: Secondary | ICD-10-CM

## 2024-11-25 MED ORDER — ZEPBOUND 12.5 MG/0.5ML ~~LOC~~ SOAJ
12.5000 mg | SUBCUTANEOUS | 0 refills | Status: AC
  Filled 2024-11-25: qty 2, 28d supply, fill #0

## 2024-11-26 ENCOUNTER — Ambulatory Visit (INDEPENDENT_AMBULATORY_CARE_PROVIDER_SITE_OTHER): Admitting: Physician Assistant

## 2024-12-30 ENCOUNTER — Ambulatory Visit (INDEPENDENT_AMBULATORY_CARE_PROVIDER_SITE_OTHER): Admitting: Physician Assistant

## 2025-01-05 ENCOUNTER — Encounter: Admitting: Nurse Practitioner

## 2025-01-06 ENCOUNTER — Encounter: Admitting: Nurse Practitioner
